# Patient Record
Sex: Male | Born: 1955 | Race: White | Hispanic: No | Marital: Married | Smoking: Never smoker
Health system: Southern US, Community
[De-identification: ages and names within clinical notes are randomized; demographics above are authoritative.]

## PROBLEM LIST (undated history)

## (undated) DIAGNOSIS — M1990 Unspecified osteoarthritis, unspecified site: Secondary | ICD-10-CM

## (undated) DIAGNOSIS — I214 Non-ST elevation (NSTEMI) myocardial infarction: Secondary | ICD-10-CM

## (undated) DIAGNOSIS — C419 Malignant neoplasm of bone and articular cartilage, unspecified: Secondary | ICD-10-CM

## (undated) DIAGNOSIS — C719 Malignant neoplasm of brain, unspecified: Secondary | ICD-10-CM

## (undated) DIAGNOSIS — I10 Essential (primary) hypertension: Secondary | ICD-10-CM

## (undated) DIAGNOSIS — I63219 Cerebral infarction due to unspecified occlusion or stenosis of unspecified vertebral arteries: Secondary | ICD-10-CM

## (undated) DIAGNOSIS — E119 Type 2 diabetes mellitus without complications: Secondary | ICD-10-CM

## (undated) DIAGNOSIS — I6322 Cerebral infarction due to unspecified occlusion or stenosis of basilar arteries: Secondary | ICD-10-CM

## (undated) HISTORY — DX: Type 2 diabetes mellitus without complications: E11.9

## (undated) HISTORY — PX: NO PAST SURGERIES: SHX2092

## (undated) HISTORY — DX: Cerebral infarction due to unspecified occlusion or stenosis of unspecified vertebral artery: I63.219

## (undated) HISTORY — DX: Cerebral infarction due to unspecified occlusion or stenosis of basilar artery: I63.22

## (undated) HISTORY — DX: Essential (primary) hypertension: I10

---
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
## 2014-12-05 DIAGNOSIS — C419 Malignant neoplasm of bone and articular cartilage, unspecified: Secondary | ICD-10-CM

## 2014-12-05 DIAGNOSIS — C719 Malignant neoplasm of brain, unspecified: Secondary | ICD-10-CM

## 2014-12-05 HISTORY — DX: Malignant neoplasm of brain, unspecified: C71.9

## 2014-12-05 HISTORY — DX: Malignant neoplasm of bone and articular cartilage, unspecified: C41.9

## 2015-07-11 ENCOUNTER — Encounter: Payer: Self-pay | Admitting: *Deleted

## 2015-07-11 ENCOUNTER — Inpatient Hospital Stay: Payer: Medicaid Other

## 2015-07-11 ENCOUNTER — Inpatient Hospital Stay
Admission: EM | Admit: 2015-07-11 | Discharge: 2015-07-12 | DRG: 065 | Disposition: A | Discharge status: Home / Self Care | Payer: Medicaid Other | Attending: Internal Medicine | Admitting: Internal Medicine

## 2015-07-11 ENCOUNTER — Emergency Department: Payer: Medicaid Other

## 2015-07-11 ENCOUNTER — Inpatient Hospital Stay
Admit: 2015-07-11 | Discharge: 2015-07-11 | Disposition: A | Discharge status: Home / Self Care | Payer: Medicaid Other | Attending: Internal Medicine | Admitting: Internal Medicine

## 2015-07-11 DIAGNOSIS — G8194 Hemiplegia, unspecified affecting left nondominant side: Secondary | ICD-10-CM | POA: Diagnosis present

## 2015-07-11 DIAGNOSIS — I639 Cerebral infarction, unspecified: Secondary | ICD-10-CM | POA: Diagnosis not present

## 2015-07-11 DIAGNOSIS — I638 Other cerebral infarction: Secondary | ICD-10-CM | POA: Diagnosis present

## 2015-07-11 DIAGNOSIS — Z809 Family history of malignant neoplasm, unspecified: Secondary | ICD-10-CM

## 2015-07-11 DIAGNOSIS — E785 Hyperlipidemia, unspecified: Secondary | ICD-10-CM | POA: Diagnosis present

## 2015-07-11 DIAGNOSIS — Z833 Family history of diabetes mellitus: Secondary | ICD-10-CM | POA: Diagnosis not present

## 2015-07-11 DIAGNOSIS — E669 Obesity, unspecified: Secondary | ICD-10-CM | POA: Diagnosis present

## 2015-07-11 DIAGNOSIS — I119 Hypertensive heart disease without heart failure: Secondary | ICD-10-CM | POA: Diagnosis present

## 2015-07-11 DIAGNOSIS — Z6835 Body mass index (BMI) 35.0-35.9, adult: Secondary | ICD-10-CM

## 2015-07-11 DIAGNOSIS — Z8249 Family history of ischemic heart disease and other diseases of the circulatory system: Secondary | ICD-10-CM | POA: Diagnosis not present

## 2015-07-11 DIAGNOSIS — M1990 Unspecified osteoarthritis, unspecified site: Secondary | ICD-10-CM | POA: Diagnosis present

## 2015-07-11 HISTORY — DX: Unspecified osteoarthritis, unspecified site: M19.90

## 2015-07-11 LAB — DIFFERENTIAL
BASOS ABS: 0.1 10*3/uL (ref 0–0.1)
Basophils Relative: 1 %
EOS PCT: 3 %
Eosinophils Absolute: 0.3 10*3/uL (ref 0–0.7)
LYMPHS ABS: 3.3 10*3/uL (ref 1.0–3.6)
Lymphocytes Relative: 35 %
Monocytes Absolute: 1.2 10*3/uL — ABNORMAL HIGH (ref 0.2–1.0)
Monocytes Relative: 13 %
NEUTROS PCT: 48 %
Neutro Abs: 4.6 10*3/uL (ref 1.4–6.5)

## 2015-07-11 LAB — COMPREHENSIVE METABOLIC PANEL
ALBUMIN: 3.7 g/dL (ref 3.5–5.0)
ALK PHOS: 72 U/L (ref 38–126)
ALT: 18 U/L (ref 17–63)
ANION GAP: 5 (ref 5–15)
AST: 19 U/L (ref 15–41)
BUN: 21 mg/dL — AB (ref 6–20)
CO2: 25 mmol/L (ref 22–32)
CREATININE: 0.75 mg/dL (ref 0.61–1.24)
Calcium: 8.6 mg/dL — ABNORMAL LOW (ref 8.9–10.3)
Chloride: 109 mmol/L (ref 101–111)
GFR calc Af Amer: >60 mL/min (ref >60)
GFR calc non Af Amer: >60 mL/min (ref >60)
Glucose, Bld: 204 mg/dL — ABNORMAL HIGH (ref 65–99)
Potassium: 4.1 mmol/L (ref 3.5–5.1)
Sodium: 139 mmol/L (ref 135–145)
Total Bilirubin: 0.4 mg/dL (ref 0.3–1.2)
Total Protein: 7.2 g/dL (ref 6.5–8.1)

## 2015-07-11 LAB — PROTIME-INR
INR: 0.93
PROTHROMBIN TIME: 12.7 s (ref 11.4–15.0)

## 2015-07-11 LAB — APTT: aPTT: 32 seconds (ref 24–36)

## 2015-07-11 LAB — CBC
HEMATOCRIT: 40.2 % (ref 40.0–52.0)
Hemoglobin: 14.2 g/dL (ref 13.0–18.0)
MCH: 31.1 pg (ref 26.0–34.0)
MCHC: 35.2 g/dL (ref 32.0–36.0)
MCV: 88.3 fL (ref 80.0–100.0)
Platelets: 217 10*3/uL (ref 150–440)
RBC: 4.55 MIL/uL (ref 4.40–5.90)
RDW: 13.5 % (ref 11.5–14.5)
WBC: 9.5 10*3/uL (ref 3.8–10.6)

## 2015-07-11 LAB — LIPID PANEL
CHOL/HDL RATIO: 6.4 ratio
Cholesterol: 218 mg/dL — ABNORMAL HIGH (ref 0–200)
HDL: 34 mg/dL — ABNORMAL LOW (ref >40)
LDL Cholesterol: 158 mg/dL — ABNORMAL HIGH (ref 0–99)
Triglycerides: 128 mg/dL (ref <150)
VLDL: 26 mg/dL (ref 0–40)

## 2015-07-11 LAB — TROPONIN I

## 2015-07-11 LAB — HEMOGLOBIN A1C: HEMOGLOBIN A1C: 7.5 % — AB (ref 4.0–6.0)

## 2015-07-11 MED ORDER — ATORVASTATIN CALCIUM 20 MG PO TABS
40.0000 mg | ORAL_TABLET | Freq: Every day | ORAL | Status: DC
  Administered 2015-07-11: 19:00:00 40 mg via ORAL

## 2015-07-11 MED ORDER — ASPIRIN 81 MG PO CHEW
324.0000 mg | CHEWABLE_TABLET | Freq: Once | ORAL | Status: AC
  Administered 2015-07-11: 324 mg via ORAL
  Filled 2015-07-11: qty 4

## 2015-07-11 MED ORDER — ASPIRIN 81 MG PO CHEW
325.0000 mg | CHEWABLE_TABLET | Freq: Every day | ORAL | Status: DC
  Filled 2015-07-11: qty 4

## 2015-07-11 MED ORDER — STROKE: EARLY STAGES OF RECOVERY BOOK
Freq: Once | Status: AC
  Administered 2015-07-11: 18:00:00

## 2015-07-11 NOTE — Plan of Care (Signed)
Problem: Discharge/Transitional Outcomes Goal: Other Discharge Outcomes/Goals Outcome: Progressing Plan of care progress to goal for:  1. Discharge Plan in Place and Appropriate:         Patient has Plans to return Home with Wife. 2. Barriers:         No Learning Barriers.         Supportive Family. 3. Educational Plan:         Stroke Teaching laboratory technician. 4. Hemodynamically Stable:         VSS.         Labs WDL.         Tolerating Fluids. 5. Independent Mobility/functioning independent or with min:         Ambulating Independently. 6. Diet:         Healthy Heart Diet. Tolerating Well.  7. INR Monitor Plan Established:         PT INR WDL.

## 2015-07-11 NOTE — ED Notes (Signed)
Pt back from MRI 

## 2015-07-11 NOTE — ED Notes (Signed)
Patient transported to MRI 

## 2015-07-11 NOTE — Progress Notes (Signed)
PT Cancellation Note  Patient Details Name: Jacob Gonzales MRN: [id number] DOB: Aug 05, 1956   Cancelled Treatment:    Reason Eval/Treat Not Completed: Patient declined, no reason specified.  Has just admitted and asked for PT to see in the AM.   Ramond Dial 07/11/2015, 2:41 PM   Mee Hives, PT MS Acute Rehab Dept. Number: [facility] [id number] and [place] [phone]

## 2015-07-11 NOTE — H&P (Addendum)
[facility] NAME: Jacob Gonzales    MR#:  [id number]  DATE OF BIRTH:  1956/02/20  DATE OF ADMISSION:  07/11/2015  PRIMARY CARE PHYSICIAN: No primary care provider on file.   REQUESTING/REFERRING PHYSICIAN: Schaevitz  CHIEF COMPLAINT:   Chief Complaint  Patient presents with  . Headache  . Numbness    HISTORY OF PRESENT ILLNESS: Jacob Gonzales  is a 59 y.o. male with a known history of No medical issues-  Came with left side numbness and weakness, since he woke up in morning, last night 11 he was completely at his baseline. He had similar episode 1  Month ago- wjich lasted for 20 min, so he thought this time it will go away, but it did n;t. In ER he is noted to have some atenuation  Given as admission for more work upw/  PAST MEDICAL HISTORY:   Past Medical History  Diagnosis Date  . Arthritis     Bilateral shoulders, Hands, Knees    PAST SURGICAL HISTORY: History reviewed. No pertinent past surgical history.  SOCIAL HISTORY:  History  Substance Use Topics  . Smoking status: Never Smoker   . Smokeless tobacco: Not on file  . Alcohol Use: No    FAMILY HISTORY:  Family History  Problem Relation Age of Onset  . Heart attack Mother   . Heart attack Father   . Diabetes Mellitus II Father   . Kidney failure Father   . Uterine cancer Sister   . Heart attack Brother   . Heart attack Maternal Aunt   . Heart attack Maternal Uncle   . Heart attack Paternal Aunt   . Heart attack Paternal Uncle     DRUG ALLERGIES: No Known Allergies  REVIEW OF SYSTEMS:   CONSTITUTIONAL: No fever, fatigue or weakness.  EYES: No blurred or double vision.  EARS, NOSE, AND THROAT: No tinnitus or ear pain.  RESPIRATORY: No cough, shortness of breath, wheezing or hemoptysis.  CARDIOVASCULAR: No chest pain, orthopnea, edema.  GASTROINTESTINAL: No nausea, vomiting, diarrhea or abdominal pain.  GENITOURINARY: No dysuria, hematuria.   ENDOCRINE: No polyuria, nocturia,  HEMATOLOGY: No anemia, easy bruising or bleeding SKIN: No rash or lesion. MUSCULOSKELETAL: No joint pain or arthritis.   NEUROLOGIC: No tingling, left sided UL and LL numbness, weakness.  PSYCHIATRY: No anxiety or depression.   MEDICATIONS AT HOME:  Prior to Admission medications   Medication Sig Start Date End Date Taking? Authorizing Provider  ibuprofen (ADVIL,MOTRIN) 200 MG tablet Take 1,000 mg by mouth daily.   Yes Historical Provider, MD      PHYSICAL EXAMINATION:   VITAL SIGNS: Blood pressure 169/88, pulse 64, temperature 97.6 F (36.4 C), temperature source Oral, resp. rate 18, height '5\' 10"'$  (1.778 m), weight 113.399 kg (250 lb), SpO2 100 %.  GENERAL:  59 y.o.-year-old patient lying in the bed with no acute distress.  EYES: Pupils equal, round, reactive to light and accommodation. No scleral icterus. Extraocular muscles intact.  HEENT: Head atraumatic, normocephalic. Oropharynx and nasopharynx clear.  NECK:  Supple, no jugular venous distention. No thyroid enlargement, no tenderness.  LUNGS: Normal breath sounds bilaterally, no wheezing, rales,rhonchi or crepitation. No use of accessory muscles of respiration.  CARDIOVASCULAR: S1, S2 normal. No murmurs, rubs, or gallops.  ABDOMEN: Soft, nontender, nondistended. Bowel sounds present. No organomegaly or mass.  EXTREMITIES: No pedal edema, cyanosis, or clubbing.  NEUROLOGIC: Cranial nerves II through XII are intact. Muscle strength 5/5 in  right side, and 4/5 on left side extremities. Sensation intact. Gait not checked.  PSYCHIATRIC: The patient is alert and oriented x 3.  SKIN: No obvious rash, lesion, or ulcer.   LABORATORY PANEL:   CBC  Recent Labs Lab 07/11/15 0816  WBC 9.5  HGB 14.2  HCT 40.2  PLT 217  MCV 88.3  MCH 31.1  MCHC 35.2  RDW 13.5  LYMPHSABS 3.3  MONOABS 1.2*  EOSABS 0.3  BASOSABS 0.1    ------------------------------------------------------------------------------------------------------------------  Chemistries   Recent Labs Lab 07/11/15 0816  NA 139  K 4.1  CL 109  CO2 25  GLUCOSE 204*  BUN 21*  CREATININE 0.75  CALCIUM 8.6*  AST 19  ALT 18  ALKPHOS 72  BILITOT 0.4   ------------------------------------------------------------------------------------------------------------------ estimated creatinine clearance is 130.1 mL/min (by C-G formula based on Cr of 0.75). ------------------------------------------------------------------------------------------------------------------ No results for input(s): TSH, T4TOTAL, T3FREE, THYROIDAB in the last 72 hours.  Invalid input(s): FREET3   Coagulation profile  Recent Labs Lab 07/11/15 0816  INR 0.93   ------------------------------------------------------------------------------------------------------------------- No results for input(s): DDIMER in the last 72 hours. -------------------------------------------------------------------------------------------------------------------  Cardiac Enzymes  Recent Labs Lab 07/11/15 0816  TROPONINI <0.03   ------------------------------------------------------------------------------------------------------------------ Invalid input(s): POCBNP  ---------------------------------------------------------------------------------------------------------------  Urinalysis No results found for: COLORURINE, APPEARANCEUR, LABSPEC, PHURINE, GLUCOSEU, HGBUR, BILIRUBINUR, KETONESUR, PROTEINUR, UROBILINOGEN, NITRITE, LEUKOCYTESUR   RADIOLOGY: Ct Head Wo Contrast  07/11/2015   CLINICAL DATA:  Pt had a knot on posterior rt side of head (pt denies fall or trauma says knot is just his bone), took ibuprofen and went to bed. Pt woke this am with left sided facial numbness and left side weakness. Pt did walk into department. Pt brought to the ed by his son. Pt is speaking in  full sentences. Pt denies hx of stroke or seizure.  EXAM: CT HEAD WITHOUT CONTRAST  TECHNIQUE: Contiguous axial images were obtained from the base of the skull through the vertex without intravenous contrast.  COMPARISON:  None.  FINDINGS: No evidence of transcortical infarct. No hemorrhage or extra-axial fluid. No hydrocephalus. Calvarium intact. 5 mm focus of low attenuation in the region of the external capsule on the right. Minimal low attenuation in the deep white matter otherwise. Mild atrophy.  IMPRESSION: 5 mm low-attenuation focus deep white matter on the right. Cannot exclude lacunar infarct of uncertain age. Otherwise no acute findings. These results were called by telephone at the time of interpretation on 07/11/2015 at 8:44 am to Dr. Lenise Arena , who verbally acknowledged these results.   Electronically Signed   By: Skipper Cliche M.D.   On: 07/11/2015 08:45   Mr Brain Wo Contrast  07/11/2015   CLINICAL DATA:  LEFT-sided numbness in the arm and leg. Stumbling. Headache. Patient awoke today with the symptoms.  EXAM: MRI HEAD WITHOUT CONTRAST  TECHNIQUE: Multiplanar, multiecho pulse sequences of the brain and surrounding structures were obtained without intravenous contrast.  COMPARISON:  CT head earlier today.  FINDINGS: The area of concern on CT represents a chronic RIGHT lentiform nucleus lacunar infarct. There is also a tiny chronic RIGHT thalamic infarct.  Superimposed on these changes is a 6 x 12 mm area of restricted diffusion in the RIGHT thalamus consistent with acute infarction. There is no associated hemorrhage, mass lesion, hydrocephalus, or extra-axial fluid.  Slight premature for age cerebral and cerebellar atrophy. Minor white matter disease, nonspecific. Normal midline structures. Flow voids are maintained.  Extracranial soft tissues unremarkable. Trace LEFT mastoid effusion without nasopharyngeal mass. No sinus air-fluid  level. Negative orbits.  IMPRESSION: Acute RIGHT thalamic  infarction accounting for the LEFT-sided sensory symptoms.  Chronic lacunar infarcts are superimposed.   Electronically Signed   By: Staci Righter M.D.   On: 07/11/2015 10:57   US Carotid Bilateral  07/11/2015   CLINICAL DATA:  Stroke, syncope  EXAM: BILATERAL CAROTID DUPLEX ULTRASOUND  TECHNIQUE: Pearline Cables scale imaging, color Doppler and duplex ultrasound were performed of bilateral carotid and vertebral arteries in the neck.  COMPARISON:  None  FINDINGS: Criteria: Quantification of carotid stenosis is based on velocity parameters that correlate the residual internal carotid diameter with NASCET-based stenosis levels, using the diameter of the distal internal carotid lumen as the denominator for stenosis measurement.  The following velocity measurements were obtained:  RIGHT  ICA:  92/37 cm/sec  CCA:  41/28 cm/sec  SYSTOLIC ICA/CCA RATIO:  1.2  DIASTOLIC ICA/CCA RATIO:  2.1  ECA:  91 cm/sec  LEFT  ICA:  122/53 cm/sec  CCA:  78/67 cm/sec  SYSTOLIC ICA/CCA RATIO:  1.4  DIASTOLIC ICA/CCA RATIO:  2.3  ECA:  129 cm/sec  RIGHT CAROTID ARTERY: Mild tortuosity within the RIGHT carotid system. Intimal thickening with minimal amount of hypoechoic plaque at RIGHT carotid bulb. Laminar flow on color Doppler imaging. Minimal spectral broadening proximal RIGHT ICA. No high velocity jets.  RIGHT VERTEBRAL ARTERY:  Patent, antegrade  LEFT CAROTID ARTERY: Mildly tortuous LEFT carotid system. Minimal intimal thickening and non shadowing plaque at LEFT carotid bulb. Laminar flow by color upper imaging with minimal spectral broadening at proximal LEFT ICA on waveform analysis. No high velocity jets.  LEFT VERTEBRAL ARTERY:  Patent, antegrade  IMPRESSION: Minimal intimal thickening and plaque formation at the carotid bulbs bilaterally.  No evidence of hemodynamically significant stenosis.   Electronically Signed   By: Lavonia Dana M.D.   On: 07/11/2015 19:14    EKG: Orders placed or performed during the hospital encounter of 07/11/15   . ED EKG  . ED EKG    IMPRESSION AND PLAN:  * CVA   Cardiac monitoring   Check Lipids, HBA1c   Check MRI,Echo and carotid doppler.   Give asa and statin.   PT and neurology eval.  * Hypertension   permisive due to new stroke,monitor.  * Obasity   councelled for weight loss.  All the records are reviewed and case discussed with ED provider. Management plans discussed with the patient, family and they are in agreement.  CODE STATUS:    Code Status Orders        Start     Ordered   07/11/15 1056  Full code   Continuous     07/11/15 1055       TOTAL TIME TAKING CARE OF THIS PATIENT: 50 minutes.    Vaughan Basta M.D on 07/11/2015   Between 7am to 6pm - Pager - [phone]  After 6pm go to [email or web address] - password [email or web address] [facility]  [facility]  Office  [phone]  CC: Primary care physician; No primary care provider on file.

## 2015-07-11 NOTE — ED Provider Notes (Addendum)
[facility] Emergency Department Provider Note  ____________________________________________  Time seen: Approximately 8:40 AM  I have reviewed the triage vital signs and the nursing notes.   HISTORY  Chief Complaint Headache and Numbness    HPI Jacob Gonzales is a 59 y.o. male without any medical problems who presents today with left-sided numbness and a stumbling gait. He said that he had a mild headache to the right occiput last night. He said he took some ibuprofen beforegoing to bed last night at 11 PM. He says that he woke this morning with left-sided numbness and a stumbling gait. No pain at this time. Says that he does not have any medical problems but is not into a doctor in many years. Does not take any medications at home.   No past medical history on file.  There are no active problems to display for this patient.   No past surgical history on file.  No current outpatient prescriptions on file.  Allergies Review of patient's allergies indicates no known allergies.  No family history on file.  Social History History  Substance Use Topics  . Smoking status: Never Smoker   . Smokeless tobacco: Not on file  . Alcohol Use: Yes    Review of Systems Constitutional: No fever/chills Eyes: No visual changes. ENT: No sore throat. Cardiovascular: Denies chest pain. Respiratory: Denies shortness of breath. Gastrointestinal: No abdominal pain.  No nausea, no vomiting.  No diarrhea.  No constipation. Genitourinary: Negative for dysuria. Musculoskeletal: Negative for back pain. Skin: Negative for rash. Neurological: As above  10-point ROS otherwise negative.  ____________________________________________   PHYSICAL EXAM:  VITAL SIGNS: ED Triage Vitals  Enc Vitals Group     BP 07/11/15 0808 183/97 mmHg     Pulse Rate 07/11/15 0808 71     Resp 07/11/15 0808 17     Temp 07/11/15 0808 98 F (36.7 C)     Temp Source 07/11/15 0808 Oral    SpO2 07/11/15 0808 98 %     Weight 07/11/15 0808 250 lb (113.399 kg)     Height 07/11/15 0808 '5\' 10"'$  (1.778 m)     Head Cir --      Peak Flow --      Pain Score --      Pain Loc --      Pain Edu? --      Excl. in [place]? --     Constitutional: Alert and oriented. Well appearing and in no acute distress. Eyes: Conjunctivae are normal. PERRL. EOMI. Head: Atraumatic. Nose: No congestion/rhinnorhea. Mouth/Throat: Mucous membranes are moist.  Oropharynx non-erythematous. Neck: No stridor.   Cardiovascular: Normal rate, regular rhythm. Grossly normal heart sounds.  Good peripheral circulation. Respiratory: Normal respiratory effort.  No retractions. Lungs CTAB. Gastrointestinal: Soft and nontender. No distention. No abdominal bruits. No CVA tenderness. Musculoskeletal: No lower extremity tenderness nor edema.  No joint effusions. Neurologic:  Normal speech and language. Decreased sensation to light touch to the left side of the face as well as the left upper extremity left lower extremity. No ataxia on finger to nose testing however does have slowed movement of the left upper extremity on finger to nose testing. Also with a stumbling gait. Needs assistance to walk. Skin:  Skin is warm, dry and intact. No rash noted. Psychiatric: Mood and affect are normal. Speech and behavior are normal.  ____________________________________________   LABS (all labs ordered are listed, but only abnormal results are displayed)  Labs Reviewed  DIFFERENTIAL -  Abnormal; Notable for the following:    Monocytes Absolute 1.2 (*)    All other components within normal limits  PROTIME-INR  APTT  CBC  COMPREHENSIVE METABOLIC PANEL  TROPONIN I   ____________________________________________  EKG  ED ECG REPORT I, [place] Every,  Youlanda Roys, the attending physician, personally viewed and interpreted this ECG.   Date: 07/11/2015  EKG Time: 808  Rate: 68  Rhythm: normal sinus rhythm  Axis: Normal axis   Intervals:Incomplete right bundle branch block  ST&T Change: No ST elevations or depressions. No abnormal T-wave inversions.  ____________________________________________  RADIOLOGY  CAT scan of the brain with 5 mm low attenuation focus deep white matter on the right. Cannot exclude lacunar infarct of uncertain age. ____________________________________________   PROCEDURES  Patient is out of window for TPA. His last time that he was without his neurologic findings was 11 PM last night.  ____________________________________________   INITIAL IMPRESSION / ASSESSMENT AND PLAN / ED COURSE  Pertinent labs & imaging results that were available during my care of the patient were reviewed by me and considered in my medical decision making (see chart for details).  ----------------------------------------- 9:06 AM on 07/11/2015 -----------------------------------------  Due to exam findings as well as CAT scan findings will admit the patient for CVA workup. Signed out to Dr. Anselm Jungling. Patient and son aware of results and agree with plan. Signed out to Dr. Anselm Jungling. ____________________________________________   FINAL CLINICAL IMPRESSION(S) / ED DIAGNOSES  Acute CVA. Initial visit.    Orbie Pyo, MD 07/11/15 [phone]  NIH Stroke Scale   Time: 840AM Person Administering Scale: Doran Stabler  Administer stroke scale items in the order listed. Record performance in each category after each subscale exam. Do not go back and change scores. Follow directions provided for each exam technique. Scores should reflect what the patient does, not what the clinician thinks the patient can do. The clinician should record answers while administering the exam and work quickly. Except where indicated, the patient should not be coached (i.e., repeated requests to patient to make a special effort).   1a  Level of consciousness: 0=alert; keenly responsive  1b. LOC questions:   0=Performs both tasks correctly  1c. LOC commands: 0=Performs both tasks correctly  2.  Best Gaze: 0=normal  3.  Visual: 0=No visual loss  4. Facial Palsy: 0=Normal symmetric movement  5a.  Motor left arm: 0=No drift, limb holds 90 (or 45) degrees for full 10 seconds  5b.  Motor right arm: 0=No drift, limb holds 90 (or 45) degrees for full 10 seconds  6a. motor left leg: 0=No drift, limb holds 90 (or 45) degrees for full 10 seconds  6b  Motor right leg:  0=No drift, limb holds 90 (or 45) degrees for full 10 seconds  7. Limb Ataxia: 0=Absent  8.  Sensory: 1=Mild to moderate sensory loss; patient feels pinprick is less sharp or is dull on the affected side; there is a loss of superficial pain with pinprick but patient is aware He is being touched  9. Best Language:  0=No aphasia, normal  10. Dysarthria: 0=Normal  11. Extinction and Inattention: 0=No abnormality  12. Distal motor function: 0=Normal   Total:   1    Orbie Pyo, MD 07/11/15 1005

## 2015-07-11 NOTE — ED Notes (Signed)
Ed md and charge made aware

## 2015-07-11 NOTE — ED Notes (Addendum)
Pt resting in wheel chair in nad. Son present pt conversant with his son.

## 2015-07-11 NOTE — ED Notes (Signed)
Pt had a knot on posterior rt side of head, took ibuprofen and went to bed. Pt woke this am with left sided facial numbness and left side weakness. Pt did walk into department. Pt brought to the ed by his son. Pt is speaking in full sentences.

## 2015-07-12 DIAGNOSIS — I63312 Cerebral infarction due to thrombosis of left middle cerebral artery: Secondary | ICD-10-CM

## 2015-07-12 LAB — BASIC METABOLIC PANEL
ANION GAP: 7 (ref 5–15)
BUN: 15 mg/dL (ref 6–20)
CO2: 26 mmol/L (ref 22–32)
Calcium: 8.6 mg/dL — ABNORMAL LOW (ref 8.9–10.3)
Chloride: 104 mmol/L (ref 101–111)
Creatinine, Ser: 0.66 mg/dL (ref 0.61–1.24)
GFR calc Af Amer: >60 mL/min (ref >60)
Glucose, Bld: 127 mg/dL — ABNORMAL HIGH (ref 65–99)
Potassium: 3.7 mmol/L (ref 3.5–5.1)
Sodium: 137 mmol/L (ref 135–145)

## 2015-07-12 LAB — CBC
HCT: 39.7 % — ABNORMAL LOW (ref 40.0–52.0)
Hemoglobin: 14 g/dL (ref 13.0–18.0)
MCH: 31.2 pg (ref 26.0–34.0)
MCHC: 35.3 g/dL (ref 32.0–36.0)
MCV: 88.3 fL (ref 80.0–100.0)
Platelets: 201 10*3/uL (ref 150–440)
RBC: 4.5 MIL/uL (ref 4.40–5.90)
RDW: 13.3 % (ref 11.5–14.5)
WBC: 10.2 10*3/uL (ref 3.8–10.6)

## 2015-07-12 MED ORDER — ATORVASTATIN CALCIUM 40 MG PO TABS: 40.0000 mg | ORAL_TABLET | Freq: Every day | ORAL | Status: DC

## 2015-07-12 MED ORDER — LISINOPRIL 10 MG PO TABS
10.0000 mg | ORAL_TABLET | Freq: Every day | ORAL | Status: DC
  Administered 2015-07-12: 11:00:00 10 mg via ORAL
  Filled 2015-07-12: qty 1

## 2015-07-12 MED ORDER — ASPIRIN 81 MG PO CHEW
81.0000 mg | CHEWABLE_TABLET | Freq: Every day | ORAL | Status: DC
  Administered 2015-07-12: 11:00:00 81 mg via ORAL
  Filled 2015-07-12: qty 1

## 2015-07-12 MED ORDER — LISINOPRIL 10 MG PO TABS: 10.0000 mg | ORAL_TABLET | Freq: Every day | ORAL | Status: DC

## 2015-07-12 MED ORDER — ASPIRIN 81 MG PO CHEW: 81.0000 mg | CHEWABLE_TABLET | Freq: Every day | ORAL | Status: DC

## 2015-07-12 NOTE — Consult Note (Signed)
Chief Complaint: L sided nubmness  HPI: Jacob Gonzales is an 59 y.o. male  known history of HTN no medications caame with left side numbness and weakness, since he woke up in morning. Pt found ischemic stroke in R thalamus. No prior hx of similar symptoms. Strength improved but still complaining of numbness on LUE and LLE. Was not on any antiplatelet therapy.     Past Medical History  Diagnosis Date  . Arthritis     Bilateral shoulders, Hands, Knees    History reviewed. No pertinent past surgical history.  Family History  Problem Relation Age of Onset  . Heart attack Mother   . Heart attack Father   . Diabetes Mellitus II Father   . Kidney failure Father   . Uterine cancer Sister   . Heart attack Brother   . Heart attack Maternal Aunt   . Heart attack Maternal Uncle   . Heart attack Paternal Aunt   . Heart attack Paternal Uncle    Social History:  reports that he has never smoked. He does not have any smokeless tobacco history on file. He reports that he does not drink alcohol or use illicit drugs.  Allergies: No Known Allergies  Medications: I have reviewed the patient's current medications.  ROS: History obtained from the patient  General ROS: negative for - chills, fatigue, fever, night sweats, weight gain or weight loss Psychological ROS: negative for - behavioral disorder, hallucinations, memory difficulties, mood swings or suicidal ideation Ophthalmic ROS: negative for - blurry vision, double vision, eye pain or loss of vision ENT ROS: negative for - epistaxis, nasal discharge, oral lesions, sore throat, tinnitus or vertigo Allergy and Immunology ROS: negative for - hives or itchy/watery eyes Hematological and Lymphatic ROS: negative for - bleeding problems, bruising or swollen lymph nodes Endocrine ROS: negative for - galactorrhea, hair pattern changes, polydipsia/polyuria or temperature intolerance Respiratory ROS: negative for - cough, hemoptysis, shortness of  breath or wheezing Cardiovascular ROS: negative for - chest pain, dyspnea on exertion, edema or irregular heartbeat Gastrointestinal ROS: negative for - abdominal pain, diarrhea, hematemesis, nausea/vomiting or stool incontinence Genito-Urinary ROS: negative for - dysuria, hematuria, incontinence or urinary frequency/urgency Musculoskeletal ROS: negative for - joint swelling or muscular weakness Neurological ROS: as noted in HPI Dermatological ROS: negative for rash and skin lesion changes  Physical Examination: Blood pressure 160/76, pulse 77, temperature 98.6 F (37 C), temperature source Oral, resp. rate 18, height '5\' 10"'$  (1.778 m), weight 113.399 kg (250 lb), SpO2 98 %.    Neurological Examination Mental Status: Alert, oriented, thought content appropriate.  Speech fluent without evidence of aphasia.  Able to follow 3 step commands without difficulty. Cranial Nerves: II: Discs flat bilaterally; Visual fields grossly normal, pupils equal, round, reactive to light and accommodation III,IV, VI: ptosis not present, extra-ocular motions intact bilaterally V,VII: smile symmetric, facial light touch sensation normal bilaterally VIII: hearing normal bilaterally IX,X: gag reflex present XI: bilateral shoulder shrug XII: midline tongue extension Motor: Right : Upper extremity   5/5    Left:     Upper extremity   4+/5  Lower extremity   5/5     Lower extremity   4+/5 Tone and bulk:normal tone throughout; no atrophy noted Sensory: decreased sensation to light touch and temperature on L side.  Deep Tendon Reflexes: 1+ and symmetric throughout Plantars: Right: downgoing   Left: downgoing Cerebellar: normal finger-to-nose, normal rapid alternating movements and normal heel-to-shin test Gait: not tested  Laboratory Studies:  Basic Metabolic Panel:  Recent Labs Lab 07/11/15 0816 07/12/15 0421  NA 139 137  K 4.1 3.7  CL 109 104  CO2 25 26  GLUCOSE 204* 127*  BUN 21* 15   CREATININE 0.75 0.66  CALCIUM 8.6* 8.6*    Liver Function Tests:  Recent Labs Lab 07/11/15 0816  AST 19  ALT 18  ALKPHOS 72  BILITOT 0.4  PROT 7.2  ALBUMIN 3.7   No results for input(s): LIPASE, AMYLASE in the last 168 hours. No results for input(s): AMMONIA in the last 168 hours.  CBC:  Recent Labs Lab 07/11/15 0816 07/12/15 0421  WBC 9.5 10.2  NEUTROABS 4.6  --   HGB 14.2 14.0  HCT 40.2 39.7*  MCV 88.3 88.3  PLT 217 201    Cardiac Enzymes:  Recent Labs Lab 07/11/15 0816  TROPONINI <0.03    BNP: Invalid input(s): POCBNP  CBG: No results for input(s): GLUCAP in the last 168 hours.  Microbiology: No results found for this or any previous visit.  Coagulation Studies:  Recent Labs  07/11/15 0816  LABPROT 12.7  INR 0.93    Urinalysis: No results for input(s): COLORURINE, LABSPEC, PHURINE, GLUCOSEU, HGBUR, BILIRUBINUR, KETONESUR, PROTEINUR, UROBILINOGEN, NITRITE, LEUKOCYTESUR in the last 168 hours.  Invalid input(s): APPERANCEUR  Lipid Panel:    Component Value Date/Time   CHOL 218* 07/11/2015 0816   TRIG 128 07/11/2015 0816   HDL 34* 07/11/2015 0816   CHOLHDL 6.4 07/11/2015 0816   VLDL 26 07/11/2015 0816   LDLCALC 158* 07/11/2015 0816    HgbA1C:  Lab Results  Component Value Date   HGBA1C 7.5* 07/11/2015    Urine Drug Screen:  No results found for: LABOPIA, COCAINSCRNUR, LABBENZ, AMPHETMU, THCU, LABBARB  Alcohol Level: No results for input(s): ETH in the last 168 hours.  Other results: EKG: normal EKG, normal sinus rhythm, unchanged from previous tracings.  Imaging: Ct Head Wo Contrast  07/11/2015   CLINICAL DATA:  Pt had a knot on posterior rt side of head (pt denies fall or trauma says knot is just his bone), took ibuprofen and went to bed. Pt woke this am with left sided facial numbness and left side weakness. Pt did walk into department. Pt brought to the ed by his son. Pt is speaking in full sentences. Pt denies hx of stroke or  seizure.  EXAM: CT HEAD WITHOUT CONTRAST  TECHNIQUE: Contiguous axial images were obtained from the base of the skull through the vertex without intravenous contrast.  COMPARISON:  None.  FINDINGS: No evidence of transcortical infarct. No hemorrhage or extra-axial fluid. No hydrocephalus. Calvarium intact. 5 mm focus of low attenuation in the region of the external capsule on the right. Minimal low attenuation in the deep white matter otherwise. Mild atrophy.  IMPRESSION: 5 mm low-attenuation focus deep white matter on the right. Cannot exclude lacunar infarct of uncertain age. Otherwise no acute findings. These results were called by telephone at the time of interpretation on 07/11/2015 at 8:44 am to Dr. Lenise Arena , who verbally acknowledged these results.   Electronically Signed   By: Skipper Cliche M.D.   On: 07/11/2015 08:45   Mr Brain Wo Contrast  07/11/2015   CLINICAL DATA:  LEFT-sided numbness in the arm and leg. Stumbling. Headache. Patient awoke today with the symptoms.  EXAM: MRI HEAD WITHOUT CONTRAST  TECHNIQUE: Multiplanar, multiecho pulse sequences of the brain and surrounding structures were obtained without intravenous contrast.  COMPARISON:  CT head  earlier today.  FINDINGS: The area of concern on CT represents a chronic RIGHT lentiform nucleus lacunar infarct. There is also a tiny chronic RIGHT thalamic infarct.  Superimposed on these changes is a 6 x 12 mm area of restricted diffusion in the RIGHT thalamus consistent with acute infarction. There is no associated hemorrhage, mass lesion, hydrocephalus, or extra-axial fluid.  Slight premature for age cerebral and cerebellar atrophy. Minor white matter disease, nonspecific. Normal midline structures. Flow voids are maintained.  Extracranial soft tissues unremarkable. Trace LEFT mastoid effusion without nasopharyngeal mass. No sinus air-fluid level. Negative orbits.  IMPRESSION: Acute RIGHT thalamic infarction accounting for the LEFT-sided  sensory symptoms.  Chronic lacunar infarcts are superimposed.   Electronically Signed   By: Staci Righter M.D.   On: 07/11/2015 10:57   US Carotid Bilateral  07/11/2015   CLINICAL DATA:  Stroke, syncope  EXAM: BILATERAL CAROTID DUPLEX ULTRASOUND  TECHNIQUE: Pearline Cables scale imaging, color Doppler and duplex ultrasound were performed of bilateral carotid and vertebral arteries in the neck.  COMPARISON:  None  FINDINGS: Criteria: Quantification of carotid stenosis is based on velocity parameters that correlate the residual internal carotid diameter with NASCET-based stenosis levels, using the diameter of the distal internal carotid lumen as the denominator for stenosis measurement.  The following velocity measurements were obtained:  RIGHT  ICA:  92/37 cm/sec  CCA:  63/87 cm/sec  SYSTOLIC ICA/CCA RATIO:  1.2  DIASTOLIC ICA/CCA RATIO:  2.1  ECA:  91 cm/sec  LEFT  ICA:  122/53 cm/sec  CCA:  56/43 cm/sec  SYSTOLIC ICA/CCA RATIO:  1.4  DIASTOLIC ICA/CCA RATIO:  2.3  ECA:  129 cm/sec  RIGHT CAROTID ARTERY: Mild tortuosity within the RIGHT carotid system. Intimal thickening with minimal amount of hypoechoic plaque at RIGHT carotid bulb. Laminar flow on color Doppler imaging. Minimal spectral broadening proximal RIGHT ICA. No high velocity jets.  RIGHT VERTEBRAL ARTERY:  Patent, antegrade  LEFT CAROTID ARTERY: Mildly tortuous LEFT carotid system. Minimal intimal thickening and non shadowing plaque at LEFT carotid bulb. Laminar flow by color upper imaging with minimal spectral broadening at proximal LEFT ICA on waveform analysis. No high velocity jets.  LEFT VERTEBRAL ARTERY:  Patent, antegrade  IMPRESSION: Minimal intimal thickening and plaque formation at the carotid bulbs bilaterally.  No evidence of hemodynamically significant stenosis.   Electronically Signed   By: Lavonia Dana M.D.   On: 07/11/2015 19:14     Assessment/Plan:  59 y.o. male  known history of HTN no medications caame with left side numbness and weakness,  since he woke up in morning. Pt found ischemic stroke in R thalamus. No prior hx of similar symptoms. Strength improved but still complaining of numbness on LUE and LLE. Was not on any antiplatelet therapy.    - ASA and statin daily. Likely small vessel dz in setting of HTN - 2d echo, Carotid - lipid panel - after above complete d/c planning and f/up neurology 4-6 weeks.  Leotis Pain  07/12/2015, 11:49 AM

## 2015-07-12 NOTE — Discharge Instructions (Signed)
Ischemic Stroke Blood carries oxygen to all areas of your body. A stroke happens when your blood does not flow to your brain like normal. If this happens, your brain will not get the oxygen it needs and brain tissue will die. This is an emergency. Problems (symptoms) of a stroke usually happen suddenly. You may notice them when you wake up. They can include:  Loss of feeling or weakness on one side of the body (face, arm, leg).  Feeling confused.  Trouble talking or understanding.  Trouble seeing.  Trouble walking.  Feeling dizzy.  Loss of balance or coordination.  Severe headache without a cause.  Trouble reading or writing. Get help as soon as any of these problems first start. This is important.  RISK FACTORS  Risk factors are things that make it more likely for you to have a stroke. These things include:  High blood pressure (hypertension).  High cholesterol.  Diabetes.  Heart disease.  Having a buildup of fatty deposits in the blood vessels.  Having an abnormal heart rhythm (atrial fibrillation).  Being very overweight (obese).  Smoking.  Taking birth control pills, especially if you smoke.  **Not being active.  **Having a diet high in fats, salt, and calories.  Drinking too much alcohol.  Using illegal drugs.  Being African American.  Being over the age of 55.  Having a family history of stroke.  Having a history of blood clots, stroke, warning stroke (transient ischemic attack, TIA), or heart attack.  Sickle cell disease. HOME CARE  Take all medicines exactly as told by your doctor. Understand all your medicine instructions.  You may need to take a medicine to thin your blood, like aspirin or warfarin. Take warfarin exactly as told.  Taking too much or too little warfarin is dangerous. Get regular blood tests as told, including the PT and INR tests. The test results help your doctor adjust your dose of warfarin. Your PT and INR levels must be  done as often as told by your doctor.  Food can cause problems with warfarin and affect the results of your blood tests. This is true for foods high in vitamin K, such as spinach, kale, broccoli, cabbage, collard and turnip greens, Brussels sprouts, peas, cauliflower, seaweed, and parsley, as well as beef and pork liver, green tea, and soybean oil. Eat the same amount of food high in vitamin K. Avoid major changes in your diet. Tell your doctor before changing your diet. Talk to a food specialist (dietitian) if you have questions.  Many medicines can cause problems with warfarin and affect your PT and INR test results. Tell your doctor about all medicines you take. This includes vitamins and dietary pills (supplements). Be careful with aspirin and medicines that relieve redness, soreness, and puffiness (inflammation). Do not take or stop medicines unless your doctor tells you to.  Warfarin can cause a lot of bruising or bleeding. Hold pressure over cuts for longer than normal. Talk to your doctor about other side effects of warfarin.  Avoid sports or activities that may cause injury or bleeding.  Be careful when you shave, floss your teeth, or use sharp objects.  Avoid alcoholic drinks or drink very little alcohol while taking warfarin. Tell your doctor if you change how much alcohol you drink.  Tell your dentist and other doctors that you take warfarin before procedures.  If you are able to swallow, eat healthy foods. Eat 5 or more servings of fruits and vegetables a day. Eat soft   foods, pureed foods, or eat small bites of food so you do not choke.  Follow your diet program as told, if you are given one.  Keep a healthy weight.  Stay active. Try to get at least 30 minutes of activity on most or all days.  Do not smoke.  Limit how much alcohol you drink even if you are not taking warfarin. Moderate alcohol use is:  No more than 2 drinks each day for men.  No more than 1 drink each day  for women who are not pregnant.  Keep your home safe so you do not fall. Try:  Putting grab bars in the bedroom and bathroom.  Raising toilet seats.  Putting a seat in the shower.  Go to therapy sessions (physical, occupational, and speech) as told by your doctor.  Use a walker or cane at all times if told to do so.  Keep all doctor visits as told. GET HELP IF:  Your personality changes.  You have trouble swallowing.  You are seeing two of everything.  You are dizzy.  You have a fever.  Your skin starts to break down. GET HELP RIGHT AWAY IF:  The symptoms below may be a sign of an emergency. Do not wait to see if the symptoms go away. Call for help (911 in [place] Do not drive yourself to the hospital.  You have sudden weakness or numbness on the face, arm, or leg (especially on one side of the body).  You have sudden trouble walking or moving your arms or legs.  You have sudden confusion.  You have trouble talking or understanding.  You have sudden trouble seeing in one or both eyes.  You lose your balance or your movements are not smooth.  You have a sudden, severe headache with no known cause.  You have new chest pain or you feel your heart beating in an unsteady way.  You are partly or totally unaware of what is going on around you. Document Released: 11/10/2011 Document Revised: 04/07/2014 Document Reviewed: 07/01/2012 [employer] Patient Information 2015 [employer]. This information is not intended to replace advice given to you by your health care provider. Make sure you discuss any questions you have with your health care provider.  

## 2015-07-12 NOTE — Care Management Note (Addendum)
Case Management Note  Patient Details  Name: Jacob Gonzales MRN: [id number] Date of Birth: Jan 30, 1956  Subjective/Objective:            Discussed discharge planning with uninsured Jacob Gonzales who denies having seen any physician within the past 4 years. He reports that he has a rolling walker at home. This Probation officer explained to Jacob Gonzales that no home health agency will accept a patient without a physician to be on-call to write orders and because he has no surgeon or PCP that he will not be eligible for home health services. Strongly encouraged Jacob Gonzales to establish himself with a local doctor or clinic. Jacob Gonzales states that his wife gets disability and is trying to get him on her insurance. Provided Jacob Gonzales with a list of local medical clinics, a medication discount coupon, and an application to the [facility]. Have paged Dr Anselm Jungling twice in an attempt to update him.       Action/Plan:   Expected Discharge Date:                  Expected Discharge Plan:     In-House Referral:     Discharge planning Services     Post Acute Care Choice:    Choice offered to:     DME Arranged:    DME Agency:     HH Arranged:    [place] Agency:     Status of Service:     Medicare Important Message Given:    Date Medicare IM Given:    Medicare IM give by:    Date Additional Medicare IM Given:    Additional Medicare Important Message give by:     If discussed at [place] of Stay Meetings, dates discussed:    Additional Comments:  Arik Husmann A, RN 07/12/2015, 11:33 AM

## 2015-07-12 NOTE — Progress Notes (Signed)
Pt is alert and oriented x 4, denies pain, sleeping throughout shift, up to bathroom independently, vss, pt is d/c to home, patient anxious to leave and has little interest in nursing care and stroke education, pt d/c with Rx for aspirin, lisinopril and atorvastatin. Reviewed with patient when and why to take medications. Pt d/c to home via friend, pushed to visitor entrance via nursing staff. Pt reports understanding of d/c instructions and has no further questions at this time. Uneventful shift.

## 2015-07-12 NOTE — Discharge Summary (Signed)
[facility] NAME: Jacob Gonzales    MR#:  [id number]  DATE OF BIRTH:  [birth year]  DATE OF ADMISSION:  07/11/2015 ADMITTING PHYSICIAN: Vaughan Basta, MD  DATE OF DISCHARGE:07/12/2015  PRIMARY CARE PHYSICIAN: No primary care provider on file.    ADMISSION DIAGNOSIS:  CVA (cerebral infarction) [I63.9] Cerebral infarction due to unspecified mechanism [I63.9]  DISCHARGE DIAGNOSIS:  Principal Problem:   CVA (cerebral infarction)   Hypertensive cardiomyopathy  SECONDARY DIAGNOSIS:   Past Medical History  Diagnosis Date  . Arthritis     Bilateral shoulders, Hands, Knees    HOSPITAL COURSE:   * CVA-   Councelled for medical compliance- a he did not had PMD.   ASA, Statin started, started BP meds.  Need HOme health and rolling walker per PT>  * Hypertensive cardiomyopathy   Have EF 40-45%   Started on lisinopril- follow with PMD>  * Hyperlipidmeia   Started atorvastatin  * high HBA1c   Councelled about diet control and follow with PMD>  DISCHARGE CONDITIONS:   Stable.  CONSULTS OBTAINED:  Treatment Team:  Leotis Pain, MD  DRUG ALLERGIES:  No Known Allergies  DISCHARGE MEDICATIONS:   Current Discharge Medication List    START taking these medications   Details  aspirin 81 MG chewable tablet Chew 1 tablet (81 mg total) by mouth daily. Qty: 30 tablet, Refills: 3    atorvastatin (LIPITOR) 40 MG tablet Take 1 tablet (40 mg total) by mouth daily at 6 PM. Qty: 30 tablet, Refills: 3    lisinopril (PRINIVIL,ZESTRIL) 10 MG tablet Take 1 tablet (10 mg total) by mouth daily. Qty: 30 tablet, Refills: 3      CONTINUE these medications which have NOT CHANGED   Details  ibuprofen (ADVIL,MOTRIN) 200 MG tablet Take 1,000 mg by mouth daily.         DISCHARGE INSTRUCTIONS:    Follow with PMD in 1-2 weeks./  If you experience worsening of your admission symptoms, develop shortness of breath, life  threatening emergency, suicidal or homicidal thoughts you must seek medical attention immediately by calling 911 or calling your MD immediately  if symptoms less severe.  You Must read complete instructions/literature along with all the possible adverse reactions/side effects for all the Medicines you take and that have been prescribed to you. Take any new Medicines after you have completely understood and accept all the possible adverse reactions/side effects.   Please note  You were cared for by a hospitalist during your hospital stay. If you have any questions about your discharge medications or the care you received while you were in the hospital after you are discharged, you can call the unit and asked to speak with the hospitalist on call if the hospitalist that took care of you is not available. Once you are discharged, your primary care physician will handle any further medical issues. Please note that NO REFILLS for any discharge medications will be authorized once you are discharged, as it is imperative that you return to your primary care physician (or establish a relationship with a primary care physician if you do not have one) for your aftercare needs so that they can reassess your need for medications and monitor your lab values.    Today   CHIEF COMPLAINT:   Chief Complaint  Patient presents with  . Headache  . Numbness    HISTORY OF PRESENT ILLNESS:  Jacob Gonzales  is a 59 y.o. male with  a known history of No medical issues- Came with left side numbness and weakness, since he woke up in morning, last night 11 he was completely at his baseline. He had similar episode 1 Month ago- wjich lasted for 20 min, so he thought this time it will go away, but it did n;t. In ER he is noted to have some atenuation Given as admission for more work upw/  VITAL SIGNS:  Blood pressure 160/76, pulse 77, temperature 98.6 F (37 C), temperature source Oral, resp. rate 18, height '5\' 10"'$  (1.778  m), weight 113.399 kg (250 lb), SpO2 98 %.  I/O:   Intake/Output Summary (Last 24 hours) at 07/12/15 1139 Last data filed at 07/12/15 0800  Gross per 24 hour  Intake    480 ml  Output      0 ml  Net    480 ml    PHYSICAL EXAMINATION:  GENERAL: 59 y.o.-year-old patient lying in the bed with no acute distress.  EYES: Pupils equal, round, reactive to light and accommodation. No scleral icterus. Extraocular muscles intact.  HEENT: Head atraumatic, normocephalic. Oropharynx and nasopharynx clear.  NECK: Supple, no jugular venous distention. No thyroid enlargement, no tenderness.  LUNGS: Normal breath sounds bilaterally, no wheezing, rales,rhonchi or crepitation. No use of accessory muscles of respiration.  CARDIOVASCULAR: S1, S2 normal. No murmurs, rubs, or gallops.  ABDOMEN: Soft, nontender, nondistended. Bowel sounds present. No organomegaly or mass.  EXTREMITIES: No pedal edema, cyanosis, or clubbing.  NEUROLOGIC: Cranial nerves II through XII are intact. Muscle strength 5/5 in right side, and 4/5 on left side extremities. Sensation intact. Gait not checked.  PSYCHIATRIC: The patient is alert and oriented x 3.  SKIN: No obvious rash, lesion, or ulcer.   DATA REVIEW:   CBC  Recent Labs Lab 07/12/15 0421  WBC 10.2  HGB 14.0  HCT 39.7*  PLT 201    Chemistries   Recent Labs Lab 07/11/15 0816 07/12/15 0421  NA 139 137  K 4.1 3.7  CL 109 104  CO2 25 26  GLUCOSE 204* 127*  BUN 21* 15  CREATININE 0.75 0.66  CALCIUM 8.6* 8.6*  AST 19  --   ALT 18  --   ALKPHOS 72  --   BILITOT 0.4  --     Cardiac Enzymes  Recent Labs Lab 07/11/15 0816  TROPONINI <0.03    Microbiology Results  No results found for this or any previous visit.  RADIOLOGY:  Ct Head Wo Contrast  07/11/2015   CLINICAL DATA:  Pt had a knot on posterior rt side of head (pt denies fall or trauma says knot is just his bone), took ibuprofen and went to bed. Pt woke this am with left sided  facial numbness and left side weakness. Pt did walk into department. Pt brought to the ed by his son. Pt is speaking in full sentences. Pt denies hx of stroke or seizure.  EXAM: CT HEAD WITHOUT CONTRAST  TECHNIQUE: Contiguous axial images were obtained from the base of the skull through the vertex without intravenous contrast.  COMPARISON:  None.  FINDINGS: No evidence of transcortical infarct. No hemorrhage or extra-axial fluid. No hydrocephalus. Calvarium intact. 5 mm focus of low attenuation in the region of the external capsule on the right. Minimal low attenuation in the deep white matter otherwise. Mild atrophy.  IMPRESSION: 5 mm low-attenuation focus deep white matter on the right. Cannot exclude lacunar infarct of uncertain age. Otherwise no acute findings. These results were called  by telephone at the time of interpretation on 07/11/2015 at 8:44 am to Dr. Lenise Arena , who verbally acknowledged these results.   Electronically Signed   By: Skipper Cliche M.D.   On: 07/11/2015 08:45   Mr Brain Wo Contrast  07/11/2015   CLINICAL DATA:  LEFT-sided numbness in the arm and leg. Stumbling. Headache. Patient awoke today with the symptoms.  EXAM: MRI HEAD WITHOUT CONTRAST  TECHNIQUE: Multiplanar, multiecho pulse sequences of the brain and surrounding structures were obtained without intravenous contrast.  COMPARISON:  CT head earlier today.  FINDINGS: The area of concern on CT represents a chronic RIGHT lentiform nucleus lacunar infarct. There is also a tiny chronic RIGHT thalamic infarct.  Superimposed on these changes is a 6 x 12 mm area of restricted diffusion in the RIGHT thalamus consistent with acute infarction. There is no associated hemorrhage, mass lesion, hydrocephalus, or extra-axial fluid.  Slight premature for age cerebral and cerebellar atrophy. Minor white matter disease, nonspecific. Normal midline structures. Flow voids are maintained.  Extracranial soft tissues unremarkable. Trace LEFT  mastoid effusion without nasopharyngeal mass. No sinus air-fluid level. Negative orbits.  IMPRESSION: Acute RIGHT thalamic infarction accounting for the LEFT-sided sensory symptoms.  Chronic lacunar infarcts are superimposed.   Electronically Signed   By: Staci Righter M.D.   On: 07/11/2015 10:57   US Carotid Bilateral  07/11/2015   CLINICAL DATA:  Stroke, syncope  EXAM: BILATERAL CAROTID DUPLEX ULTRASOUND  TECHNIQUE: Pearline Cables scale imaging, color Doppler and duplex ultrasound were performed of bilateral carotid and vertebral arteries in the neck.  COMPARISON:  None  FINDINGS: Criteria: Quantification of carotid stenosis is based on velocity parameters that correlate the residual internal carotid diameter with NASCET-based stenosis levels, using the diameter of the distal internal carotid lumen as the denominator for stenosis measurement.  The following velocity measurements were obtained:  RIGHT  ICA:  92/37 cm/sec  CCA:  40/97 cm/sec  SYSTOLIC ICA/CCA RATIO:  1.2  DIASTOLIC ICA/CCA RATIO:  2.1  ECA:  91 cm/sec  LEFT  ICA:  122/53 cm/sec  CCA:  35/32 cm/sec  SYSTOLIC ICA/CCA RATIO:  1.4  DIASTOLIC ICA/CCA RATIO:  2.3  ECA:  129 cm/sec  RIGHT CAROTID ARTERY: Mild tortuosity within the RIGHT carotid system. Intimal thickening with minimal amount of hypoechoic plaque at RIGHT carotid bulb. Laminar flow on color Doppler imaging. Minimal spectral broadening proximal RIGHT ICA. No high velocity jets.  RIGHT VERTEBRAL ARTERY:  Patent, antegrade  LEFT CAROTID ARTERY: Mildly tortuous LEFT carotid system. Minimal intimal thickening and non shadowing plaque at LEFT carotid bulb. Laminar flow by color upper imaging with minimal spectral broadening at proximal LEFT ICA on waveform analysis. No high velocity jets.  LEFT VERTEBRAL ARTERY:  Patent, antegrade  IMPRESSION: Minimal intimal thickening and plaque formation at the carotid bulbs bilaterally.  No evidence of hemodynamically significant stenosis.   Electronically Signed    By: Lavonia Dana M.D.   On: 07/11/2015 19:14       Management plans discussed with the patient, family and they are in agreement.  CODE STATUS:     Code Status Orders        Start     Ordered   07/11/15 1056  Full code   Continuous     07/11/15 1055      TOTAL TIME TAKING CARE OF THIS PATIENT: 35 minutes.    Vaughan Basta M.D on 07/12/2015 at 11:39 AM  Between 7am to 6pm - Pager - [phone]  After 6pm go to [email or web address] - password [email or web address]  [facility]  Office  [phone]  CC: Primary care physician; No primary care provider on file.

## 2015-07-12 NOTE — Evaluation (Signed)
Physical Therapy Evaluation Patient Details Name: Jacob Gonzales MRN: [id number] DOB: September 25, 1956 Today's Date: 07/12/2015   History of Present Illness  59 yo male with onset of gait instability with diagnosed chronic R thalamic and R lentiform infarcts, after losing balance and sustaining a bump on R side of head.  No acute changes.  Clinical Impression  Pt is moving fairly well but does need the additional assistance of RW and HHPT to follow him.  Will want to transition to outpatient therapy given especially his younger age and PLOF.  PT for home is agreed upon by PT and pt.    Follow Up Recommendations Home health PT;Supervision/Assistance - 24 hour    Equipment Recommendations  Rolling walker with 5" wheels;Other (comment) (Has one at home)    Recommendations for Other Services       Precautions / Restrictions Precautions Precautions: Fall;Other (comment) (telemetry with O2 sats) Restrictions Weight Bearing Restrictions: No      Mobility  Bed Mobility Overal bed mobility: Modified Independent                Transfers Overall transfer level: Modified independent               General transfer comment: uses safe technique  Ambulation/Gait Ambulation/Gait assistance: Supervision;Min guard (for safety) Ambulation Distance (Feet): 400 Feet Assistive device: Rolling walker (2 wheeled);1 person hand held assist Gait Pattern/deviations: Step-through pattern;Drifts right/left;Wide base of support Gait velocity: normal Gait velocity interpretation: at or above normal speed for age/gender General Gait Details: lists to R with LOB to L without AD but with RW can control these shifts  Stairs            Wheelchair Mobility    Modified Rankin (Stroke Patients Only)       Balance Overall balance assessment: Needs assistance Sitting-balance support: Feet supported Sitting balance-Leahy Scale: Good     Standing balance support: Bilateral upper extremity  supported Standing balance-Leahy Scale: Fair                               Pertinent Vitals/Pain Pain Assessment: No/denies pain    Home Living Family/patient expects to be discharged to:: Private residence Living Arrangements: Spouse/significant other Available Help at Discharge: Family;Available 24 hours/day Type of Home: House Home Access: Stairs to enter Entrance Stairs-Rails: Left Entrance Stairs-Number of Steps: 1 Home Layout: One level Home Equipment: Walker - 2 wheels;Cane - single point;Shower seat Additional Comments: Wife had equipment and pt feels he can use it    Prior Function Level of Independence: Independent               Hand Dominance   Dominant Hand: Right    Extremity/Trunk Assessment   Upper Extremity Assessment: Overall WFL for tasks assessed           Lower Extremity Assessment: LLE deficits/detail   LLE Deficits / Details: minor losses of balance with LLE unless using an AD  Cervical / Trunk Assessment: Normal  Communication   Communication: No difficulties  Cognition Arousal/Alertness: Awake/alert Behavior During Therapy: WFL for tasks assessed/performed Overall Cognitive Status: Within Functional Limits for tasks assessed                      General Comments General comments (skin integrity, edema, etc.): Pt has minor balance changes from his L side sensory losses and coordination changes.  He is appropriate for HHPT and use  of RW already at his home.  Pulses were 72 at rest to 92 with gait, sats initially unchanged from 97% to 98% after gait but after second walk was 91%.  Rapid recovery of both signals.    Exercises        Assessment/Plan    PT Assessment Patient needs continued PT services  PT Diagnosis Difficulty walking;Abnormality of gait   PT Problem List Decreased strength;Decreased range of motion;Decreased activity tolerance;Decreased balance;Decreased mobility;Decreased coordination;Decreased  safety awareness;Cardiopulmonary status limiting activity  PT Treatment Interventions DME instruction;Gait training;Stair training;Functional mobility training;Therapeutic activities;Therapeutic exercise;Balance training;Neuromuscular re-education;Patient/family education   PT Goals (Current goals can be found in the Care Plan section) Acute Rehab PT Goals Patient Stated Goal: to go home today PT Goal Formulation: With patient Time For Goal Achievement: 07/26/15 Potential to Achieve Goals: Good    Frequency Min 2X/week   Barriers to discharge  (none)      Co-evaluation               End of Session Equipment Utilized During Treatment: Gait belt Activity Tolerance: Patient tolerated treatment well Patient left: in bed;with call bell/phone within reach (sitting bedside) Nurse Communication: Mobility status         Time: 5583-1674 PT Time Calculation (min) (ACUTE ONLY): 20 min   Charges:   PT Evaluation $Initial PT Evaluation Tier I: 1 Procedure     PT G CodesRamond Dial Jul 13, 2015, 9:55 AM   Mee Hives, PT MS Acute Rehab Dept. Number: [facility] [phone] and [facility] [phone]

## 2015-07-12 NOTE — Plan of Care (Signed)
Problem: Discharge/Transitional Outcomes Goal: Other Discharge Outcomes/Goals Outcome: Progressing          Patient has Plans to return Home with Wife.  VSS.  Labs WDL. Tolerating Fluids Ambulating Independently Continues to have left side deficit with grip and sight.

## 2015-11-10 ENCOUNTER — Ambulatory Visit: Payer: Medicaid Other

## 2015-11-10 ENCOUNTER — Ambulatory Visit: Payer: Medicaid Other | Admitting: Occupational Therapy

## 2015-11-16 ENCOUNTER — Ambulatory Visit: Payer: Medicaid Other | Admitting: Occupational Therapy

## 2015-11-16 ENCOUNTER — Encounter: Payer: Self-pay | Admitting: Occupational Therapy

## 2015-11-16 ENCOUNTER — Ambulatory Visit: Payer: Medicaid Other | Attending: Nurse Practitioner | Admitting: Rehabilitative and Restorative Service Providers"

## 2015-11-16 DIAGNOSIS — I69398 Other sequelae of cerebral infarction: Secondary | ICD-10-CM | POA: Insufficient documentation

## 2015-11-16 DIAGNOSIS — M79642 Pain in left hand: Secondary | ICD-10-CM | POA: Diagnosis present

## 2015-11-16 DIAGNOSIS — R4189 Other symptoms and signs involving cognitive functions and awareness: Secondary | ICD-10-CM | POA: Insufficient documentation

## 2015-11-16 DIAGNOSIS — I6980 Unspecified sequelae of other cerebrovascular disease: Secondary | ICD-10-CM | POA: Diagnosis present

## 2015-11-16 DIAGNOSIS — M6289 Other specified disorders of muscle: Secondary | ICD-10-CM | POA: Insufficient documentation

## 2015-11-16 DIAGNOSIS — I69359 Hemiplegia and hemiparesis following cerebral infarction affecting unspecified side: Secondary | ICD-10-CM | POA: Diagnosis present

## 2015-11-16 DIAGNOSIS — R279 Unspecified lack of coordination: Secondary | ICD-10-CM | POA: Insufficient documentation

## 2015-11-16 DIAGNOSIS — M6281 Muscle weakness (generalized): Secondary | ICD-10-CM | POA: Insufficient documentation

## 2015-11-16 DIAGNOSIS — IMO0002 Reserved for concepts with insufficient information to code with codable children: Secondary | ICD-10-CM

## 2015-11-16 DIAGNOSIS — R269 Unspecified abnormalities of gait and mobility: Secondary | ICD-10-CM | POA: Diagnosis not present

## 2015-11-16 DIAGNOSIS — I69354 Hemiplegia and hemiparesis following cerebral infarction affecting left non-dominant side: Secondary | ICD-10-CM

## 2015-11-16 DIAGNOSIS — R29898 Other symptoms and signs involving the musculoskeletal system: Secondary | ICD-10-CM

## 2015-11-16 NOTE — Therapy (Signed)
[facility] [address] [place], [place], [address] Phone: [phone]   Fax:  [phone]  Physical Therapy Evaluation  Patient Details  Name: Jacob Gonzales MRN: [id number] Date of Birth: Sep 02, 1956 Referring Provider: Charlott Holler, FNP  Encounter Date: 11/16/2015      PT End of Session - 11/16/15 1439    Visit Number 1   Number of Visits 12   Date for PT Re-Evaluation 01/28/15  anticipate 1-2 weeks for m-caid auth to return   Authorization Type m-caid requesting 12 visits used over 8 weeks until 01/28/2015   PT Start Time 1022   PT Stop Time 1105   PT Time Calculation (min) 43 min   Activity Tolerance Patient tolerated treatment well   Behavior During Therapy [facility] for tasks assessed/performed      Past Medical History  Diagnosis Date  . Arthritis     Bilateral shoulders, Hands, Knees    No past surgical history on file.  There were no vitals filed for this visit.  Visit Diagnosis:  Abnormality of gait  Muscle weakness      Subjective Assessment - 11/16/15 1028    Subjective Patient is s/p CVA on 07/11/2015 reporting "sometimes I stumble a little".  He waited to get PT due to no insurance coverage (medicaid card now).     Pertinent History CVA, arthritis.  Patient farms.   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   Currently in Pain? Yes  see OT note for hand pain            [facility] PT Assessment - 11/16/15 1030    Assessment   Medical Diagnosis CVA   Referring Provider Charlott Holler, FNP   Onset Date/Surgical Date 07/11/15   Hand Dominance Right   Next MD Visit 11/17/2015   Prior Therapy none due to no insurance coverage per patient   Precautions   Precautions Fall   Precaution Comments patient reports imbalance   Restrictions   Weight Bearing Restrictions No   Balance Screen   Has the patient fallen in the past 6 months Yes   How many times? 6   catches himself on outstretched hands   Has the patient had a decrease in activity level because of a fear of falling?  Yes   Is the patient reluctant to leave their home because of a fear of falling?  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  tingling sensation described as painful with diminished LT   Light Touch Impaired Details Impaired LLE;Impaired LUE   ROM / Strength   AROM / PROM / Strength AROM;Strength   AROM   Overall AROM Comments grossly WFLs UEs/LEs   Strength   Overall Strength Comments R hip flexion 4+/5, L hip flexion 3+/5, R knee flexion/extension 5/5, L knee flexion/extension 4/5, R ankle dorsiflexion 5/5, L ankle dorsiflexion 3/5   Ambulation/Gait   Ambulation/Gait Yes   Ambulation/Gait Assistance 7: Independent  Ambulation Distance (Feet) 400 Feet   Assistive device None   Gait Pattern Antalgic  dec'd L weight shift with limp, reports abnormal L LE sens   Ambulation Surface Level   Gait velocity 2.38 ft/sec   Stairs Yes   Stairs Assistance 6: Modified independent (Device/Increase time)   Stair Management Technique One rail Right;Step to pattern   Number of Stairs --  4   Standardized Balance Assessment   Standardized Balance Assessment Berg Balance Test   Berg Balance Test   Sit to Stand Able to stand without using hands and stabilize independently   Standing Unsupported Able to stand safely 2 minutes   Sitting with Back Unsupported but Feet Supported on Floor or Stool Able to sit safely and securely 2 minutes   Stand to  Sit Sits safely with minimal use of hands   Transfers Able to transfer safely, minor use of hands   Standing Unsupported with Eyes Closed Able to stand 10 seconds safely   Standing Ubsupported with Feet Together Able to place feet together independently and stand 1 minute safely   From Standing, Reach Forward with Outstretched Arm Can reach confidently >25 cm (10")   From Standing Position, Pick up Object from Floor Able to pick up shoe safely and easily   From Standing Position, Turn to Look Behind Over each Shoulder Looks behind from both sides and weight shifts well   Turn 360 Degrees Able to turn 360 degrees safely but slowly   Standing Unsupported, Alternately Place Feet on Step/Stool Able to stand independently and safely and complete 8 steps in 20 seconds   Standing Unsupported, One Foot in Front Able to plae foot ahead of the other independently and hold 30 seconds   Standing on One Leg Able to lift leg independently and hold equal to or more than 3 seconds   Total Score 51   Berg comment: 51/56 with difficulty with single leg and tandem stance + slowed pace for turns.      HEP instructed-see patient education.      PT Education - 11/16/15 1438    Education provided Yes   Education Details HEP: sit<>stand, rolling a tennis ball under foot, ankle dorsiflexion   Person(s) Educated Patient   Methods Explanation;Demonstration;Handout   Comprehension Verbalized understanding;Returned demonstration          PT Short Term Goals - 11/16/15 1440    PT SHORT TERM GOAL #1   Title The patient will be independent with HEP for LE strength, high level balance.  Target date 12/24/2014   Baseline no HEP   Time 4   Period Weeks   PT SHORT TERM GOAL #2   Title The patient will improve gait speed from 2.38 ft/sec to > or equal to 2.8 ft/sec to demo transition to "full community ambulator".  Target date 12/24/2014   Baseline Gait speed=2.38 ft/sec   Time 4   Period Weeks   PT SHORT TERM  GOAL #3   Title The patient will move floor<>stand without support surface due to falls while outdoors on unlevel surfaces.  Target date 12/24/2014   Baseline Patient uses fence and other supports to pull up on-- falls are occurring outdoors.   Time 4   Period Weeks   PT SHORT TERM GOAL #4   Title The patient will improve L hip flexor strength to 4/5.  Target date 12/24/2014   Baseline 3+/5 hip flexion L side   Time 4   Period Weeks  PT Long Term Goals - 11/16/15 1445    PT LONG TERM GOAL #1   Title The patient will improve Berg balance score from 51/56 to > or equal to 53/56.  Target date 01/28/2015   Baseline 51/56    Time 8   Period Weeks   PT LONG TERM GOAL #2   Title The patient will increase L ankle strength to 4/5.  Target date 01/28/2015   Baseline 3/5 L ankle with shoe torn due to toe catching.   Time 8   Period Weeks   PT LONG TERM GOAL #3   Title The patient will negotiate 4 steps without handrails with reciprocal pattern.  Target date 01/28/2015   Baseline Stairs with handrails modified indep with step to pattern.   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  Target date 01/28/2015   Baseline Patient rests after 3-4 minutes.   Time 8   Period Weeks               Plan - 11/16/15 1447    Clinical Impression Statement The patient is a 59 year old male s/p CVA on 07/11/2015 with hospitalization.  He has not received OP PT services reporting that financial / insurance limited him prior to this time.  He presents with main impairments of sensory changes L UE/LE leading to antalgic L gait pattern, L LE weakness, decreased high level balance and decreased endurance.    Pt will benefit from skilled therapeutic intervention in order to improve on the following deficits Abnormal gait;Decreased activity tolerance;Decreased balance;Decreased  mobility;Decreased strength;Impaired sensation;Difficulty walking;Decreased endurance   Rehab Potential Good   Clinical Impairments Affecting Rehab Potential impaired sensation   PT Frequency --  12 visits used over 8 weeks (anticipate 2x/week x 4 weeks, then 1x/week x 4 weeks)   PT Treatment/Interventions Therapeutic exercise;Therapeutic activities;Functional mobility training;Stair training;Gait training;Patient/family education;Neuromuscular re-education;Balance training;ADLs/Self Care Home Management   PT Next Visit Plan Establish further HEP, L LE strengthening, work on compliant surfaces for 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   Consulted and Agree with Plan of Care Patient         Problem List Patient Active Problem List   Diagnosis Date Noted  . CVA (cerebral infarction) 07/11/2015   Thank you for the referral of this patient. Rudell Cobb, MPT   Matthews, PT 11/16/2015, 2:50 PM  [facility] [address] [place], [place], [address] Phone: [phone]   Fax:  [phone]  Name: Jacob Gonzales MRN: [id number] Date of Birth: 10/09/1956

## 2015-11-16 NOTE — Therapy (Signed)
[facility] [address] [place], [place], [address] Phone: [phone]   Fax:  [phone]  Occupational Therapy Evaluation  Patient Details  Name: Jacob Gonzales MRN: [id number] Date of Birth: Dec 29, 1955 Referring Provider: Charlott Holler  Encounter Date: 11/16/2015      OT End of Session - 11/16/15 1129    Visit Number 1   Number of Visits 13   Date for OT Re-Evaluation 01/15/16   Authorization Type MCD   Authorization - Visit Number 1   Authorization - Number of Visits 13   OT Start Time 0940   OT Stop Time 1015   OT Time Calculation (min) 35 min   Activity Tolerance Patient tolerated treatment well      Past Medical History  Diagnosis Date  . Arthritis     Bilateral shoulders, Hands, Knees    History reviewed. No pertinent past surgical history.  There were no vitals filed for this visit.  Visit Diagnosis:  Lack of coordination due to stroke - Plan: Ot plan of care cert/re-cert  Weakness of left hand - Plan: Ot plan of care cert/re-cert  Hemiparesis affecting left side as late effect of cerebrovascular accident (CVA) ([place]) - Plan: Ot plan of care cert/re-cert  Hemiparesis and alteration of sensations as late effects of stroke ([place]) - Plan: Ot plan of care cert/re-cert  Pain of left hand - Plan: Ot plan of care cert/re-cert  Impaired cognition - Plan: Ot plan of care cert/re-cert      Subjective Assessment - 11/16/15 0944    Subjective  I took myself off the meds, but still taking the iubrofen and aspirin   Patient Stated Goals to grip better with Lt hand   Currently in Pain? Yes   Pain Score 5   up to 9/10 at times   Pain Location Hand   Pain Orientation Left   Pain Descriptors / Indicators Burning   Pain Type Acute pain   Pain Onset More than a month ago   Pain Frequency Constant   Aggravating Factors  cold weather   Pain Relieving Factors positioning           [facility] OT Assessment -  11/16/15 0001    Assessment   Diagnosis Rt CVA  Lt hemiparesis   Referring Provider Charlott Holler   Onset Date 07/11/15   Assessment Lt hemiparesis   Prior Therapy none   Precautions   Precautions Fall   Balance Screen   Has the patient fallen in the past 6 months Yes   How many times? 5   Has the patient had a decrease in activity level because of a fear of falling?  No   Is the patient reluctant to leave their home because of a fear of falling?  No   Home  Environment   Bathroom Shower/Tub Tub/Shower [employer] - single point;Walker - 2 wheels;Bedside commode   Additional Comments lives in 1 story home with 3-4 steps to enter   Lives With Spouse   Prior Function   Level of [place] Full time employment   Vocation Requirements [name] - has [place] acres   ADL   ADL comments independent with eating and grooming. Min assist for buttons on shirt and pants and assist tying Lt shoe. Mod I for bathing and toileting (except button on pants). Pt has returned to some cleaning (wife always did cooking). 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 Pt could detect at dorsal wrist and more proximal LUE   Coordination   9 Hole Peg Test Right;Left   Right 9 Hole Peg Test 33.97 sec   Left 9 Hole Peg Test 44.69 sec   Box and Blocks Rt = 59, Lt = 38    Edema   Edema None at eval, but does report occasionally in Lt  hand   ROM / Strength   AROM / PROM / Strength AROM;Strength   AROM   Overall AROM Comments BUE AROM WNL's except 90% full composite flexion Lt hand   Strength   Overall Strength Comments RUE MMT grossly 5/5, LUE MMT grossly 4/5   Hand Function   Right Hand Grip (lbs) 78 lbs   Left Hand Grip (lbs) 40 lbs                           OT Short Term Goals - 11/16/15 1133    OT SHORT TERM GOAL #1   Title Independent with HEP for LUE (all STG's due by 12/16/15)   Baseline not issued yet   Time 4   Period Weeks   Status New   OT SHORT TERM GOAL #2   Title Pt to verbalize understanding with safety considerations/techniques due to lack of sensation and pain management strategies LUE   Baseline Dependent   Time 4   Period Weeks   Status New   OT SHORT TERM GOAL #3   Title Pt to hook buttons and tie shoes consistently at independent level with A/E prn   Baseline requires assist    Time 4   Period Weeks   Status New           OT Long Term Goals - 11/16/15 1136    OT LONG TERM GOAL #1   Title Independent with updated HEP (all LTG's due by 01/15/16)   Baseline Dependent    Time 8   Period Weeks   Status New   OT LONG TERM GOAL #2   Title Improve coordination Lt hand as evidenced by performing 9 hole peg test to 38 sec. 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 male who presents to outpatient rehab s/p CVA 07/11/15 with Lt hemiparesis. Pt has had no therapy since d/c from hospital on 07/12/15 due to awaiting insurance coverage. Pt also reports he has stopped taking his BP and cholesterol medicine which therapist explained risks and encouraged to discuss with Dr. Jaynee Eagles tomorrow   Pt will benefit from skilled therapeutic intervention in order to improve on the following deficits (Retired) Decreased coordination;Decreased endurance;Decreased safety awareness;Impaired sensation;Increased edema;Decreased knowledge of precautions;Decreased activity tolerance;Decreased balance;Decreased knowledge of use of DME;Impaired UE functional use;Pain;Decreased mobility;Decreased strength;Decreased cognition;Impaired perceived functional ability   OT Frequency --  Requesting 12 visits over 8 week duration from MCD   OT Treatment/Interventions Self-care/ADL training;Therapeutic exercise;Patient/family education;Functional Mobility Training;Neuromuscular education;Manual Therapy;Splinting;Energy conservation;Parrafin;DME and/or AE instruction;Therapeutic activities;Electrical Stimulation;Fluidtherapy;Cognitive remediation/compensation;Moist Heat;Passive range of motion   Plan Coordination and putty HEP, Check for MCD approval   Consulted and Agree with Plan of Care Patient;Family member/caregiver   Family Member Consulted WIFE        Problem List Patient Active Problem List   Diagnosis Date Noted  . CVA (cerebral infarction) 07/11/2015    Carey Bullocks, OTR/L 11/16/2015, 11:46 AM  [facility] [address] [place], [place], [address] Phone: [phone]   Fax:  [phone]  Name: Jacob Gonzales MRN: [id number] Date of Birth: [birth year]

## 2015-11-16 NOTE — Patient Instructions (Signed)
Functional Quadriceps: Sit to Stand    Sit on edge of chair, feet flat on floor. Stand upright, extending knees fully.  NO HANDS WITH LEFT FOOT POSITIONED BEHIND THE RIGHT. Repeat _10___ times per set. Do __2__ sets per session. Do __1__ sessions per day.  http://orth.exer.us/734   Copyright  VHI. All rights reserved.  Foot Arch Stretch (Plantar Fascia)    Sitting on edge of chair, place foot on top of rolling pin OR A TENNIS BALL  and gently roll foot forward and backward over rolling pin. Feel stretch in arch of foot. Repeat  For 2 minutes. Do _1___ sessions per day.  http://gt2.exer.us/415   Copyright  VHI. All rights reserved.  Ankle Bend    Sitting straight, move heels back as far as possible. Attempt to bend ankles by lifting toes off floor.  (can begin with left foot positioned slightly forward).  Feel stretch on back of calf. Repeat __10__ times. Do __1__ sessions per day.  http://gt2.exer.us/469   Copyright  VHI. All rights reserved.

## 2015-11-17 ENCOUNTER — Ambulatory Visit (INDEPENDENT_AMBULATORY_CARE_PROVIDER_SITE_OTHER): Payer: Medicaid Other | Admitting: Neurology

## 2015-11-17 ENCOUNTER — Encounter: Payer: Self-pay | Admitting: Neurology

## 2015-11-17 VITALS — BP 142/76 | HR 82 | Temp 98.6°F | Ht 71.0 in | Wt 284.4 lb

## 2015-11-17 DIAGNOSIS — I63211 Cerebral infarction due to unspecified occlusion or stenosis of right vertebral arteries: Secondary | ICD-10-CM

## 2015-11-17 DIAGNOSIS — I63219 Cerebral infarction due to unspecified occlusion or stenosis of unspecified vertebral arteries: Secondary | ICD-10-CM

## 2015-11-17 DIAGNOSIS — I6322 Cerebral infarction due to unspecified occlusion or stenosis of basilar arteries: Secondary | ICD-10-CM | POA: Diagnosis not present

## 2015-11-17 DIAGNOSIS — I10 Essential (primary) hypertension: Secondary | ICD-10-CM

## 2015-11-17 DIAGNOSIS — E785 Hyperlipidemia, unspecified: Secondary | ICD-10-CM

## 2015-11-17 DIAGNOSIS — Z9114 Patient's other noncompliance with medication regimen: Secondary | ICD-10-CM | POA: Insufficient documentation

## 2015-11-17 DIAGNOSIS — E119 Type 2 diabetes mellitus without complications: Secondary | ICD-10-CM

## 2015-11-17 DIAGNOSIS — I6381 Other cerebral infarction due to occlusion or stenosis of small artery: Secondary | ICD-10-CM

## 2015-11-17 HISTORY — DX: Cerebral infarction due to unspecified occlusion or stenosis of basilar artery: I63.219

## 2015-11-17 HISTORY — DX: Essential (primary) hypertension: I10

## 2015-11-17 HISTORY — DX: Other cerebral infarction due to occlusion or stenosis of small artery: I63.81

## 2015-11-17 HISTORY — DX: Type 2 diabetes mellitus without complications: E11.9

## 2015-11-17 NOTE — Patient Instructions (Signed)
Overall you are doing fairly well but I do want to suggest a few things today:   Remember to drink plenty of fluid, eat healthy meals and do not skip any meals. Try to eat protein with a every meal and eat a healthy snack such as fruit or nuts in between meals. Try to keep a regular sleep-wake schedule and try to exercise daily, particularly in the form of walking, 20-30 minutes a day, if you can.   As far as your medications are concerned, I would like to suggest:  Take Atorvastatin and see if this is the medication causing you symptoms. Let Charlott Holler know. You need to be on cholesterol medication, blood pressure medication and medication for your diabetes. Weight loss as well. Daily baby aspirin.   As far as diagnostic testing: none  I would like to see you back as needed, sooner if we need to. Please call us with any interim questions, concerns, problems, updates or refill requests.    Our phone number is [phone]. We also have an after hours call service for urgent matters and there is a physician on-call for urgent questions. For any emergencies you know to call 911 or go to the nearest emergency room

## 2015-11-17 NOTE — Progress Notes (Signed)
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 male here as a referral from Dr. Chauncy Passy for thalamic stroke. A month before the strokehe had numbness for 4 hours on the left side, thought it was just the way he was sleeping and didn't do anything about it. A month later it happened again and went to the emergency room and was admitted. The symptoms have not gotten better. No pain. He describes left-sided weakness in the grip and in his leg and hemisensory loss. He started physical therapy. He was not on aspirin at the time of the stroke. He has multiple risk factors otherwise no inciting events to the stroke. 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 Since discharge has been on a baby aspirin. Never smoked. He was started on lipitor and lisinopril. He stopped the lisinopril and the lipitor. He said one of them made him tired. No sore muscles. He stopped both and he felt better. His HgbA1c 7.5, he was not aware that he was diabetec and he is not being treated. No other side effects.  Reviewed notes, labs and imaging from outside physicians, which showed:  HgbA1c 7.5 LDL 158  MRI of the brain:  Personally reviewed images with patient: Acute RIGHT thalamic infarction accounting for the LEFT-sided sensory symptoms.  Review of Systems: Patient complains of symptoms per HPI as well as the following symptoms: feeling cold, joint pain, headache, numbness, weakness, dizziness. Pertinent negatives per HPI. All others negative.   Social History   Social History  . Marital Status: Married    Spouse Name: N/A  . Number of Children: 3  . Years of Education: N/A   Occupational History  . Not on file.   Social History Main Topics  . Smoking status: Never Smoker   . Smokeless tobacco: Not on  file  . Alcohol Use: No  . Drug Use: No  . Sexual Activity: Not on file   Other Topics Concern  . Not on file   Social History Narrative   Lives at home with wife   Caffeine use: Drinks soda (2-12packs per week)    Family History  Problem Relation Age of Onset  . Heart attack Mother   . Heart attack Father   . Diabetes Mellitus II Father   . Kidney failure Father   . Uterine cancer Sister   . Heart attack Brother   . Heart attack Maternal Aunt   . Heart attack Maternal Uncle   . Heart attack Paternal Aunt   . Heart attack Paternal Uncle   . Stroke Neg Hx     Past Medical History  Diagnosis Date  . Arthritis     Bilateral shoulders, Hands, Knees  . Acute arterial ischemic stroke, vertebrobasilar, thalamic ([place]) 11/17/2015  . Diabetes ([place]) 11/17/2015  . HTN (hypertension) 11/17/2015    Past Surgical History  Procedure Laterality Date  . No past surgeries      Current Outpatient Prescriptions  Medication Sig Dispense Refill  . aspirin 81 MG chewable tablet Chew 1 tablet (81 mg total) by mouth daily. 30 tablet 3  . atorvastatin (LIPITOR) 40 MG tablet Take 1 tablet (40 mg total) by mouth daily at 6 PM. 30 tablet 3  . ibuprofen (ADVIL,MOTRIN) 200 MG  tablet Take 1,000 mg by mouth daily.    Marland Kitchen lisinopril (PRINIVIL,ZESTRIL) 10 MG tablet Take 1 tablet (10 mg total) by mouth daily. 30 tablet 3   No current facility-administered medications for this visit.    Allergies as of 11/17/2015  . (No Known Allergies)    Vitals: BP 142/76 mmHg  Pulse 82  Temp(Src) 98.6 F (37 C) (Oral)  Ht '5\' 11"'$  (1.803 m)  Wt 284 lb 6.4 oz (129.003 kg)  BMI 39.68 kg/m2 Last Weight:  Wt Readings from Last 1 Encounters:  11/17/15 284 lb 6.4 oz (129.003 kg)   Last Height:   Ht Readings from Last 1 Encounters:  11/17/15 '5\' 11"'$  (1.803 m)     Physical exam: Exam: Gen: NAD, conversant, well nourised, obese, well groomed                     CV: RRR, no MRG. No Carotid Bruits. No  peripheral edema, warm, nontender Eyes: Conjunctivae clear without exudates or hemorrhage  Neuro: Detailed Neurologic Exam  Speech:    Speech is normal; fluent and spontaneous with normal comprehension.  Cognition:    The patient is oriented to person, place, and time;     recent and remote memory intact;     language fluent;     normal attention, concentration,     fund of knowledge Cranial Nerves:    The pupils are equal, round, and reactive to light. The fundi are flat Visual fields are full to finger confrontation. Extraocular movements are intact. Trigeminal sensation is intact and the muscles of mastication are normal. The face is symmetric. The palate elevates in the midline. Hearing intact. Voice is normal. Shoulder shrug is normal. The tongue has normal motion without fasciculations.   Coordination: fine motor difficulty on the left hand.     Gait:    Heel-toe  are normal.   Motor Observation:     No asymmetry, no atrophy, and no involuntary movements noted. Tone:    Normal muscle tone.    Posture:    Posture is normal. normal erect    Strength: mild weakness in the deltoid and triceps on the left. Otherwise strength is V/V in the upper and lower limbs.      Sensation: left hemisensory loss     Reflex Exam:  DTR's:    Deep tendon reflexes in the upper and lower extremities are normal bilaterally.   Toes:    The toes are downgoing bilaterally.   Clonus:    Clonus is absent.     Assessment/Plan:  59 year old patient with multiple untreated risk factors (obesity, HTN, HLD, Diabetes) and  left thalamic stroke due to small-vessel disease  Needs to follow up with Charlott Holler for close management of vascular risk factors. Highly encouraged, discussed that aggressive treatment is needed to prevent another stroke. Encouraged him to restart the atorvastatin and lisinopril separately  to see which one caused side effects. He needs to be on a statin and blood pressure  medication. Needs to be treated for HgbA1c 7.5, in the diabetic range Diet and exercise for obesity C/w ASA '81mg'$  for stroke prevention  I had a long discussion with patient about his recent stroke, risk for recurrent stroke/TIAs, personally independently reviewed imaging studies and stroke evaluation results and answered questions.Continue ASA for secondary stroke prevention and maintain strict control of hypertension with blood pressure goal below 130/90, diabetes with hemoglobin A1c goal below 6.5% and lipids with LDL cholesterol goal  below 70 mg/dL. I also advised the patient to eat a healthy diet with plenty of whole grains, cereals, fruits and vegetables, exercise regularly and maintain ideal body weight .Followup with Charlott Holler  Cc: lynn lam  Sarina Ill, MD  [facility] [address] [place], [place] [address]  Phone [phone] Fax [phone]

## 2015-11-18 ENCOUNTER — Encounter: Payer: Self-pay | Admitting: *Deleted

## 2015-11-18 NOTE — Progress Notes (Signed)
Faxed Dr Jaynee Eagles recent [place] note to Charlott Holler, NP per Dr Jaynee Eagles request. Received confirmation. Fax: [phone]

## 2015-11-24 ENCOUNTER — Ambulatory Visit: Payer: Medicaid Other | Admitting: Rehabilitative and Restorative Service Providers"

## 2015-11-24 ENCOUNTER — Encounter: Payer: Medicaid Other | Admitting: Occupational Therapy

## 2015-11-26 ENCOUNTER — Ambulatory Visit: Payer: Medicaid Other | Admitting: Physical Therapy

## 2015-11-26 ENCOUNTER — Ambulatory Visit: Payer: Medicaid Other | Admitting: *Deleted

## 2015-12-01 ENCOUNTER — Ambulatory Visit: Payer: Medicaid Other | Admitting: *Deleted

## 2015-12-01 ENCOUNTER — Ambulatory Visit: Payer: Medicaid Other | Admitting: Physical Therapy

## 2015-12-03 ENCOUNTER — Ambulatory Visit: Payer: Medicaid Other | Admitting: Rehabilitative and Restorative Service Providers"

## 2015-12-08 ENCOUNTER — Ambulatory Visit: Payer: Medicaid Other | Admitting: Physical Therapy

## 2015-12-08 ENCOUNTER — Ambulatory Visit: Payer: Medicaid Other | Attending: Nurse Practitioner | Admitting: Occupational Therapy

## 2015-12-10 ENCOUNTER — Ambulatory Visit: Payer: Medicaid Other | Admitting: Occupational Therapy

## 2015-12-10 ENCOUNTER — Telehealth: Payer: Self-pay | Admitting: Occupational Therapy

## 2015-12-10 ENCOUNTER — Ambulatory Visit: Payer: Medicaid Other | Admitting: Rehabilitative and Restorative Service Providers"

## 2015-12-10 NOTE — Telephone Encounter (Signed)
Called patient regarding missed therapy appointments and pt was getting confused between therapy and MD visits. Reminded patient of next scheduled appointment with outpatient therapy on 12/14/15. Pt agreed to come. Pt was also informed that insurance visits were approved and covered.

## 2015-12-14 ENCOUNTER — Ambulatory Visit: Payer: Medicaid Other | Admitting: Physical Therapy

## 2015-12-14 ENCOUNTER — Ambulatory Visit: Payer: Medicaid Other | Admitting: Occupational Therapy

## 2015-12-17 ENCOUNTER — Ambulatory Visit: Payer: Medicaid Other | Admitting: Rehabilitative and Restorative Service Providers"

## 2015-12-17 ENCOUNTER — Ambulatory Visit: Payer: Medicaid Other | Admitting: Occupational Therapy

## 2015-12-22 ENCOUNTER — Ambulatory Visit: Payer: Medicaid Other | Admitting: Rehabilitative and Restorative Service Providers"

## 2015-12-28 ENCOUNTER — Encounter: Payer: Self-pay | Admitting: Occupational Therapy

## 2015-12-28 NOTE — Therapy (Signed)
[facility] [address] [place], [place], [address] Phone: [phone]   Fax:  [phone]  Patient Details  Name: Jacob Gonzales MRN: [id number] Date of Birth: [birth year] Referring Provider:  No ref. provider found  Encounter Date: 12/28/2015  Pt did not return after initial O.T. Evaluation on 11/15/16 and therefore will d/c episode of care at this time. (Pt was called x 2 re: missed appointments and informed of next scheduled appointment and insurance coverage)  Carey Bullocks, OTR/L 12/28/2015, 12:54 PM  [facility] [address] [place], [place], [address] Phone: [phone]   Fax:  [phone]

## 2015-12-29 ENCOUNTER — Ambulatory Visit: Payer: Medicaid Other | Admitting: Physical Therapy

## 2016-01-05 ENCOUNTER — Ambulatory Visit: Payer: Medicaid Other | Admitting: Rehabilitative and Restorative Service Providers"

## 2016-01-12 ENCOUNTER — Ambulatory Visit: Payer: Medicaid Other | Admitting: Physical Therapy

## 2016-01-21 ENCOUNTER — Encounter: Payer: Self-pay | Admitting: Rehabilitative and Restorative Service Providers"

## 2016-01-21 NOTE — Therapy (Signed)
[facility] [address] [place], [place], [address] Phone: [phone]   Fax:  [phone]  Patient Details  Name: Jacob Gonzales MRN: [id number] Date of Birth: February 19, 1956 Referring Provider:  No ref. provider found  Encounter Date: last encounter 11/16/2015  Patient did not return for PT after initial evaluation.  See eval note from 11/16/2015 for patient status. Thank you for the referral of this patient. Rudell Cobb, MPT    Romone Shaff 01/21/2016, 8:33 AM  [facility] [address] [place], [place], [address] Phone: [phone]   Fax:  [phone]

## 2016-02-01 DIAGNOSIS — Z0279 Encounter for issue of other medical certificate: Secondary | ICD-10-CM

## 2019-03-03 ENCOUNTER — Emergency Department (HOSPITAL_COMMUNITY): Payer: Medicaid Other

## 2019-03-03 ENCOUNTER — Observation Stay (HOSPITAL_COMMUNITY)
Admission: EM | Admit: 2019-03-03 | Discharge: 2019-03-04 | Disposition: A | Discharge status: Home / Self Care | Payer: Medicaid Other | Attending: Family Medicine | Admitting: Family Medicine

## 2019-03-03 ENCOUNTER — Other Ambulatory Visit: Payer: Self-pay

## 2019-03-03 ENCOUNTER — Encounter (HOSPITAL_COMMUNITY): Payer: Self-pay

## 2019-03-03 DIAGNOSIS — R0789 Other chest pain: Secondary | ICD-10-CM | POA: Insufficient documentation

## 2019-03-03 DIAGNOSIS — I110 Hypertensive heart disease with heart failure: Secondary | ICD-10-CM | POA: Diagnosis not present

## 2019-03-03 DIAGNOSIS — R55 Syncope and collapse: Principal | ICD-10-CM | POA: Diagnosis present

## 2019-03-03 DIAGNOSIS — I5021 Acute systolic (congestive) heart failure: Secondary | ICD-10-CM | POA: Diagnosis not present

## 2019-03-03 DIAGNOSIS — E785 Hyperlipidemia, unspecified: Secondary | ICD-10-CM | POA: Insufficient documentation

## 2019-03-03 DIAGNOSIS — Z8673 Personal history of transient ischemic attack (TIA), and cerebral infarction without residual deficits: Secondary | ICD-10-CM | POA: Insufficient documentation

## 2019-03-03 DIAGNOSIS — Z8249 Family history of ischemic heart disease and other diseases of the circulatory system: Secondary | ICD-10-CM | POA: Insufficient documentation

## 2019-03-03 DIAGNOSIS — Z7982 Long term (current) use of aspirin: Secondary | ICD-10-CM | POA: Diagnosis not present

## 2019-03-03 DIAGNOSIS — I313 Pericardial effusion (noninflammatory): Secondary | ICD-10-CM | POA: Diagnosis not present

## 2019-03-03 DIAGNOSIS — I700 Atherosclerosis of aorta: Secondary | ICD-10-CM | POA: Insufficient documentation

## 2019-03-03 DIAGNOSIS — I452 Bifascicular block: Secondary | ICD-10-CM | POA: Insufficient documentation

## 2019-03-03 DIAGNOSIS — Z9119 Patient's noncompliance with other medical treatment and regimen: Secondary | ICD-10-CM | POA: Diagnosis not present

## 2019-03-03 DIAGNOSIS — M1990 Unspecified osteoarthritis, unspecified site: Secondary | ICD-10-CM | POA: Diagnosis not present

## 2019-03-03 DIAGNOSIS — I10 Essential (primary) hypertension: Secondary | ICD-10-CM | POA: Diagnosis not present

## 2019-03-03 DIAGNOSIS — E119 Type 2 diabetes mellitus without complications: Secondary | ICD-10-CM | POA: Diagnosis not present

## 2019-03-03 DIAGNOSIS — Z79899 Other long term (current) drug therapy: Secondary | ICD-10-CM | POA: Insufficient documentation

## 2019-03-03 LAB — CBC WITH DIFFERENTIAL/PLATELET
Abs Immature Granulocytes: 0.08 10*3/uL — ABNORMAL HIGH (ref 0.00–0.07)
BASOS PCT: 1 %
Basophils Absolute: 0.1 10*3/uL (ref 0.0–0.1)
EOS ABS: 0.3 10*3/uL (ref 0.0–0.5)
EOS PCT: 3 %
HCT: 44 % (ref 39.0–52.0)
Hemoglobin: 14.7 g/dL (ref 13.0–17.0)
Immature Granulocytes: 1 %
LYMPHS ABS: 4 10*3/uL (ref 0.7–4.0)
Lymphocytes Relative: 33 %
MCH: 29.4 pg (ref 26.0–34.0)
MCHC: 33.4 g/dL (ref 30.0–36.0)
MCV: 88 fL (ref 80.0–100.0)
MONO ABS: 1.1 10*3/uL — AB (ref 0.1–1.0)
Monocytes Relative: 9 %
Neutro Abs: 6.3 10*3/uL (ref 1.7–7.7)
Neutrophils Relative %: 53 %
PLATELETS: 293 10*3/uL (ref 150–400)
RBC: 5 MIL/uL (ref 4.22–5.81)
RDW: 13.4 % (ref 11.5–15.5)
WBC: 11.8 10*3/uL — ABNORMAL HIGH (ref 4.0–10.5)
nRBC: 0 % (ref 0.0–0.2)

## 2019-03-03 LAB — COMPREHENSIVE METABOLIC PANEL
ALT: 17 U/L (ref 0–44)
AST: 19 U/L (ref 15–41)
Albumin: 3.9 g/dL (ref 3.5–5.0)
Alkaline Phosphatase: 72 U/L (ref 38–126)
Anion gap: 13 (ref 5–15)
BILIRUBIN TOTAL: 0.8 mg/dL (ref 0.3–1.2)
BUN: 12 mg/dL (ref 6–20)
CO2: 20 mmol/L — ABNORMAL LOW (ref 22–32)
Calcium: 8.7 mg/dL — ABNORMAL LOW (ref 8.9–10.3)
Chloride: 102 mmol/L (ref 98–111)
Creatinine, Ser: 0.85 mg/dL (ref 0.61–1.24)
Glucose, Bld: 95 mg/dL (ref 70–99)
POTASSIUM: 3.7 mmol/L (ref 3.5–5.1)
Sodium: 135 mmol/L (ref 135–145)
TOTAL PROTEIN: 7.8 g/dL (ref 6.5–8.1)

## 2019-03-03 LAB — CBG MONITORING, ED: Glucose-Capillary: 97 mg/dL (ref 70–99)

## 2019-03-03 LAB — HEMOGLOBIN A1C
Hgb A1c MFr Bld: 6.8 % — ABNORMAL HIGH (ref 4.8–5.6)
Mean Plasma Glucose: 148.46 mg/dL

## 2019-03-03 LAB — TROPONIN I
Troponin I: <0.03 ng/mL (ref <0.03)
Troponin I: <0.03 ng/mL (ref <0.03)

## 2019-03-03 LAB — BRAIN NATRIURETIC PEPTIDE: B Natriuretic Peptide: 20.5 pg/mL (ref 0.0–100.0)

## 2019-03-03 MED ORDER — ASPIRIN 81 MG PO CHEW
81.0000 mg | CHEWABLE_TABLET | Freq: Every day | ORAL | Status: DC
  Administered 2019-03-04: 81 mg via ORAL
  Filled 2019-03-03: qty 1

## 2019-03-03 MED ORDER — ACETAMINOPHEN 650 MG RE SUPP: 650.0000 mg | Freq: Four times a day (QID) | RECTAL | Status: DC | PRN

## 2019-03-03 MED ORDER — AMLODIPINE BESYLATE 10 MG PO TABS
10.0000 mg | ORAL_TABLET | Freq: Every day | ORAL | Status: DC
  Administered 2019-03-04: 10 mg via ORAL
  Filled 2019-03-03: qty 1

## 2019-03-03 MED ORDER — ALUM & MAG HYDROXIDE-SIMETH 200-200-20 MG/5ML PO SUSP: 30.0000 mL | Freq: Four times a day (QID) | ORAL | Status: DC | PRN

## 2019-03-03 MED ORDER — LISINOPRIL 10 MG PO TABS
10.0000 mg | ORAL_TABLET | Freq: Every day | ORAL | Status: DC
  Administered 2019-03-04: 10 mg via ORAL
  Filled 2019-03-03: qty 1

## 2019-03-03 MED ORDER — ACETAMINOPHEN 325 MG PO TABS: 650.0000 mg | ORAL_TABLET | Freq: Four times a day (QID) | ORAL | Status: DC | PRN

## 2019-03-03 MED ORDER — POLYETHYLENE GLYCOL 3350 17 G PO PACK: 17.0000 g | PACK | Freq: Every day | ORAL | Status: DC | PRN

## 2019-03-03 MED ORDER — ONDANSETRON HCL 4 MG/2ML IJ SOLN: 4.0000 mg | Freq: Four times a day (QID) | INTRAMUSCULAR | Status: DC | PRN

## 2019-03-03 MED ORDER — ONDANSETRON HCL 4 MG PO TABS: 4.0000 mg | ORAL_TABLET | Freq: Four times a day (QID) | ORAL | Status: DC | PRN

## 2019-03-03 MED ORDER — ENOXAPARIN SODIUM 40 MG/0.4ML ~~LOC~~ SOLN
40.0000 mg | Freq: Every day | SUBCUTANEOUS | Status: DC
  Administered 2019-03-04: 40 mg via SUBCUTANEOUS
  Filled 2019-03-03: qty 0.4

## 2019-03-03 NOTE — ED Notes (Signed)
ED TO INPATIENT HANDOFF REPORT  ED Nurse Name and Phone #: Ginny Forth [phone]  S Name/Age/Gender Jacob Gonzales 63 y.o. male Room/Bed: 045C/045C  Code Status   Code Status: Prior  Home/SNF/Other Home Patient oriented to: self, place, time and situation Is this baseline? Yes   Triage Complete: Triage complete  Chief Complaint SYNCOPE  Triage Note Pt was in care today, stood up and had syncopal episode. Denies hitting head. During episode pt had chest pain. Chest pain has resolved.   Allergies No Known Allergies  Level of Care/Admitting Diagnosis ED Disposition    ED Disposition Condition [facility] Area: [place] [[id number]]  Level of Care: Telemetry Medical [104]  Diagnosis: Syncope [510258]  Admitting Physician: YOSKAR, MURRILLO [[id number]]  Attending Physician: Owens Shark, CARINA Jerilynn Mages [[id number]]  PT Class (Do Not Modify): Observation [104]  PT Acc Code (Do Not Modify): Observation [10022]       B Medical/Surgery History Past Medical History:  Diagnosis Date  . Acute arterial ischemic stroke, vertebrobasilar, thalamic ([place]) 11/17/2015  . Arthritis    Bilateral shoulders, Hands, Knees  . Diabetes ([place]) 11/17/2015  . HTN (hypertension) 11/17/2015   Past Surgical History:  Procedure Laterality Date  . NO PAST SURGERIES       A IV Location/Drains/Wounds Patient Lines/Drains/Airways Status   Active Line/Drains/Airways    None          Intake/Output Last 24 hours No intake or output data in the 24 hours ending 03/03/19 2208  Labs/Imaging Results for orders placed or performed during the hospital encounter of 03/03/19 (from the past 48 hour(s))  CBG monitoring, ED     Status: None   Collection Time: 03/03/19  7:06 PM  Result Value Ref Range   Glucose-Capillary 97 70 - 99 mg/dL  CBC with Differential/Platelet     Status: Abnormal   Collection Time: 03/03/19  7:29 PM  Result Value Ref Range   WBC 11.8 (H) 4.0 - 10.5 K/uL   RBC 5.00  4.22 - 5.81 MIL/uL   Hemoglobin 14.7 13.0 - 17.0 g/dL   HCT 44.0 39.0 - 52.0 %   MCV 88.0 80.0 - 100.0 fL   MCH 29.4 26.0 - 34.0 pg   MCHC 33.4 30.0 - 36.0 g/dL   RDW 13.4 11.5 - 15.5 %   Platelets 293 150 - 400 K/uL   nRBC 0.0 0.0 - 0.2 %   Neutrophils Relative % 53 %   Neutro Abs 6.3 1.7 - 7.7 K/uL   Lymphocytes Relative 33 %   Lymphs Abs 4.0 0.7 - 4.0 K/uL   Monocytes Relative 9 %   Monocytes Absolute 1.1 (H) 0.1 - 1.0 K/uL   Eosinophils Relative 3 %   Eosinophils Absolute 0.3 0.0 - 0.5 K/uL   Basophils Relative 1 %   Basophils Absolute 0.1 0.0 - 0.1 K/uL   Immature Granulocytes 1 %   Abs Immature Granulocytes 0.08 (H) 0.00 - 0.07 K/uL    Comment: Performed at [facility], [address], [place], [place] [address]  Comprehensive metabolic panel     Status: Abnormal   Collection Time: 03/03/19  7:29 PM  Result Value Ref Range   Sodium 135 135 - 145 mmol/L   Potassium 3.7 3.5 - 5.1 mmol/L   Chloride 102 98 - 111 mmol/L   CO2 20 (L) 22 - 32 mmol/L   Glucose, Bld 95 70 - 99 mg/dL   BUN 12 6 - 20 mg/dL  Creatinine, Ser 0.85 0.61 - 1.24 mg/dL   Calcium 8.7 (L) 8.9 - 10.3 mg/dL   Total Protein 7.8 6.5 - 8.1 g/dL   Albumin 3.9 3.5 - 5.0 g/dL   AST 19 15 - 41 U/L   ALT 17 0 - 44 U/L   Alkaline Phosphatase 72 38 - 126 U/L   Total Bilirubin 0.8 0.3 - 1.2 mg/dL   GFR calc non Af Amer >60 >60 mL/min   GFR calc Af Amer >60 >60 mL/min   Anion gap 13 5 - 15    Comment: Performed at [facility] [address] [place], [place] [address]  Troponin I - ONCE - STAT     Status: None   Collection Time: 03/03/19  7:29 PM  Result Value Ref Range   Troponin I <0.03 <0.03 ng/mL    Comment: Performed at [facility], [place] [address], [place], [place] [address]   Dg Chest 2 View  Result Date: 03/03/2019 CLINICAL DATA:  Syncopal episode with standing. EXAM: CHEST - 2 VIEW COMPARISON:  None. FINDINGS: Minimal atelectasis in the left base. The heart, hila, mediastinum,  lungs, and pleura are otherwise unremarkable. IMPRESSION: No active cardiopulmonary disease. Electronically Signed   By: Dorise Bullion III M.D   On: 03/03/2019 19:50   Ct Head Wo Contrast  Result Date: 03/03/2019 CLINICAL DATA:  Altered level of consciousness. EXAM: CT HEAD WITHOUT CONTRAST TECHNIQUE: Contiguous axial images were obtained from the base of the skull through the vertex without intravenous contrast. COMPARISON:  07/11/2015 FINDINGS: Brain: No evidence of acute infarction, hemorrhage, hydrocephalus, extra-axial collection or mass lesion/mass effect. Chronic right basal ganglia lacunar infarcts are identified with ex vacuo dilatation of the anterior horn of the right lateral ventricle. Old left cerebellar infarct also noted. Vascular: No hyperdense vessel or unexpected calcification. Skull: Normal. Negative for fracture or focal lesion. Sinuses/Orbits: No acute finding. Other: None. IMPRESSION: 1. No acute intracranial abnormality. 2. Chronic right basal ganglia lacunar infarcts and old left cerebellar hemisphere infarct. Electronically Signed   By: Kerby Moors M.D.   On: 03/03/2019 19:42    Pending Labs Unresulted Labs (From admission, onward)   None      Vitals/Pain Today's Vitals   03/03/19 2034 03/03/19 2035 03/03/19 2036 03/03/19 2037  BP:      Pulse: 70 70 72 71  Resp:      Temp:      TempSrc:      SpO2: 92% 92% 93% 92%  Weight:      Height:      PainSc:        Isolation Precautions No active isolations  Medications Medications - No data to display  Mobility walks Low fall risk   Focused Assessments Neuro Assessment Handoff:  Swallow screen pass? Yes    NIH Stroke Scale ( + Modified Stroke Scale Criteria)  Interval: Initial Level of Consciousness (1a.)   : Alert, keenly responsive LOC Questions (1b. )   +: Answers both questions correctly LOC Commands (1c. )   + : Performs both tasks correctly Best Gaze (2. )  +: Normal Visual (3. )  +: No visual  loss Facial Palsy (4. )    : Normal symmetrical movements Motor Arm, Left (5a. )   +: No drift Motor Arm, Right (5b. )   +: No drift Motor Leg, Left (6a. )   +: No drift Motor Leg, Right (6b. )   +: No drift Limb Ataxia (7. ):  Absent Sensory (8. )   +: Normal, no sensory loss Best Language (9. )   +: No aphasia Dysarthria (10. ): Normal Extinction/Inattention (11.)   +: No Abnormality Modified SS Total  +: 0 Complete NIHSS TOTAL: 0     Neuro Assessment:   Neuro Checks:   Initial (03/03/19 2000)  Last Documented NIHSS Modified Score: 0 (03/03/19 2000) Has TPA been given? No If patient is a Neuro Trauma and patient is going to OR before floor call report to [place] nurse: [phone] or [phone]     R Recommendations: See Admitting Provider Note  Report given to:   Additional Notes:

## 2019-03-03 NOTE — ED Provider Notes (Signed)
[facility] EMERGENCY DEPARTMENT Provider Note   CSN: [id number] Arrival date & time: 03/03/19  1901    History   Chief Complaint Chief Complaint  Patient presents with  . Loss of Consciousness    HPI Jacob Gonzales is a 62 y.o. male.     Patient states he supposedly walked around to the other side with car and passed out.  Patient did not have any symptoms prior to passing out.  He does not know how long he was out.  His son arrived him.  Patient was not confused for any length of time afterwards.  Patient has a history of stroke  The history is provided by the patient. No language interpreter was used.  Loss of Consciousness  Episode history:  Single Most recent episode:  Today Duration: Unknown. Timing:  Constant Progression:  Resolved Chronicity:  New Context: not blood draw   Witnessed: no   Relieved by:  Nothing Worsened by:  Nothing Ineffective treatments:  None tried Associated symptoms: no anxiety, no chest pain, no headaches and no seizures     Past Medical History:  Diagnosis Date  . Acute arterial ischemic stroke, vertebrobasilar, thalamic ([place]) 11/17/2015  . Arthritis    Bilateral shoulders, Hands, Knees  . Diabetes ([place]) 11/17/2015  . HTN (hypertension) 11/17/2015    Patient Active Problem List   Diagnosis Date Noted  . Acute arterial ischemic stroke, vertebrobasilar, thalamic ([place]) 11/17/2015  . Diabetes ([place]) 11/17/2015  . HLD (hyperlipidemia) 11/17/2015  . HTN (hypertension) 11/17/2015  . Non compliance w medication regimen 11/17/2015  . CVA (cerebral infarction) 07/11/2015    Past Surgical History:  Procedure Laterality Date  . NO PAST SURGERIES          Home Medications    Prior to Admission medications   Medication Sig Start Date End Date Taking? Authorizing Provider  amLODipine (NORVASC) 10 MG tablet Take 10 mg by mouth daily. for high blood pressure 02/16/19  Yes [provider]  lisinopril  (PRINIVIL,ZESTRIL) 40 MG tablet Take 40 mg by mouth daily. for blood pressure 02/16/19  Yes [provider]  aspirin 81 MG chewable tablet Chew 1 tablet (81 mg total) by mouth daily. Patient not taking: Reported on 03/03/2019 07/12/15   Vaughan Basta, MD  atorvastatin (LIPITOR) 40 MG tablet Take 1 tablet (40 mg total) by mouth daily at 6 PM. Patient not taking: Reported on 03/03/2019 07/12/15   Vaughan Basta, MD  lisinopril (PRINIVIL,ZESTRIL) 10 MG tablet Take 1 tablet (10 mg total) by mouth daily. Patient not taking: Reported on 03/03/2019 07/12/15   Vaughan Basta, MD    Family History Family History  Problem Relation Age of Onset  . Heart attack Mother   . Heart attack Father   . Diabetes Mellitus II Father   . Kidney failure Father   . Uterine cancer Sister   . Heart attack Brother   . Heart attack Maternal Aunt   . Heart attack Maternal Uncle   . Heart attack Paternal Aunt   . Heart attack Paternal Uncle   . Stroke Neg Hx     Social History Social History   Tobacco Use  . Smoking status: Never Smoker  Substance Use Topics  . Alcohol use: No    Alcohol/week: 0.0 standard drinks  . Drug use: No     Allergies   Patient has no known allergies.   Review of Systems Review of Systems  Constitutional: Negative for appetite change and fatigue.  HENT:  Negative for congestion, ear discharge and sinus pressure.   Eyes: Negative for discharge.  Respiratory: Negative for cough.   Cardiovascular: Positive for syncope. Negative for chest pain.  Gastrointestinal: Negative for abdominal pain and diarrhea.  Genitourinary: Negative for frequency and hematuria.  Musculoskeletal: Negative for back pain.  Skin: Negative for rash.  Neurological: Positive for syncope. Negative for seizures and headaches.  Psychiatric/Behavioral: Negative for hallucinations.     Physical Exam Updated Vital Signs BP (!) 131/58   Pulse 71   Temp 97.9 F (36.6 C) (Oral)    Resp 19   Ht 5\' 10"  (1.778 m)   Wt 127 kg   SpO2 92%   BMI 40.18 kg/m   Physical Exam Vitals signs and nursing note reviewed.  Constitutional:      Appearance: He is well-developed.  HENT:     Head: Normocephalic.     Nose: Nose normal.  Eyes:     General: No scleral icterus.    Conjunctiva/sclera: Conjunctivae normal.  Neck:     Musculoskeletal: Neck supple.     Thyroid: No thyromegaly.  Cardiovascular:     Rate and Rhythm: Normal rate and regular rhythm.     Heart sounds: No murmur. No friction rub. No gallop.   Pulmonary:     Breath sounds: No stridor. No wheezing or rales.  Chest:     Chest wall: No tenderness.  Abdominal:     General: There is no distension.     Tenderness: There is no abdominal tenderness. There is no rebound.  Musculoskeletal: Normal range of motion.  Lymphadenopathy:     Cervical: No cervical adenopathy.  Skin:    Findings: No erythema or rash.  Neurological:     Mental Status: He is oriented to person, place, and time.     Motor: No abnormal muscle tone.     Coordination: Coordination normal.  Psychiatric:        Behavior: Behavior normal.      ED Treatments / Results  Labs (all labs ordered are listed, but only abnormal results are displayed) Labs Reviewed  CBC WITH DIFFERENTIAL/PLATELET - Abnormal; Notable for the following components:      Result Value   WBC 11.8 (*)    Monocytes Absolute 1.1 (*)    Abs Immature Granulocytes 0.08 (*)    All other components within normal limits  COMPREHENSIVE METABOLIC PANEL - Abnormal; Notable for the following components:   CO2 20 (*)    Calcium 8.7 (*)    All other components within normal limits  TROPONIN I  CBG MONITORING, ED    EKG EKG Interpretation  Date/Time:  Sunday March 03 2019 19:03:46 EDT Ventricular Rate:  86 PR Interval:    QRS Duration: 107 QT Interval:  382 QTC Calculation: 457 R Axis:   -30 Text Interpretation:  Sinus rhythm Incomplete RBBB and LAFB RSR' in V1  or V2, right VCD or RVH Confirmed by Milton Ferguson [id number]) on 03/03/2019 9:08:53 PM   Radiology Dg Chest 2 View  Result Date: 03/03/2019 CLINICAL DATA:  Syncopal episode with standing. EXAM: CHEST - 2 VIEW COMPARISON:  None. FINDINGS: Minimal atelectasis in the left base. The heart, hila, mediastinum, lungs, and pleura are otherwise unremarkable. IMPRESSION: No active cardiopulmonary disease. Electronically Signed   By: Dorise Bullion III M.D   On: 03/03/2019 19:50   Ct Head Wo Contrast  Result Date: 03/03/2019 CLINICAL DATA:  Altered level of consciousness. EXAM: CT HEAD WITHOUT CONTRAST TECHNIQUE: Contiguous axial images  were obtained from the base of the skull through the vertex without intravenous contrast. COMPARISON:  07/11/2015 FINDINGS: Brain: No evidence of acute infarction, hemorrhage, hydrocephalus, extra-axial collection or mass lesion/mass effect. Chronic right basal ganglia lacunar infarcts are identified with ex vacuo dilatation of the anterior horn of the right lateral ventricle. Old left cerebellar infarct also noted. Vascular: No hyperdense vessel or unexpected calcification. Skull: Normal. Negative for fracture or focal lesion. Sinuses/Orbits: No acute finding. Other: None. IMPRESSION: 1. No acute intracranial abnormality. 2. Chronic right basal ganglia lacunar infarcts and old left cerebellar hemisphere infarct. Electronically Signed   By: Kerby Moors M.D.   On: 03/03/2019 19:42    Procedures Procedures (including critical care time)  Medications Ordered in ED Medications - No data to display   Initial Impression / Assessment and Plan / ED Course  I have reviewed the triage vital signs and the nursing notes.  Pertinent labs & imaging results that were available during my care of the patient were reviewed by me and considered in my medical decision making (see chart for details).        EKG labs unremarkable.  Patient will be admitted for syncope  Final Clinical  Impressions(s) / ED Diagnoses   Final diagnoses:  Syncope and collapse    ED Discharge Orders    None       Milton Ferguson, MD 03/03/19 2124

## 2019-03-03 NOTE — H&P (Addendum)
[facility] Admission History and Physical Service Pager: [phone]  Patient name: Jacob Gonzales Medical record number: [id number] Date of birth: 09-03-1956 Age: 63 y.o. Gender: male  Primary Care Provider: Philmore Pali, NP Consultants: none Code Status: Full  Chief Complaint: Losing consciousness and falling down  Assessment and Plan: Jacob Gonzales is a 63 year old man who presents to the ED following a syncopal episode.  His past medical history is significant for hypertension, diabetes, CVA (2017), HFrEF (45%).  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  The syncopal episode took place after moving from a seated to standing position.  Unclear if there was a prodrome or not, he reported some dizziness to one provider and no prodrome to another. No SOB, palpitation, or chest pain was noted around the time of the episode.  He did note atypical right sided chest pain the day prior to the syncopal episode.  Vitals on admission were totally unremarkable.  Physical exam was notable only for his baseline neurological deficits.  Admission labs were notable for troponin under 0.03, BNP 20.5, normal glucose.  EKG showed no arrhythmias, ST elevations, T wave inversions, Q waves.  Chest x-ray shows no acute cardiopulmonary disease.  Head CT shows no acute hemorrhagic or ischemic infarct.  Differential for syncope at this time includes: Orthostatic hypotension secondary to dehydration, cardiogenic etiology including acute MI, arrhythmia, CHF exacerbation, and dysautonomia remains a possibility though his history appears more consistent with the dehydration or cardiac etiologies.  Will admit for syncope work-up. -Admit to telemetry, attending Dr. Owens Shark -Cardiac monitoring -A.m. EKG -Trend troponins -am BMP, CMP -check TSH -Echocardiogram -Orthostatic vitals -Monitor vitals -up with  assistance -consider holter monitor prior to d/c  Atypical chest pain No history of MI.  He reports right sided dull chest pain radiating to his right arm on 3/28.  The pain appeared to resolve with rest.  No pain noted on 3/29.  This did not happen at the time of his syncopal episode.  EKG on admission showed no T wave inversions, ST changes, Q waves.  Initial troponin was under 0.03.  He has a strong family history of MI in addition to risk factors that include hypertension, diabetes, heart failure.  Will monitor telemetry, troponins and EKG. -Cardiac monitoring -Trend troponins -A.m. EKG -Echocardiogram  Heart failure with reduced ejection fraction, chronic He is not aware of any history of heart failure the chart review shows last echo with an ejection fraction of 45%.  No evidence of fluid overload on exam today.  Home medication includes lisinopril 40 mg.  No kidney injury on admission. BNP not elevated at 20. -Continue lisinopril 40 mg -echo  History of CVA Has history of thalamic stroke in 2016.  His residual deficits include left-sided weakness in upper and lower extremities.  Neurological exam is at baseline today.  Medication includes baby aspirin daily.  Based on discussion with radiologist, there is no evidence of acute stroke on CT head today though it does show progression from his previous head imaging in 2016.  Last note from neurology (2016) notes that he should also be on atorvastatin.  Atorvastatin remains on his medication list though he is not taking it.  It would also be helpful for him to resume use of a statin. -ASA 81 -We will discuss with patient progression of his vascular disease and encourage good hypertension, HLD and diabetes  control to reduce the possibility of stroke in the future.  Hypertension Blood pressure up to 160/81 on admission.  Home medication includes amlodipine 10 mg daily and lisinopril 40 mg daily. -Continue amlodipine 10 mg daily -Lisinopril as  above  Diabetes, type II He reports a history of diabetes for which he has taken one medication previously.  This medication made him feel very weak and forced him to stay in bed for several days.  He decided to stop this medication on his own because of this significant adverse effect.  His PCP is not aware that he stopped his medication.  A1c 6.8 on admission.  Blood glucose 148. -Monitor with daily BMP -Follow-up with PCP for appropriate diabetes management  FEN/GI: Healthy/carb modified Prophylaxis: Lovenox  Disposition: Possible discharge home 3/30 pending normal work up  History of Present Illness:   Jacob Gonzales is a 63 year old man who presents to the ED following a syncopal episode.  His past medical history is significant for hypertension, diabetes, CVA (2017), HFrEF (45%).  He reports that he was going about his usual activity today.  He was sitting in his truck late in the afternoon when he got out of his truck to walk around to the tailgate and felt woozy.  The next thing he remembers is being shaken awake by his son.  He reported no shortness of breath or palpitations prior to passing out.  He denied fecal or urinary incontinence.  He is not sure if he hit his head while falling down.  He denies any new focal deficits or weaknesses.  He reports that he skipped breakfast and lunch today and has had very little to drink except beer.  He was brought to the ED by his family.  He reports 1 previous episode of syncope several years ago shortly following his stroke.  At that time, he decided to get back to work that day and was not evaluated.  He reports right sided chest pain on 3/28.  At that time, he reports a dull chest pain in the right side of his chest with radiation to his right arm.  He spoke with his wife about it when it was bothering him and decided to lay down for the evening.  He denies any ongoing chest pain today (3/29).   On review of systems, he reports upper and lower  extremity weakness on his left side as a result of his stroke in 2017.   Review Of Systems: Per HPI with the following additions:   Review of Systems  Constitutional: Negative for diaphoresis, fever and malaise/fatigue.  HENT: Positive for congestion. Negative for hearing loss and sore throat.   Eyes: Negative for blurred vision and double vision.  Respiratory: Negative for cough and shortness of breath.   Cardiovascular: Positive for chest pain. Negative for palpitations.  Gastrointestinal: Positive for heartburn. Negative for abdominal pain, constipation, diarrhea, nausea and vomiting.  Genitourinary: Negative for dysuria.  Skin: Negative for rash.  Neurological: Negative for tremors, sensory change, focal weakness, weakness and headaches.    Patient Active Problem List   Diagnosis Date Noted  . Syncope 03/03/2019  . Acute arterial ischemic stroke, vertebrobasilar, thalamic ([place]) 11/17/2015  . Diabetes ([place]) 11/17/2015  . HLD (hyperlipidemia) 11/17/2015  . HTN (hypertension) 11/17/2015  . Non compliance w medication regimen 11/17/2015  . CVA (cerebral infarction) 07/11/2015    Past Medical History: Past Medical History:  Diagnosis Date  . Acute arterial ischemic stroke, vertebrobasilar, thalamic ([place]) 11/17/2015  . Arthritis  Bilateral shoulders, Hands, Knees  . Diabetes ([place]) 11/17/2015  . HTN (hypertension) 11/17/2015    Past Surgical History: Past Surgical History:  Procedure Laterality Date  . NO PAST SURGERIES      Social History: Social History   Tobacco Use  . Smoking status: Never Smoker  Substance Use Topics  . Alcohol use: No    Alcohol/week: 0.0 standard drinks  . Drug use: No   Additional social history: lives with wife, children and grandchildren over often.  Family History: Family History  Problem Relation Age of Onset  . Heart attack Mother   . Heart attack Father   . Diabetes Mellitus II Father   . Kidney failure Father   . Uterine  cancer Sister   . Heart attack Brother   . Heart attack Maternal Aunt   . Heart attack Maternal Uncle   . Heart attack Paternal Aunt   . Heart attack Paternal Uncle   . Stroke Neg Hx    Allergies and Medications: No Known Allergies No current facility-administered medications on file prior to encounter.    Current Outpatient Medications on File Prior to Encounter  Medication Sig Dispense Refill  . amLODipine (NORVASC) 10 MG tablet Take 10 mg by mouth daily. for high blood pressure    . lisinopril (PRINIVIL,ZESTRIL) 40 MG tablet Take 40 mg by mouth daily. for blood pressure    . aspirin 81 MG chewable tablet Chew 1 tablet (81 mg total) by mouth daily. (Patient not taking: Reported on 03/03/2019) 30 tablet 3  . atorvastatin (LIPITOR) 40 MG tablet Take 1 tablet (40 mg total) by mouth daily at 6 PM. (Patient not taking: Reported on 03/03/2019) 30 tablet 3  . lisinopril (PRINIVIL,ZESTRIL) 10 MG tablet Take 1 tablet (10 mg total) by mouth daily. (Patient not taking: Reported on 03/03/2019) 30 tablet 3    Objective: BP (!) 131/58   Pulse 71   Temp 97.9 F (36.6 C) (Oral)   Resp 19   Ht 5\' 10"  (1.778 m)   Wt 127 kg   SpO2 92%   BMI 40.18 kg/m   Physical Exam Constitutional:      General: He is not in acute distress.    Appearance: Normal appearance. He is obese. He is not ill-appearing.     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  HENT:     Nose: Nose normal. No rhinorrhea.     Mouth/Throat:     Mouth: Mucous membranes are moist.     Pharynx: No oropharyngeal exudate.  Eyes:     Extraocular Movements: Extraocular movements intact.     Conjunctiva/sclera: Conjunctivae normal.     Pupils: Pupils are equal, round, and reactive to light.  Neck:     Musculoskeletal: Normal range of motion and neck supple. No neck rigidity or muscular tenderness.  Cardiovascular:     Rate and Rhythm: Normal rate and regular rhythm.     Pulses: Normal  pulses.     Heart sounds: Normal heart sounds. No murmur.  Pulmonary:     Effort: Pulmonary effort is normal. No respiratory distress.     Breath sounds: Normal breath sounds. No wheezing or rales.  Abdominal:     General: Bowel sounds are normal.     Palpations: Abdomen is soft.  Musculoskeletal: Normal range of motion.  Skin:    General: Skin is warm and dry.  Neurological:     Mental Status: He is  alert.      Labs and Imaging: CBC BMET  Recent Labs  Lab 03/03/19 1929  WBC 11.8*  HGB 14.7  HCT 44.0  PLT 293   Recent Labs  Lab 03/03/19 1929  NA 135  K 3.7  CL 102  CO2 20*  BUN 12  CREATININE 0.85  GLUCOSE 95  CALCIUM 8.7*      BNP    Component Value Date/Time   BNP 20.5 03/03/2019 2234    ProBNP No results found for: PROBNP  Dg Chest 2 View  Result Date: 03/03/2019 CLINICAL DATA:  Syncopal episode with standing. EXAM: CHEST - 2 VIEW COMPARISON:  None. FINDINGS: Minimal atelectasis in the left base. The heart, hila, mediastinum, lungs, and pleura are otherwise unremarkable. IMPRESSION: No active cardiopulmonary disease. Electronically Signed   By: Dorise Bullion III M.D   On: 03/03/2019 19:50   Ct Head Wo Contrast  Result Date: 03/03/2019 CLINICAL DATA:  Altered level of consciousness. EXAM: CT HEAD WITHOUT CONTRAST TECHNIQUE: Contiguous axial images were obtained from the base of the skull through the vertex without intravenous contrast. COMPARISON:  07/11/2015 FINDINGS: Brain: No evidence of acute infarction, hemorrhage, hydrocephalus, extra-axial collection or mass lesion/mass effect. Chronic right basal ganglia lacunar infarcts are identified with ex vacuo dilatation of the anterior horn of the right lateral ventricle. Old left cerebellar infarct also noted. Vascular: No hyperdense vessel or unexpected calcification. Skull: Normal. Negative for fracture or focal lesion. Sinuses/Orbits: No acute finding. Other: None. IMPRESSION: 1. No acute intracranial  abnormality. 2. Chronic right basal ganglia lacunar infarcts and old left cerebellar hemisphere infarct. Electronically Signed   By: Kerby Moors M.D.   On: 03/03/2019 19:42     Matilde Haymaker, MD 03/03/2019, 9:23 PM PGY-1, [place] Intern pager: [phone], text pages welcome   FPTS Upper-Level Resident Addendum   I have independently interviewed and examined the patient. I have discussed the above with the original author and agree with their documentation. My edits for correction/addition/clarification are in pink. Please see also any attending notes.    Lucila Maine, DO PGY-3, [facility] Service pager: [phone] (text pages welcome through [place])

## 2019-03-03 NOTE — ED Notes (Signed)
Called pt PCP,per request, left message with on call. She stated she will send pcp a message

## 2019-03-03 NOTE — ED Notes (Signed)
Patient transported to CT 

## 2019-03-03 NOTE — ED Triage Notes (Signed)
Pt was in care today, stood up and had syncopal episode. Denies hitting head. During episode pt had chest pain. Chest pain has resolved.

## 2019-03-04 ENCOUNTER — Observation Stay (HOSPITAL_BASED_OUTPATIENT_CLINIC_OR_DEPARTMENT_OTHER): Payer: Medicaid Other

## 2019-03-04 DIAGNOSIS — R55 Syncope and collapse: Secondary | ICD-10-CM

## 2019-03-04 DIAGNOSIS — R0789 Other chest pain: Secondary | ICD-10-CM | POA: Diagnosis not present

## 2019-03-04 DIAGNOSIS — M1990 Unspecified osteoarthritis, unspecified site: Secondary | ICD-10-CM | POA: Diagnosis not present

## 2019-03-04 DIAGNOSIS — E119 Type 2 diabetes mellitus without complications: Secondary | ICD-10-CM | POA: Diagnosis not present

## 2019-03-04 LAB — CBC
HCT: 42.5 % (ref 39.0–52.0)
Hemoglobin: 14.1 g/dL (ref 13.0–17.0)
MCH: 29.3 pg (ref 26.0–34.0)
MCHC: 33.2 g/dL (ref 30.0–36.0)
MCV: 88.2 fL (ref 80.0–100.0)
Platelets: 259 10*3/uL (ref 150–400)
RBC: 4.82 MIL/uL (ref 4.22–5.81)
RDW: 13.5 % (ref 11.5–15.5)
WBC: 9.4 10*3/uL (ref 4.0–10.5)
nRBC: 0 % (ref 0.0–0.2)

## 2019-03-04 LAB — ECHOCARDIOGRAM LIMITED
Height: 70 in
Weight: 4163.2 oz

## 2019-03-04 LAB — BASIC METABOLIC PANEL
Anion gap: 15 (ref 5–15)
BUN: 10 mg/dL (ref 6–20)
CO2: 21 mmol/L — ABNORMAL LOW (ref 22–32)
Calcium: 8.8 mg/dL — ABNORMAL LOW (ref 8.9–10.3)
Chloride: 105 mmol/L (ref 98–111)
Creatinine, Ser: 0.71 mg/dL (ref 0.61–1.24)
GFR calc Af Amer: >60 mL/min (ref >60)
GFR calc non Af Amer: >60 mL/min (ref >60)
Glucose, Bld: 74 mg/dL (ref 70–99)
Potassium: 3.9 mmol/L (ref 3.5–5.1)
SODIUM: 141 mmol/L (ref 135–145)

## 2019-03-04 LAB — TSH: TSH: 1.376 u[IU]/mL (ref 0.350–4.500)

## 2019-03-04 LAB — TROPONIN I
Troponin I: <0.03 ng/mL (ref <0.03)
Troponin I: <0.03 ng/mL (ref <0.03)

## 2019-03-04 LAB — HIV ANTIBODY (ROUTINE TESTING W REFLEX): HIV Screen 4th Generation wRfx: NONREACTIVE

## 2019-03-04 MED ORDER — ATORVASTATIN CALCIUM 40 MG PO TABS: 40.0000 mg | ORAL_TABLET | Freq: Every day | ORAL | Status: DC

## 2019-03-04 NOTE — Progress Notes (Signed)
OT Cancellation Note  Patient Details Name: Rayan Ines MRN: [id number] DOB: 03-04-1956   Cancelled Treatment:    Reason Eval/Treat Not Completed: OT screened, no needs identified, will sign off. Spoke with PT (note to be entered soon) No OT concerns, OT will sign off.   Merri Ray Dezaree Tracey 03/04/2019, 1:36 PM  Hulda Humphrey OTR/L Acute Rehabilitation Services Pager: [phone] Office: [phone]

## 2019-03-04 NOTE — Discharge Summary (Addendum)
FMTS Attending Discharge Note: Dorris Singh, MD  Pager [phone]  Office [phone] I have seen and examined this patient, reviewed their chart. I have discussed this patient with the resident. I agree with the resident's findings, assessment and care plan.  [facility] Discharge Summary  Patient name: Jacob Gonzales Medical record number: [id number] Date of birth: [birth year] Age: 63 y.o. Gender: male Date of Admission: 03/03/2019  Date of Discharge: 03/04/2019 Admitting Physician: Martyn Malay, MD  Primary Care Provider: Philmore Pali, NP Consultants: none  Indication for Hospitalization: syncope  Discharge Diagnoses/Problem List:  Orthostatic syncope Atypical chest pain HFrEF HTN DM-II  Disposition: home  Discharge Condition: improved, stable  Discharge Exam:  BP (!) 133/57 (BP Location: Right Arm)   Pulse 68   Temp 98.5 F (36.9 C) (Oral)   Resp 20   Ht 5\' 10"  (1.778 m)   Wt 118 kg Comment: scale a  SpO2 95%   BMI 37.33 kg/m  Gen: NAD, alert, non-toxic, well-nourished, well-appearing, sitting comfortably .  CV: Regular rate and rhythm.  Normal S1-S2. Radial pulses 2+ bilaterally. No bilateral lower extremity edema. Resp: Clear to auscultation bilaterally.  No wheezing, rales, abnormal lung sounds.  No increased work of breathing appreciated. Abd: Nontender and nondistended on palpation to all 4 quadrants.  Positive bowel sounds. Psych: Cooperative with exam. Pleasant. Makes eye contact.  Brief Hospital Course:  The patient was admitted to the hospital after a syncopal event following a day of being outside with little to eat or drink.  Patient improved with fluid resuscitation.  His troponins were negative, BNP of 20,  no changes to EKG, and an echo in the AM showed normal systolic and diastolic function.  Orthostatic vitals in the AM were normal and patient was discharged after his Echo resulted.    Issues for Follow Up:  1. Syncope -  assess if patient has had recurrent syncopal episodes. This episode was thought to be due to hypovolemia from a combination of poor fluid intake/increased water loss.   2. Chest pain - two days prior to admission patient had minutes of non specific chest pain at home. Patient may benefit from a cardiac stress test.  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 Reviewed return precautions. Ambulatory referral to cardiology was placed.  3. Diabetes mellitus - A1c o 6.8.  Patient not taking his home medication.  Will need to address with patient what caused his noncompliance and if there are suitable alternative medications for him.   Significant Procedures: none  Significant Labs and Imaging:  Recent Labs  Lab 03/03/19 1929 03/04/19 0405  WBC 11.8* 9.4  HGB 14.7 14.1  HCT 44.0 42.5  PLT 293 259   Recent Labs  Lab 03/03/19 1929 03/04/19 0405  NA 135 141  K 3.7 3.9  CL 102 105  CO2 20* 21*  GLUCOSE 95 74  BUN 12 10  CREATININE 0.85 0.71  CALCIUM 8.7* 8.8*  ALKPHOS 72  --   AST 19  --   ALT 17  --   ALBUMIN 3.9  --       Results/Tests Pending at Time of Discharge: none  Discharge Medications:  Allergies as of 03/04/2019   No Known Allergies     Medication List    TAKE these medications   amLODipine 10 MG tablet Commonly known as:  NORVASC Take 10 mg by mouth daily. for high  blood pressure   aspirin 81 MG chewable tablet Chew 1 tablet (81 mg total) by mouth daily.   atorvastatin 40 MG tablet Commonly known as:  LIPITOR Take 1 tablet (40 mg total) by mouth daily at 6 PM.   lisinopril 10 MG tablet Commonly known as:  PRINIVIL,ZESTRIL Take 1 tablet (10 mg total) by mouth daily. What changed:  Another medication with the same name was removed. Continue taking this medication, and follow the directions you see here.       Discharge Instructions: Please refer to Patient Instructions section of EMR for  full details.  Patient was counseled important signs and symptoms that should prompt return to medical care, changes in medications, dietary instructions, activity restrictions, and follow up appointments.   Follow-Up Appointments: Follow-up Information    Philmore Pali, NP. Schedule an appointment as soon as possible for a visit in 1 week(s).   Specialty:  Nurse Practitioner Why:  Please contact your PCP to set up a follow up appointment after your hospitalization Contact information: [place] [address] [phone]           Benay Pike, MD 03/04/2019, 4:37 PM PGY-1, [place]

## 2019-03-04 NOTE — Progress Notes (Signed)
Family Medicine Teaching Service Daily Progress Note Intern Pager: [phone]  Patient name: Jacob Gonzales Medical record number: [id number] Date of birth: July 03, 1956 Age: 63 y.o. Gender: male  Primary Care Provider: Philmore Pali, NP Consultants: none Code Status: Full code  Pt Overview and Major Events to Date:  Hospital Day 0 Admitted: 03/03/2019   Assessment and Plan: Jacob Gonzales is a 63 y.o. male who presented with syncope. PMHx significant for hypertension, DM, CVA (2017), HFrEF (45%)  Orthostatic Syncope - Trop, BNP negative. EKG normal, CXR, CT head negative for acute processes. Episode occurred after a day of labor outside with little to drink.  Likely hypovolemia related.  - trend trops - AM BMP, CBC,  - f/u Echo - get orthostatic vitals - continuous cardiac monitoring.   Atypical Chest pain - right sided chest pain radiating to arm one day prior to syncopal episode that resolved with rest. EKG negative, trop negative. Strong family Hixt[place] of MI - cardiac monitoring  - trend trops - AM EKG - Echo - ambulatory referral to cards  HFrEF - EF 45% on last echo.  On lisinopril 40mg  at home. BNP 20.  - continue lisinopril 40mg  - echo  Hx of CVA - thalamic stroke in 2016 with residual LUE/LLE weaknesss.  Takes low dose aspirin. CT head negative for acute bleed. Not taking his atorvostatin - conitnue ASA  HTN - 160/81 on admission, 145/80 most recently.  On amloidipine 10mg  and lisinopril 40mg  at home - continue home meds  DM-II - not taking home medication.  A1c 6.8.  - daily BMP  Nutrition: heart healthy/carb modified.  GI ppx:  DVT px: lovenox  Disposition: home   Medications: Scheduled Meds: . amLODipine  10 mg Oral Daily  . aspirin  81 mg Oral Daily  . enoxaparin (LOVENOX) injection  40 mg Subcutaneous Daily  . lisinopril  10 mg Oral Daily   Continuous Infusions: PRN Meds: acetaminophen **OR** acetaminophen, alum & mag hydroxide-simeth, ondansetron **OR**  ondansetron (ZOFRAN) IV, polyethylene glycol  ================================================= ================================================= Subjective:  Patient feels better today.  No complaints. No chest pain/vision changes, shortness of breath.    Objective: Temp:  [97.7 F (36.5 C)-98.7 F (37.1 C)] 98.7 F (37.1 C) (03/30 0530) Pulse Rate:  [66-93] 73 (03/30 0530) Resp:  [18-19] 18 (03/30 0530) BP: (126-160)/(58-81) 145/80 (03/30 0530) SpO2:  [91 %-96 %] 96 % (03/30 0530) Weight:  [147 kg-127 kg] 118 kg (03/30 0026) Intake/Output 03/29 0701 - 03/30 0700 In: -  Out: 400 [Urine:400] Physical Exam:  Gen: NAD, alert, non-toxic, well-nourished, well-appearing, sitting comfortably .  CV: Regular rate and rhythm.  Normal S1-S2. Radial pulses 2+ bilaterally. No bilateral lower extremity edema. Resp: Clear to auscultation bilaterally.  No wheezing, rales, abnormal lung sounds.  No increased work of breathing appreciated. Abd: Nontender and nondistended on palpation to all 4 quadrants.  Positive bowel sounds. Psych: Cooperative with exam. Pleasant. Makes eye contact.   Laboratory: Recent Labs  Lab 03/03/19 1929 03/04/19 0405  WBC 11.8* 9.4  HGB 14.7 14.1  HCT 44.0 42.5  PLT 293 259   Recent Labs  Lab 03/03/19 1929 03/04/19 0405  NA 135 141  K 3.7 3.9  CL 102 105  CO2 20* 21*  BUN 12 10  CREATININE 0.85 0.71  CALCIUM 8.7* 8.8*  PROT 7.8  --   BILITOT 0.8  --   ALKPHOS 72  --   ALT 17  --   AST 19  --   GLUCOSE  95 74   Imaging/Diagnostic Tests: Dg Chest 2 View  Result Date: 03/03/2019 CLINICAL DATA:  Syncopal episode with standing. EXAM: CHEST - 2 VIEW COMPARISON:  None. FINDINGS: Minimal atelectasis in the left base. The heart, hila, mediastinum, lungs, and pleura are otherwise unremarkable. IMPRESSION: No active cardiopulmonary disease. Electronically Signed   By: Dorise Bullion III M.D   On: 03/03/2019 19:50   Ct Head Wo Contrast  Result Date:  03/03/2019 CLINICAL DATA:  Altered level of consciousness. EXAM: CT HEAD WITHOUT CONTRAST TECHNIQUE: Contiguous axial images were obtained from the base of the skull through the vertex without intravenous contrast. COMPARISON:  07/11/2015 FINDINGS: Brain: No evidence of acute infarction, hemorrhage, hydrocephalus, extra-axial collection or mass lesion/mass effect. Chronic right basal ganglia lacunar infarcts are identified with ex vacuo dilatation of the anterior horn of the right lateral ventricle. Old left cerebellar infarct also noted. Vascular: No hyperdense vessel or unexpected calcification. Skull: Normal. Negative for fracture or focal lesion. Sinuses/Orbits: No acute finding. Other: None. IMPRESSION: 1. No acute intracranial abnormality. 2. Chronic right basal ganglia lacunar infarcts and old left cerebellar hemisphere infarct. Electronically Signed   By: Kerby Moors M.D.   On: 03/03/2019 19:42      Benay Pike, MD 03/04/2019, 6:12 AM PGY-1, [place] Intern pager: [phone], text pages welcome

## 2019-03-04 NOTE — Progress Notes (Signed)
  Echocardiogram 2D Echocardiogram limited has been performed due to patient contact precautions.   Jacob Gonzales 03/04/2019, 9:13 AM

## 2019-03-04 NOTE — Progress Notes (Signed)
Patient is stable with no dizziness and neg orthostatics waiting ECHO results from MD

## 2019-03-04 NOTE — Discharge Instructions (Signed)
Syncope Syncope is when you pass out (faint) for a short time. It is caused by a sudden decrease in blood flow to the brain. Signs that you may be about to pass out include:  Feeling dizzy or light-headed.  Feeling sick to your stomach (nauseous).  Seeing all white or all black.  Having cold, clammy skin. If you pass out, get help right away. Call your local emergency services (911 in the [place] Do not drive yourself to the hospital. Follow these instructions at home: Watch for any changes in your symptoms. Take these actions to stay safe and help with your symptoms: Lifestyle  Do not drive, use machinery, or play sports until your doctor says it is okay.  Do not drink alcohol.  Do not use any products that contain nicotine or tobacco, such as cigarettes and e-cigarettes. If you need help quitting, ask your doctor.  Drink enough fluid to keep your pee (urine) pale yellow. General instructions  Take over-the-counter and prescription medicines only as told by your doctor.  If you are taking blood pressure or heart medicine, sit up and stand up slowly. Spend a few minutes getting ready to sit and then stand. This can help you feel less dizzy.  Have someone stay with you until you feel stable.  If you start to feel like you might pass out, lie down right away and raise (elevate) your feet above the level of your heart. Breathe deeply and steadily. Wait until all of the symptoms are gone.  Keep all follow-up visits as told by your doctor. This is important. Get help right away if:  You have a very bad headache.  You pass out once or more than once.  You have pain in your chest, belly, or back.  You have a very fast or uneven heartbeat (palpitations).  It hurts to breathe.  You are bleeding from your mouth or your bottom (rectum).  You have black or tarry poop (stool).  You have jerky movements that you cannot control (seizure).  You are confused.  You have trouble  walking.  You are very weak.  You have vision problems. These symptoms may be an emergency. Do not wait to see if the symptoms will go away. Get medical help right away. Call your local emergency services (911 in the [place] Do not drive yourself to the hospital. Summary  Syncope is when you pass out (faint) for a short time. It is caused by a sudden decrease in blood flow to the brain.  Signs that you may be about to faint include feeling dizzy, light-headed, or sick to your stomach, seeing all white or all black, or having cold, clammy skin.  If you start to feel like you might pass out, lie down right away and raise (elevate) your feet above the level of your heart. Breathe deeply and steadily. Wait until all of the symptoms are gone. This information is not intended to replace advice given to you by your health care provider. Make sure you discuss any questions you have with your health care provider. Document Released: 05/09/2008 Document Revised: 01/03/2018 Document Reviewed: 01/03/2018 Elsevier Interactive Patient Education  2019 [employer].

## 2019-03-04 NOTE — Evaluation (Signed)
Physical Therapy Evaluation and Discharge Patient Details Name: Jacob Gonzales MRN: [id number] DOB: 08-Mar-1956 Today's Date: 03/04/2019   History of Present Illness  Pt is a 63 y/o male admitted secondary to syncopal episode. CT negative for acute abnormality. PMH includes HTN, CVA, and DM.   Clinical Impression  Patient evaluated by Physical Therapy with no further acute PT needs identified. All education has been completed and the patient has no further questions. Pt overall steady when performing DGI tasks. Scored 20 on DGI indicating low fall risk. Pt at a supervision to mod I level for mobility tasks. Reports wife is available if needed at d/c. See below for any follow-up Physical Therapy or equipment needs. PT is signing off. Thank you for this referral. If needs change, please re-consult.      Follow Up Recommendations No PT follow up    Equipment Recommendations  None recommended by PT    Recommendations for Other Services       Precautions / Restrictions Precautions Precautions: Fall Restrictions Weight Bearing Restrictions: No      Mobility  Bed Mobility Overal bed mobility: Independent                Transfers Overall transfer level: Modified independent                  Ambulation/Gait Ambulation/Gait assistance: Supervision Gait Distance (Feet): 200 Feet Assistive device: None Gait Pattern/deviations: Step-through pattern;Decreased stride length Gait velocity: Decreased   General Gait Details: Overall steady gait. Able to perform dynamic gait tasks without LOB.   Stairs Stairs: Yes Stairs assistance: Supervision Stair Management: One rail Left;Alternating pattern;Step to pattern;Forwards Number of Stairs: 4 General stair comments: Used alternating pattern for ascending steps and step to pattern for descending steps. No overt LOB noted. Supervision for safety.   Wheelchair Mobility    Modified Rankin (Stroke Patients Only)        Balance Overall balance assessment: Needs assistance Sitting-balance support: No upper extremity supported;Feet supported Sitting balance-Leahy Scale: Normal     Standing balance support: No upper extremity supported;During functional activity Standing balance-Leahy Scale: Good                   Standardized Balance Assessment Standardized Balance Assessment : Dynamic Gait Index   Dynamic Gait Index Level Surface: Normal Change in Gait Speed: Mild Impairment Gait with Horizontal Head Turns: Normal Gait with Vertical Head Turns: Normal Gait and Pivot Turn: Mild Impairment Step Over Obstacle: Normal Step Around Obstacles: Normal Steps: Moderate Impairment Total Score: 20       Pertinent Vitals/Pain Pain Assessment: No/denies pain    Home Living Family/patient expects to be discharged to:: Private residence Living Arrangements: Spouse/significant other Available Help at Discharge: Family;Available 24 hours/day Type of Home: House Home Access: Stairs to enter Entrance Stairs-Rails: Right;Left;Can reach both Entrance Stairs-Number of Steps: 4 Home Layout: One level Home Equipment: None      Prior Function Level of Independence: Independent         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, newspaper        Extremity/Trunk Assessment   Upper Extremity Assessment Upper Extremity Assessment: Overall WFL for tasks assessed    Lower Extremity Assessment Lower Extremity Assessment: Overall WFL for tasks assessed    Cervical / Trunk Assessment Cervical / Trunk Assessment: Normal  Communication   Communication: No difficulties  Cognition Arousal/Alertness: Awake/alert Behavior During Therapy: WFL for tasks assessed/performed Overall Cognitive Status: Within Functional  Limits for tasks assessed                                        General Comments      Exercises     Assessment/Plan    PT Assessment Patent does not need any  further PT services  PT Problem List         PT Treatment Interventions      PT Goals (Current goals can be found in the Care Plan section)  Acute Rehab PT Goals Patient Stated Goal: to go home  PT Goal Formulation: With patient Time For Goal Achievement: 03/04/19 Potential to Achieve Goals: Good    Frequency     Barriers to discharge        Co-evaluation               AM-PAC PT "6 Clicks" Mobility  Outcome Measure Help needed turning from your back to your side while in a flat bed without using bedrails?: None Help needed moving from lying on your back to sitting on the side of a flat bed without using bedrails?: None Help needed moving to and from a bed to a chair (including a wheelchair)?: None Help needed standing up from a chair using your arms (e.g., wheelchair or bedside chair)?: None Help needed to walk in hospital room?: None Help needed climbing 3-5 steps with a railing? : None 6 Click Score: 24    End of Session   Activity Tolerance: Patient tolerated treatment well Patient left: in bed;with call bell/phone within reach Nurse Communication: Mobility status PT Visit Diagnosis: Other abnormalities of gait and mobility (R26.89)    Time: 9532-0233 PT Time Calculation (min) (ACUTE ONLY): 11 min   Charges:   PT Evaluation $PT Eval Low Complexity: [place], PT, DPT  Acute Rehabilitation Services  Pager: [phone] Office: [phone]   Rudean Hitt 03/04/2019, 1:55 PM

## 2019-04-25 ENCOUNTER — Telehealth: Payer: Self-pay | Admitting: Nurse Practitioner

## 2019-04-25 NOTE — Telephone Encounter (Signed)
Called patient regarding his appointment. He states he does not have a Smart phone or a computer/tablet or other device that would allow for a virtual visit. Patient consents to telephone visit. He states he thinks he passed out because of his BP medication being taken together. Denies syncope or any concerns since that time. I asked him to try to get a BP cuff to keep at home.  YOUR CARDIOLOGY TEAM HAS ARRANGED FOR AN E-VISIT FOR YOUR APPOINTMENT - PLEASE REVIEW IMPORTANT INFORMATION BELOW SEVERAL DAYS PRIOR TO YOUR APPOINTMENT  Due to the recent COVID-19 pandemic, we are transitioning in-person office visits to tele-medicine visits in an effort to decrease unnecessary exposure to our patients, their families, and staff. These visits are billed to your insurance just like a normal visit is. We also encourage you to sign up for MyChart if you have not already done so. You will need a smartphone if possible. For patients that do not have this, we can still complete the visit using a regular telephone but do prefer a smartphone to enable video when possible. You may have a family member that lives with you that can help. If possible, we also ask that you have a blood pressure cuff and scale at home to measure your blood pressure, heart rate and weight prior to your scheduled appointment. Patients with clinical needs that need an in-person evaluation and testing will still be able to come to the office if absolutely necessary. If you have any questions, feel free to call our office.     YOUR PROVIDER WILL BE USING THE FOLLOWING PLATFORM TO COMPLETE YOUR VISIT: telephone   2-3 DAYS BEFORE YOUR APPOINTMENT  You will receive a telephone call from one of our [facility] team members - your caller ID may say "Unknown caller." If this is a video visit, we will walk you through how to get the video launched on your phone. We will remind you check your blood pressure, heart rate and weight prior to your scheduled  appointment. If you have an Apple Watch or Kardia, please upload any pertinent ECG strips the day before or morning of your appointment to [facility]. Our staff will also make sure you have reviewed the consent and agree to move forward with your scheduled tele-health visit.     THE DAY OF YOUR APPOINTMENT  Approximately 15 minutes prior to your scheduled appointment, you will receive a telephone call from one of [facility] team - your caller ID may say "Unknown caller."  Our staff will confirm medications, vital signs for the day and any symptoms you may be experiencing. Please have this information available prior to the time of visit start. It may also be helpful for you to have a pad of paper and pen handy for any instructions given during your visit. They will also walk you through joining the smartphone meeting if this is a video visit.    CONSENT FOR TELE-HEALTH VISIT - PLEASE REVIEW  I hereby voluntarily request, consent and authorize [facility] and its employed or contracted physicians, physician assistants, nurse practitioners or other licensed health care professionals (the Practitioner), to provide me with telemedicine health care services (the "Services") as deemed necessary by the treating Practitioner. I acknowledge and consent to receive the Services by the Practitioner via telemedicine. I understand that the telemedicine visit will involve communicating with the Practitioner through live audiovisual communication technology and the disclosure of certain medical information by electronic transmission. I acknowledge that I have been given  the opportunity to request an in-person assessment or other available alternative prior to the telemedicine visit and am voluntarily participating in the telemedicine visit.  I understand that I have the right to withhold or withdraw my consent to the use of telemedicine in the course of my care at any time, without affecting my right to future care or  treatment, and that the Practitioner or I may terminate the telemedicine visit at any time. I understand that I have the right to inspect all information obtained and/or recorded in the course of the telemedicine visit and may receive copies of available information for a reasonable fee.  I understand that some of the potential risks of receiving the Services via telemedicine include:  Marland Kitchen Delay or interruption in medical evaluation due to technological equipment failure or disruption; . Information transmitted may not be sufficient (e.g. poor resolution of images) to allow for appropriate medical decision making by the Practitioner; and/or  . In rare instances, security protocols could fail, causing a breach of personal health information.  Furthermore, I acknowledge that it is my responsibility to provide information about my medical history, conditions and care that is complete and accurate to the best of my ability. I acknowledge that Practitioner's advice, recommendations, and/or decision may be based on factors not within their control, such as incomplete or inaccurate data provided by me or distortions of diagnostic images or specimens that may result from electronic transmissions. I understand that the practice of medicine is not an exact science and that Practitioner makes no warranties or guarantees regarding treatment outcomes. I acknowledge that I will receive a copy of this consent concurrently upon execution via email to the email address I last provided but may also request a printed copy by calling the office of [facility].    I understand that my insurance will be billed for this visit.   I have read or had this consent read to me. . I understand the contents of this consent, which adequately explains the benefits and risks of the Services being provided via telemedicine.  . I have been provided ample opportunity to ask questions regarding this consent and the Services and have had my  questions answered to my satisfaction. . I give my informed consent for the services to be provided through the use of telemedicine in my medical care  By participating in this telemedicine visit I agree to the above.

## 2019-04-29 NOTE — Progress Notes (Deleted)
{Choose 1 Note Type (Telehealth Visit or Telephone Visit):[phone]}  2 Date:  04/29/2019   ID:  Jacob Gonzales, DOB Jan 20, 1956, MRN [id number]  Patient Location: Home Provider Location: Office  PCP:   Cardiologist:   New to [place]  Electrophysiologist:  None   Evaluation Performed:  Consultation - Jacob Gonzales was referred by Dr. Owens Shark  for the evaluation of  Syncope .  Chief Complaint:  Syncope   History of Present Illness:    Jacob Gonzales is a 63 y.o. male with ***  The patient {does/does not:200015} have symptoms concerning for COVID-19 infection (fever, chills, cough, or new shortness of breath).    Past Medical History:  Diagnosis Date  . Acute arterial ischemic stroke, vertebrobasilar, thalamic ([place]) 11/17/2015  . Arthritis    Bilateral shoulders, Hands, Knees  . Bone cancer ([place]) 2016  . Brain cancer ([place]) 2016  . Diabetes ([place]) 11/17/2015  . HTN (hypertension) 11/17/2015   Past Surgical History:  Procedure Laterality Date  . NO PAST SURGERIES       No outpatient medications have been marked as taking for the 04/30/19 encounter (Appointment) with Alejandrina Raimer, Wonda Cheng, MD.     Allergies:   Patient has no known allergies.   Social History   Tobacco Use  . Smoking status: Never Smoker  . Smokeless tobacco: Never Used  Substance Use Topics  . Alcohol use: No    Alcohol/week: 0.0 standard drinks  . Drug use: No     Family Hx: The patient's family history includes Diabetes Mellitus II in his father; Heart attack in his brother, father, maternal aunt, maternal uncle, mother, paternal aunt, and paternal uncle; Kidney failure in his father; Uterine cancer in his sister. There is no history of Stroke.  ROS:   Please see the history of present illness.    *** All other systems reviewed and are negative.   Prior CV studies:   The following studies were reviewed today:  ***  Labs/Other Tests and Data Reviewed:    EKG:  {EKG/Telemetry Strips  Reviewed:[phone]}  Recent Labs: 03/03/2019: ALT 17; B Natriuretic Peptide 20.5; TSH 1.376 03/04/2019: BUN 10; Creatinine, Ser 0.71; Hemoglobin 14.1; Platelets 259; Potassium 3.9; Sodium 141   Recent Lipid Panel Lab Results  Component Value Date/Time   CHOL 218 (H) 07/11/2015 08:16 AM   TRIG 128 07/11/2015 08:16 AM   HDL 34 (L) 07/11/2015 08:16 AM   CHOLHDL 6.4 07/11/2015 08:16 AM   LDLCALC 158 (H) 07/11/2015 08:16 AM    Wt Readings from Last 3 Encounters:  03/04/19 260 lb 3.2 oz (118 kg)  11/17/15 284 lb 6.4 oz (129 kg)  07/11/15 250 lb (113.4 kg)     Objective:    Vital Signs:  There were no vitals taken for this visit.   {[facility] Virtual Exam (Optional):[phone]::"VITAL SIGNS:  reviewed"}  ASSESSMENT & PLAN:    1. ***  COVID-19 Education: The signs and symptoms of COVID-19 were discussed with the patient and how to seek care for testing (follow up with PCP or arrange E-visit).  ***The importance of social distancing was discussed today.  Time:   Today, I have spent *** minutes with the patient with telehealth technology discussing the above problems.     Medication Adjustments/Labs and Tests Ordered: Current medicines are reviewed at length with the patient today.  Concerns regarding medicines are outlined above.   Tests Ordered: No orders of the defined types were placed in this encounter.   Medication Changes: No orders of  the defined types were placed in this encounter.   Disposition:  Follow up {follow up:15908}  Signed, Mertie Moores, MD  04/29/2019 8:42 PM    [facility]

## 2019-04-30 ENCOUNTER — Telehealth: Payer: Medicaid Other | Admitting: Cardiovascular Disease

## 2019-04-30 ENCOUNTER — Other Ambulatory Visit: Payer: Self-pay

## 2019-05-02 ENCOUNTER — Ambulatory Visit: Payer: Medicaid Other | Admitting: Cardiovascular Disease

## 2019-06-03 ENCOUNTER — Emergency Department (HOSPITAL_COMMUNITY): Payer: Medicaid Other

## 2019-06-03 ENCOUNTER — Emergency Department (HOSPITAL_COMMUNITY)
Admission: EM | Admit: 2019-06-03 | Discharge: 2019-06-04 | Disposition: A | Discharge status: Home / Self Care | Payer: Medicaid Other | Attending: Emergency Medicine | Admitting: Emergency Medicine

## 2019-06-03 ENCOUNTER — Other Ambulatory Visit: Payer: Self-pay

## 2019-06-03 ENCOUNTER — Encounter (HOSPITAL_COMMUNITY): Payer: Self-pay | Admitting: *Deleted

## 2019-06-03 DIAGNOSIS — W500XXA Accidental hit or strike by another person, initial encounter: Secondary | ICD-10-CM | POA: Insufficient documentation

## 2019-06-03 DIAGNOSIS — S0101XA Laceration without foreign body of scalp, initial encounter: Secondary | ICD-10-CM

## 2019-06-03 DIAGNOSIS — Z79899 Other long term (current) drug therapy: Secondary | ICD-10-CM | POA: Diagnosis not present

## 2019-06-03 DIAGNOSIS — Z7982 Long term (current) use of aspirin: Secondary | ICD-10-CM | POA: Diagnosis not present

## 2019-06-03 DIAGNOSIS — I10 Essential (primary) hypertension: Secondary | ICD-10-CM | POA: Insufficient documentation

## 2019-06-03 DIAGNOSIS — E119 Type 2 diabetes mellitus without complications: Secondary | ICD-10-CM | POA: Diagnosis not present

## 2019-06-03 DIAGNOSIS — Y939 Activity, unspecified: Secondary | ICD-10-CM | POA: Insufficient documentation

## 2019-06-03 DIAGNOSIS — Y999 Unspecified external cause status: Secondary | ICD-10-CM | POA: Insufficient documentation

## 2019-06-03 DIAGNOSIS — Z8673 Personal history of transient ischemic attack (TIA), and cerebral infarction without residual deficits: Secondary | ICD-10-CM | POA: Diagnosis not present

## 2019-06-03 DIAGNOSIS — Y929 Unspecified place or not applicable: Secondary | ICD-10-CM | POA: Diagnosis not present

## 2019-06-03 DIAGNOSIS — S0990XA Unspecified injury of head, initial encounter: Secondary | ICD-10-CM | POA: Diagnosis present
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
## 2019-06-04 ENCOUNTER — Telehealth: Payer: Self-pay | Admitting: *Deleted

## 2019-06-04 MED ORDER — LIDOCAINE-EPINEPHRINE (PF) 2 %-1:200000 IJ SOLN
10.0000 mL | Freq: Once | INTRAMUSCULAR | Status: AC
  Administered 2019-06-04: 10 mL
  Filled 2019-06-04: qty 20

## 2019-06-04 NOTE — Telephone Encounter (Signed)
According to chart, previously scheduled visit (5/26) was going to be a telephone visit.

## 2019-06-04 NOTE — Telephone Encounter (Signed)
Attempted to reach pt to discuss upcoming virtual visit. VM full,unable to leave a message.  Pt was scheduled with Dr. Acie Fredrickson and so consent has already been obtained.  However, that appointment is documented as a no show.

## 2019-06-04 NOTE — ED Notes (Signed)
Patient verbalizes understanding of discharge instructions. Opportunity for questioning and answers were provided. Armband removed by staff, pt discharged from ED.  
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
## 2019-06-05 ENCOUNTER — Telehealth: Payer: Self-pay | Admitting: Internal Medicine
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
## 2019-06-05 NOTE — Telephone Encounter (Signed)
New Message    Attempted to contact pt to confirm appt  Unable to leave message due to full voicemail

## 2019-06-06 ENCOUNTER — Other Ambulatory Visit: Payer: Self-pay

## 2019-06-06 ENCOUNTER — Encounter: Payer: Self-pay | Admitting: Internal Medicine

## 2019-06-06 ENCOUNTER — Telehealth (INDEPENDENT_AMBULATORY_CARE_PROVIDER_SITE_OTHER): Payer: Medicaid Other | Admitting: Internal Medicine

## 2019-06-06 VITALS — Ht 71.0 in | Wt 271.0 lb

## 2019-06-06 DIAGNOSIS — E782 Mixed hyperlipidemia: Secondary | ICD-10-CM | POA: Diagnosis not present

## 2019-06-06 DIAGNOSIS — I10 Essential (primary) hypertension: Secondary | ICD-10-CM | POA: Diagnosis not present

## 2019-06-06 DIAGNOSIS — R55 Syncope and collapse: Secondary | ICD-10-CM

## 2019-06-06 NOTE — Patient Instructions (Signed)
Medication Instructions:  Your physician recommends that you continue on your current medications as directed. Please refer to the Current Medication list given to you today.  If you need a refill on your cardiac medications before your next appointment, please call your pharmacy.   Lab work: None Ordered  If you have labs (blood work) drawn today and your tests are completely normal, you will receive your results only by: Marland Kitchen MyChart Message (if you have MyChart) OR . A paper copy in the mail If you have any lab test that is abnormal or we need to change your treatment, we will call you to review the results.  Testing/Procedures: None ordered  Follow-Up: . Follow up with Dr. Harrington Challenger in August via [place]. Please call the office to arrange.   Any Other Special Instructions Will Be Listed Below (If Applicable).  We will request additional records from Charlott Holler, NP

## 2019-06-07 NOTE — Progress Notes (Signed)
   Cardiology Office Note   Date:  06/07/2019   ID:  Jacob Gonzales, DOB [birth year], MRN [id number]  PCP:  Philmore Pali, NP  Cardiologist:   Dorris Carnes, MD       History of Present Illness: Jacob Gonzales is a 63 y.o. male with a history of       Current Meds  Medication Sig  . amLODipine (NORVASC) 10 MG tablet Take 10 mg by mouth daily. for high blood pressure  . aspirin 81 MG chewable tablet Chew 1 tablet (81 mg total) by mouth daily.  Marland Kitchen lisinopril (PRINIVIL,ZESTRIL) 10 MG tablet Take 1 tablet (10 mg total) by mouth daily. (Patient taking differently: Take 40 mg by mouth daily. )     Allergies:   Patient has no known allergies.   Past Medical History:  Diagnosis Date  . Acute arterial ischemic stroke, vertebrobasilar, thalamic ([place]) 11/17/2015  . Arthritis    Bilateral shoulders, Hands, Knees  . Bone cancer ([place]) 2016  . Brain cancer ([place]) 2016  . Diabetes ([place]) 11/17/2015  . HTN (hypertension) 11/17/2015    Past Surgical History:  Procedure Laterality Date  . NO PAST SURGERIES       Social History:  The patient  reports that he has never smoked. He has never used smokeless tobacco. He reports that he does not drink alcohol or use drugs.   Family History:  The patient's family history includes Diabetes Mellitus II in his father; Heart attack in his brother, father, maternal aunt, maternal uncle, mother, paternal aunt, and paternal uncle; Kidney failure in his father; Uterine cancer in his sister.    ROS:  Please see the history of present illness. All other systems are reviewed and  Negative to the above problem except as noted.    PHYSICAL EXAM: VS:  Ht 5\' 11"  (1.803 m)   Wt 271 lb (122.9 kg)   BMI 37.80 kg/m   GEN: Well nourished, well developed, in no acute distress  HEENT: normal  Neck: no JVD, carotid bruits, or masses Cardiac: RRR; no murmurs, rubs, or gallops,no edema  Respiratory:  clear to auscultation bilaterally, normal work of breathing GI:  soft, nontender, nondistended, + BS  No hepatomegaly  MS: no deformity Moving all extremities   Skin: warm and dry, no rash Neuro:  Strength and sensation are intact Psych: euthymic mood, full affect   EKG:  EKG is ordered today.   Lipid Panel    Component Value Date/Time   CHOL 218 (H) 07/11/2015 0816   TRIG 128 07/11/2015 0816   HDL 34 (L) 07/11/2015 0816   CHOLHDL 6.4 07/11/2015 0816   VLDL 26 07/11/2015 0816   LDLCALC 158 (H) 07/11/2015 0816      Wt Readings from Last 3 Encounters:  06/06/19 271 lb (122.9 kg)  06/03/19 260 lb (117.9 kg)  03/04/19 260 lb 3.2 oz (118 kg)      ASSESSMENT AND PLAN:     Current medicines are reviewed at length with the patient today.  The patient does not have concerns regarding medicines.  Signed, Dorris Carnes, MD  06/07/2019 8:22 AM    [facility] [place], [place], [place]  [address] Phone: [phone]; Fax: [phone]

## 2020-01-05 NOTE — Progress Notes (Unsigned)
Cardiology Office Note   Date:  01/05/2020   ID:  Jacob Gonzales, DOB [birth year], MRN [id number]  PCP:  Philmore Pali, NP  Cardiologist:  Dr. Harrington Challenger    No chief complaint on file.     History of Present Illness: Jacob Gonzales is a 64 y.o. male who presents for ***  history of DM, HTN who was admitted in March with syncope  The pt had little to eat/drink the day of admit   Had been outside CO[place] up [place] from cows  Got dizzy    Went to truck  Sat down   Then got up   [facility] in front of truck  Passed out in front of car   Son was there  Taken to [facility] normal    FLuids    Echo normal  He had an episode of CP 2 days prior to admit   Nonspecific  Carries 50 lb bags of feed   Pain R sided  When not having dizzy spells the patient says he is very acitve     Pt had a stroke a couple years ago   Treated at [facility]   L side of body numb since  Very weak   FOllows with L Lam     Since discharge no severe spell .   Saturday had spell   Sat down for 30 minutes  Felt OK Lst Saturday got real dizzy    COming in to house   Had been doing chores Drinks diet soda a lot during day   Gen just has dinner  (one meal per day) May get sandwich in mid afternoon.  Last saw Dr. Harrington Challenger 06/06/19 with orthostatic hypotension.  Will keep BP log   Pt with + ANA may be PMR    Past Medical History:  Diagnosis Date  . Acute arterial ischemic stroke, vertebrobasilar, thalamic ([place]) 11/17/2015  . Arthritis    Bilateral shoulders, Hands, Knees  . Bone cancer ([place]) 2016  . Brain cancer ([place]) 2016  . Diabetes ([place]) 11/17/2015  . HTN (hypertension) 11/17/2015    Past Surgical History:  Procedure Laterality Date  . NO PAST SURGERIES       Current Outpatient Medications  Medication Sig Dispense Refill  . amLODipine (NORVASC) 10 MG tablet Take 10 mg by mouth daily. for high blood pressure    . aspirin 81 MG chewable tablet Chew 1 tablet (81 mg total) by mouth daily. 30 tablet 3  . lisinopril  (PRINIVIL,ZESTRIL) 10 MG tablet Take 1 tablet (10 mg total) by mouth daily. (Patient taking differently: Take 40 mg by mouth daily. ) 30 tablet 3   No current facility-administered medications for this visit.    Allergies:   Patient has no known allergies.    Social History:  The patient  reports that he has never smoked. He has never used smokeless tobacco. He reports that he does not drink alcohol or use drugs.   Family History:  The patient's ***family history includes Diabetes Mellitus II in his father; Heart attack in his brother, father, maternal aunt, maternal uncle, mother, paternal aunt, and paternal uncle; Kidney failure in his father; Uterine cancer in his sister.    ROS:  General:no colds or fevers, no weight changes Skin:no rashes or ulcers HEENT:no blurred vision, no congestion CV:see HPI PUL:see HPI GI:no diarrhea constipation or melena, no indigestion GU:no hematuria, no dysuria MS:no joint pain, no claudication Neuro:no syncope, no lightheadedness Endo:no diabetes, no thyroid disease  Wt Readings from Last 3 Encounters:  06/06/19 271 lb (122.9 kg)  06/03/19 260 lb (117.9 kg)  03/04/19 260 lb 3.2 oz (118 kg)     PHYSICAL EXAM: VS:  There were no vitals taken for this visit. , BMI There is no height or weight on file to calculate BMI. General:Pleasant affect, NAD Skin:Warm and dry, brisk capillary refill HEENT:normocephalic, sclera clear, mucus membranes moist Neck:supple, no JVD, no bruits  Heart:S1S2 RRR without murmur, gallup, rub or click Lungs:clear without rales, rhonchi, or wheezes CXK:GYJE, non tender, + BS, do not palpate liver spleen or masses Ext:no lower ext edema, 2+ pedal pulses, 2+ radial pulses Neuro:alert and oriented, MAE, follows commands, + facial symmetry    EKG:  EKG is ordered today. The ekg ordered today demonstrates ***   Recent Labs: 03/03/2019: ALT 17; B Natriuretic Peptide 20.5; TSH 1.376 03/04/2019: BUN 10; Creatinine, Ser  0.71; Hemoglobin 14.1; Platelets 259; Potassium 3.9; Sodium 141    Lipid Panel    Component Value Date/Time   CHOL 218 (H) 07/11/2015 0816   TRIG 128 07/11/2015 0816   HDL 34 (L) 07/11/2015 0816   CHOLHDL 6.4 07/11/2015 0816   VLDL 26 07/11/2015 0816   LDLCALC 158 (H) 07/11/2015 0816       Other studies Reviewed: Additional studies/ records that were reviewed today include: ***. Echo 03/04/19 IMPRESSIONS    1. The left ventricle has normal systolic function, with an ejection  fraction of 55-60%. The cavity size was normal. There is moderately  increased left ventricular wall thickness. Left ventricular diastolic  parameters were normal.  2. The right ventricle has normal systolic function. The cavity was  normal. There is no increase in right ventricular wall thickness.  3. Left atrial size was moderately dilated.  4. Trivial pericardial effusion is present.  5. Mild thickening of the mitral valve leaflet.  6. The aortic valve is tricuspid. Mild thickening of the aortic valve.  Mild calcification of the aortic valve.   FINDINGS  Left Ventricle: The left ventricle has normal systolic function, with an  ejection fraction of 55-60%. The cavity size was normal. There is  moderately increased left ventricular wall thickness. Left ventricular  diastolic parameters were normal.  Right Ventricle: The right ventricle has normal systolic function. The  cavity was normal. There is no increase in right ventricular wall  thickness.  Left Atrium: left atrial size was moderately dilated  Left Atrial Appendage:  Right Atrium: right atrial size was normal in size.  Interatrial Septum: No atrial level shunt detected by color flow Doppler.  Pericardium: Trivial pericardial effusion is present. Trivial pericardial  effusion lateral to the RV free wall.  Mitral Valve: The mitral valve is normal in structure. Mild thickening of  the mitral valve leaflet. Mitral valve regurgitation is  trivial by color  flow Doppler.  Tricuspid Valve: The tricuspid valve is normal in structure. Tricuspid  valve regurgitation is trivial by color flow Doppler.  Aortic Valve: The aortic valve is tricuspid Mild thickening of the aortic  valve. Mild calcification of the aortic valve. Aortic valve regurgitation  was not visualized by color flow Doppler. There is no evidence of aortic  valve stenosis.  Pulmonic Valve: The pulmonic valve was grossly normal. Pulmonic valve  regurgitation is trivial by color flow Doppler.    LEFT VENTRICLE  PLAX 2D  LVIDd:     5.37 cm  LVIDs:     3.64 cm  LV PW:     1.28  cm  LV IVS:    1.41 cm  LV SV:     84 ml  LV SV Index:  33.97   LEFT ATRIUM     Index  LA diam:  4.40 cm 1.88 cm/m    AORTA  Ao Root diam: 3.30 cm   ASSESSMENT AND PLAN:  1.  ***   Current medicines are reviewed with the patient today.  The patient Has no concerns regarding medicines.  The following changes have been made:  See above Labs/ tests ordered today include:see above  Disposition:   FU:  see above  Signed, Cecilie Kicks, NP  01/05/2020 10:06 PM    [address], [place], [place], [place] Phone: [phone]; Fax: [phone]

## 2020-01-06 ENCOUNTER — Other Ambulatory Visit: Payer: Self-pay

## 2020-01-06 ENCOUNTER — Telehealth: Payer: Medicaid Other | Admitting: Cardiology

## 2021-01-15 ENCOUNTER — Emergency Department (HOSPITAL_COMMUNITY): Payer: Medicaid Other

## 2021-01-15 ENCOUNTER — Inpatient Hospital Stay (HOSPITAL_COMMUNITY)
Admission: EM | Disposition: A | Discharge status: Home / Self Care | Payer: Self-pay | Source: Home / Self Care | Attending: Internal Medicine

## 2021-01-15 ENCOUNTER — Other Ambulatory Visit: Payer: Self-pay

## 2021-01-15 ENCOUNTER — Inpatient Hospital Stay (HOSPITAL_COMMUNITY): Payer: Medicaid Other

## 2021-01-15 ENCOUNTER — Encounter (HOSPITAL_COMMUNITY): Payer: Self-pay | Admitting: Emergency Medicine

## 2021-01-15 ENCOUNTER — Inpatient Hospital Stay (HOSPITAL_COMMUNITY)
Admission: EM | Admit: 2021-01-15 | Discharge: 2021-01-16 | DRG: 246 | Disposition: A | Discharge status: Home / Self Care | Payer: Medicaid Other | Attending: Internal Medicine | Admitting: Internal Medicine

## 2021-01-15 DIAGNOSIS — I69354 Hemiplegia and hemiparesis following cerebral infarction affecting left non-dominant side: Secondary | ICD-10-CM | POA: Diagnosis not present

## 2021-01-15 DIAGNOSIS — I110 Hypertensive heart disease with heart failure: Secondary | ICD-10-CM | POA: Diagnosis present

## 2021-01-15 DIAGNOSIS — M19012 Primary osteoarthritis, left shoulder: Secondary | ICD-10-CM | POA: Diagnosis present

## 2021-01-15 DIAGNOSIS — Z79899 Other long term (current) drug therapy: Secondary | ICD-10-CM

## 2021-01-15 DIAGNOSIS — N401 Enlarged prostate with lower urinary tract symptoms: Secondary | ICD-10-CM | POA: Diagnosis present

## 2021-01-15 DIAGNOSIS — I2510 Atherosclerotic heart disease of native coronary artery without angina pectoris: Secondary | ICD-10-CM | POA: Diagnosis not present

## 2021-01-15 DIAGNOSIS — E785 Hyperlipidemia, unspecified: Secondary | ICD-10-CM | POA: Diagnosis present

## 2021-01-15 DIAGNOSIS — Z85841 Personal history of malignant neoplasm of brain: Secondary | ICD-10-CM

## 2021-01-15 DIAGNOSIS — R591 Generalized enlarged lymph nodes: Secondary | ICD-10-CM

## 2021-01-15 DIAGNOSIS — Z833 Family history of diabetes mellitus: Secondary | ICD-10-CM

## 2021-01-15 DIAGNOSIS — M19041 Primary osteoarthritis, right hand: Secondary | ICD-10-CM | POA: Diagnosis present

## 2021-01-15 DIAGNOSIS — I313 Pericardial effusion (noninflammatory): Secondary | ICD-10-CM

## 2021-01-15 DIAGNOSIS — R918 Other nonspecific abnormal finding of lung field: Secondary | ICD-10-CM | POA: Diagnosis present

## 2021-01-15 DIAGNOSIS — I472 Ventricular tachycardia: Secondary | ICD-10-CM | POA: Diagnosis present

## 2021-01-15 DIAGNOSIS — Z66 Do not resuscitate: Secondary | ICD-10-CM | POA: Diagnosis present

## 2021-01-15 DIAGNOSIS — R911 Solitary pulmonary nodule: Secondary | ICD-10-CM

## 2021-01-15 DIAGNOSIS — R778 Other specified abnormalities of plasma proteins: Secondary | ICD-10-CM

## 2021-01-15 DIAGNOSIS — E1159 Type 2 diabetes mellitus with other circulatory complications: Secondary | ICD-10-CM | POA: Diagnosis not present

## 2021-01-15 DIAGNOSIS — R079 Chest pain, unspecified: Secondary | ICD-10-CM | POA: Diagnosis not present

## 2021-01-15 DIAGNOSIS — Z20822 Contact with and (suspected) exposure to covid-19: Secondary | ICD-10-CM | POA: Diagnosis present

## 2021-01-15 DIAGNOSIS — I214 Non-ST elevation (NSTEMI) myocardial infarction: Principal | ICD-10-CM | POA: Diagnosis present

## 2021-01-15 DIAGNOSIS — Z23 Encounter for immunization: Secondary | ICD-10-CM | POA: Diagnosis not present

## 2021-01-15 DIAGNOSIS — Z955 Presence of coronary angioplasty implant and graft: Secondary | ICD-10-CM

## 2021-01-15 DIAGNOSIS — I4510 Unspecified right bundle-branch block: Secondary | ICD-10-CM | POA: Diagnosis present

## 2021-01-15 DIAGNOSIS — R350 Frequency of micturition: Secondary | ICD-10-CM | POA: Diagnosis present

## 2021-01-15 DIAGNOSIS — I10 Essential (primary) hypertension: Secondary | ICD-10-CM | POA: Diagnosis not present

## 2021-01-15 DIAGNOSIS — I5041 Acute combined systolic (congestive) and diastolic (congestive) heart failure: Secondary | ICD-10-CM | POA: Diagnosis present

## 2021-01-15 DIAGNOSIS — Z85830 Personal history of malignant neoplasm of bone: Secondary | ICD-10-CM | POA: Diagnosis not present

## 2021-01-15 DIAGNOSIS — E119 Type 2 diabetes mellitus without complications: Secondary | ICD-10-CM

## 2021-01-15 DIAGNOSIS — M19042 Primary osteoarthritis, left hand: Secondary | ICD-10-CM | POA: Diagnosis present

## 2021-01-15 DIAGNOSIS — M19011 Primary osteoarthritis, right shoulder: Secondary | ICD-10-CM | POA: Diagnosis present

## 2021-01-15 DIAGNOSIS — E1140 Type 2 diabetes mellitus with diabetic neuropathy, unspecified: Secondary | ICD-10-CM | POA: Diagnosis present

## 2021-01-15 DIAGNOSIS — M170 Bilateral primary osteoarthritis of knee: Secondary | ICD-10-CM | POA: Diagnosis present

## 2021-01-15 DIAGNOSIS — E782 Mixed hyperlipidemia: Secondary | ICD-10-CM

## 2021-01-15 DIAGNOSIS — Z7982 Long term (current) use of aspirin: Secondary | ICD-10-CM

## 2021-01-15 DIAGNOSIS — Z8249 Family history of ischemic heart disease and other diseases of the circulatory system: Secondary | ICD-10-CM

## 2021-01-15 HISTORY — DX: Non-ST elevation (NSTEMI) myocardial infarction: I21.4

## 2021-01-15 HISTORY — PX: LEFT HEART CATH AND CORONARY ANGIOGRAPHY: CATH118249

## 2021-01-15 HISTORY — PX: CORONARY STENT INTERVENTION: CATH118234

## 2021-01-15 HISTORY — PX: CORONARY ULTRASOUND/IVUS: CATH118244

## 2021-01-15 LAB — CBC
HCT: 41.3 % (ref 39.0–52.0)
Hemoglobin: 14.7 g/dL (ref 13.0–17.0)
MCH: 31.6 pg (ref 26.0–34.0)
MCHC: 35.6 g/dL (ref 30.0–36.0)
MCV: 88.8 fL (ref 80.0–100.0)
Platelets: 357 10*3/uL (ref 150–400)
RBC: 4.65 MIL/uL (ref 4.22–5.81)
RDW: 12.2 % (ref 11.5–15.5)
WBC: 13 10*3/uL — ABNORMAL HIGH (ref 4.0–10.5)
nRBC: 0 % (ref 0.0–0.2)

## 2021-01-15 LAB — PROTIME-INR
INR: 1.1 (ref 0.8–1.2)
Prothrombin Time: 13.4 seconds (ref 11.4–15.2)

## 2021-01-15 LAB — CBC WITH DIFFERENTIAL/PLATELET
Abs Immature Granulocytes: 0.13 10*3/uL — ABNORMAL HIGH (ref 0.00–0.07)
Basophils Absolute: 0.1 10*3/uL (ref 0.0–0.1)
Basophils Relative: 1 %
Eosinophils Absolute: 0.2 10*3/uL (ref 0.0–0.5)
Eosinophils Relative: 2 %
HCT: 40.6 % (ref 39.0–52.0)
Hemoglobin: 13.9 g/dL (ref 13.0–17.0)
Immature Granulocytes: 1 %
Lymphocytes Relative: 21 %
Lymphs Abs: 2.5 10*3/uL (ref 0.7–4.0)
MCH: 30.4 pg (ref 26.0–34.0)
MCHC: 34.2 g/dL (ref 30.0–36.0)
MCV: 88.8 fL (ref 80.0–100.0)
Monocytes Absolute: 1.4 10*3/uL — ABNORMAL HIGH (ref 0.1–1.0)
Monocytes Relative: 12 %
Neutro Abs: 7.5 10*3/uL (ref 1.7–7.7)
Neutrophils Relative %: 63 %
Platelets: 344 10*3/uL (ref 150–400)
RBC: 4.57 MIL/uL (ref 4.22–5.81)
RDW: 12.3 % (ref 11.5–15.5)
WBC: 11.8 10*3/uL — ABNORMAL HIGH (ref 4.0–10.5)
nRBC: 0 % (ref 0.0–0.2)

## 2021-01-15 LAB — TROPONIN I (HIGH SENSITIVITY)
Troponin I (High Sensitivity): 22 ng/L — ABNORMAL HIGH (ref <18)
Troponin I (High Sensitivity): 74 ng/L — ABNORMAL HIGH (ref <18)
Troponin I (High Sensitivity): 2824 ng/L (ref <18)
Troponin I (High Sensitivity): 11019 ng/L (ref <18)

## 2021-01-15 LAB — HEPATIC FUNCTION PANEL
ALT: 27 U/L (ref 0–44)
AST: 34 U/L (ref 15–41)
Albumin: 2.8 g/dL — ABNORMAL LOW (ref 3.5–5.0)
Alkaline Phosphatase: 74 U/L (ref 38–126)
Bilirubin, Direct: 0.1 mg/dL (ref 0.0–0.2)
Indirect Bilirubin: 0.6 mg/dL (ref 0.3–0.9)
Total Bilirubin: 0.7 mg/dL (ref 0.3–1.2)
Total Protein: 7.6 g/dL (ref 6.5–8.1)

## 2021-01-15 LAB — ECHOCARDIOGRAM COMPLETE
Area-P 1/2: 3.72 cm2
Height: 71 in
S' Lateral: 4.5 cm
Weight: 4938.3 oz

## 2021-01-15 LAB — GLUCOSE, CAPILLARY
Glucose-Capillary: 126 mg/dL — ABNORMAL HIGH (ref 70–99)
Glucose-Capillary: 264 mg/dL — ABNORMAL HIGH (ref 70–99)

## 2021-01-15 LAB — HIV ANTIBODY (ROUTINE TESTING W REFLEX): HIV Screen 4th Generation wRfx: NONREACTIVE

## 2021-01-15 LAB — URINALYSIS, ROUTINE W REFLEX MICROSCOPIC
Bilirubin Urine: NEGATIVE
Glucose, UA: NEGATIVE mg/dL
Hgb urine dipstick: NEGATIVE
Ketones, ur: NEGATIVE mg/dL
Leukocytes,Ua: NEGATIVE
Nitrite: NEGATIVE
Protein, ur: NEGATIVE mg/dL
Specific Gravity, Urine: 1.01 (ref 1.005–1.030)
pH: 6 (ref 5.0–8.0)

## 2021-01-15 LAB — BASIC METABOLIC PANEL
Anion gap: 13 (ref 5–15)
BUN: 11 mg/dL (ref 8–23)
CO2: 21 mmol/L — ABNORMAL LOW (ref 22–32)
Calcium: 8.1 mg/dL — ABNORMAL LOW (ref 8.9–10.3)
Chloride: 100 mmol/L (ref 98–111)
Creatinine, Ser: 0.89 mg/dL (ref 0.61–1.24)
GFR, Estimated: >60 mL/min (ref >60)
Glucose, Bld: 200 mg/dL — ABNORMAL HIGH (ref 70–99)
Potassium: 3.7 mmol/L (ref 3.5–5.1)
Sodium: 134 mmol/L — ABNORMAL LOW (ref 135–145)

## 2021-01-15 LAB — RESP PANEL BY RT-PCR (FLU A&B, COVID) ARPGX2
Influenza A by PCR: NEGATIVE
Influenza B by PCR: NEGATIVE
SARS Coronavirus 2 by RT PCR: NEGATIVE

## 2021-01-15 LAB — LIPID PANEL
Cholesterol: 205 mg/dL — ABNORMAL HIGH (ref 0–200)
HDL: 27 mg/dL — ABNORMAL LOW (ref >40)
LDL Cholesterol: 158 mg/dL — ABNORMAL HIGH (ref 0–99)
Total CHOL/HDL Ratio: 7.6 RATIO
Triglycerides: 101 mg/dL (ref <150)
VLDL: 20 mg/dL (ref 0–40)

## 2021-01-15 LAB — SEDIMENTATION RATE: Sed Rate: 90 mm/hr — ABNORMAL HIGH (ref 0–16)

## 2021-01-15 LAB — POCT ACTIVATED CLOTTING TIME
Activated Clotting Time: 488 seconds
Activated Clotting Time: 547 seconds

## 2021-01-15 LAB — HEPARIN LEVEL (UNFRACTIONATED): Heparin Unfractionated: 0.42 IU/mL (ref 0.30–0.70)

## 2021-01-15 LAB — D-DIMER, QUANTITATIVE: D-Dimer, Quant: 1.31 ug/mL-FEU — ABNORMAL HIGH (ref 0.00–0.50)

## 2021-01-15 LAB — HEMOGLOBIN A1C
Hgb A1c MFr Bld: 6.8 % — ABNORMAL HIGH (ref 4.8–5.6)
Mean Plasma Glucose: 148.46 mg/dL

## 2021-01-15 LAB — C-REACTIVE PROTEIN: CRP: 10.7 mg/dL — ABNORMAL HIGH (ref <1.0)

## 2021-01-15 LAB — MAGNESIUM: Magnesium: 2 mg/dL (ref 1.7–2.4)

## 2021-01-15 LAB — TSH: TSH: 0.93 u[IU]/mL (ref 0.350–4.500)

## 2021-01-15 SURGERY — LEFT HEART CATH AND CORONARY ANGIOGRAPHY
Anesthesia: LOCAL

## 2021-01-15 MED ORDER — SODIUM CHLORIDE 0.9 % WEIGHT BASED INFUSION: 3.0000 mL/kg/h | INTRAVENOUS | Status: AC

## 2021-01-15 MED ORDER — IOHEXOL 350 MG/ML SOLN
82.0000 mL | Freq: Once | INTRAVENOUS | Status: AC | PRN
  Administered 2021-01-15: 82 mL via INTRAVENOUS

## 2021-01-15 MED ORDER — ACETAMINOPHEN 650 MG RE SUPP: 650.0000 mg | Freq: Four times a day (QID) | RECTAL | Status: DC | PRN

## 2021-01-15 MED ORDER — ASPIRIN 81 MG PO CHEW
324.0000 mg | CHEWABLE_TABLET | Freq: Once | ORAL | Status: AC
  Administered 2021-01-15: 324 mg via ORAL
  Filled 2021-01-15: qty 4

## 2021-01-15 MED ORDER — HEPARIN (PORCINE) 25000 UT/250ML-% IV SOLN
1450.0000 [IU]/h | INTRAVENOUS | Status: DC
  Administered 2021-01-15: 1450 [IU]/h via INTRAVENOUS
  Filled 2021-01-15 (×2): qty 250

## 2021-01-15 MED ORDER — LIDOCAINE HCL (PF) 1 % IJ SOLN
INTRAMUSCULAR | Status: DC | PRN
  Administered 2021-01-15: 3 mL via INTRADERMAL

## 2021-01-15 MED ORDER — SODIUM CHLORIDE 0.9% FLUSH
3.0000 mL | Freq: Two times a day (BID) | INTRAVENOUS | Status: DC
  Administered 2021-01-16: 3 mL via INTRAVENOUS

## 2021-01-15 MED ORDER — ACETAMINOPHEN 325 MG PO TABS
650.0000 mg | ORAL_TABLET | Freq: Four times a day (QID) | ORAL | Status: DC | PRN
  Administered 2021-01-15: 650 mg via ORAL
  Filled 2021-01-15: qty 2

## 2021-01-15 MED ORDER — TICAGRELOR 90 MG PO TABS
ORAL_TABLET | ORAL | Status: AC
  Filled 2021-01-15: qty 1

## 2021-01-15 MED ORDER — HEPARIN BOLUS VIA INFUSION
4000.0000 [IU] | Freq: Once | INTRAVENOUS | Status: AC
  Administered 2021-01-15: 4000 [IU] via INTRAVENOUS
  Filled 2021-01-15: qty 4000

## 2021-01-15 MED ORDER — SODIUM CHLORIDE 0.9 % IV SOLN: INTRAVENOUS | Status: AC

## 2021-01-15 MED ORDER — HEPARIN (PORCINE) IN NACL 1000-0.9 UT/500ML-% IV SOLN
INTRAVENOUS | Status: DC | PRN
  Administered 2021-01-15 (×2): 500 mL

## 2021-01-15 MED ORDER — SODIUM CHLORIDE 0.9 % IV SOLN: 250.0000 mL | INTRAVENOUS | Status: DC | PRN

## 2021-01-15 MED ORDER — LISINOPRIL 40 MG PO TABS
40.0000 mg | ORAL_TABLET | Freq: Every day | ORAL | Status: DC
  Administered 2021-01-15 – 2021-01-16 (×2): 40 mg via ORAL
  Filled 2021-01-15: qty 1
  Filled 2021-01-15: qty 2

## 2021-01-15 MED ORDER — LABETALOL HCL 5 MG/ML IV SOLN: 5.0000 mg | INTRAVENOUS | Status: DC | PRN

## 2021-01-15 MED ORDER — MIDAZOLAM HCL 2 MG/2ML IJ SOLN
INTRAMUSCULAR | Status: AC
  Filled 2021-01-15: qty 2

## 2021-01-15 MED ORDER — HYDRALAZINE HCL 20 MG/ML IJ SOLN: 10.0000 mg | INTRAMUSCULAR | Status: AC | PRN

## 2021-01-15 MED ORDER — ATORVASTATIN CALCIUM 80 MG PO TABS
80.0000 mg | ORAL_TABLET | Freq: Every day | ORAL | Status: DC
  Administered 2021-01-16: 80 mg via ORAL
  Filled 2021-01-15: qty 1

## 2021-01-15 MED ORDER — METOPROLOL TARTRATE 12.5 MG HALF TABLET
12.5000 mg | ORAL_TABLET | Freq: Two times a day (BID) | ORAL | Status: DC
  Administered 2021-01-15 – 2021-01-16 (×3): 12.5 mg via ORAL
  Filled 2021-01-15 (×3): qty 1

## 2021-01-15 MED ORDER — POTASSIUM CHLORIDE CRYS ER 20 MEQ PO TBCR
40.0000 meq | EXTENDED_RELEASE_TABLET | Freq: Once | ORAL | Status: AC
  Administered 2021-01-15: 40 meq via ORAL
  Filled 2021-01-15: qty 2

## 2021-01-15 MED ORDER — SENNOSIDES-DOCUSATE SODIUM 8.6-50 MG PO TABS: 1.0000 | ORAL_TABLET | Freq: Every evening | ORAL | Status: DC | PRN

## 2021-01-15 MED ORDER — TIROFIBAN HCL IN NACL 5-0.9 MG/100ML-% IV SOLN: INTRAVENOUS | Status: DC | PRN

## 2021-01-15 MED ORDER — IOHEXOL 350 MG/ML SOLN
INTRAVENOUS | Status: AC
  Filled 2021-01-15: qty 1

## 2021-01-15 MED ORDER — ASPIRIN 81 MG PO CHEW: 81.0000 mg | CHEWABLE_TABLET | Freq: Every day | ORAL | Status: DC

## 2021-01-15 MED ORDER — ASPIRIN 81 MG PO CHEW: 81.0000 mg | CHEWABLE_TABLET | ORAL | Status: DC

## 2021-01-15 MED ORDER — MIDAZOLAM HCL 2 MG/2ML IJ SOLN
INTRAMUSCULAR | Status: DC | PRN
  Administered 2021-01-15: 2 mg via INTRAVENOUS
  Administered 2021-01-15: 1 mg via INTRAVENOUS

## 2021-01-15 MED ORDER — VERAPAMIL HCL 2.5 MG/ML IV SOLN
INTRAVENOUS | Status: DC | PRN
  Administered 2021-01-15: 10 mL via INTRA_ARTERIAL

## 2021-01-15 MED ORDER — VERAPAMIL HCL 2.5 MG/ML IV SOLN
INTRAVENOUS | Status: AC
  Filled 2021-01-15: qty 2

## 2021-01-15 MED ORDER — ASPIRIN 325 MG PO TABS: 325.0000 mg | ORAL_TABLET | Freq: Once | ORAL | Status: DC

## 2021-01-15 MED ORDER — AMLODIPINE BESYLATE 10 MG PO TABS
10.0000 mg | ORAL_TABLET | Freq: Every day | ORAL | Status: DC
Start: 2021-01-16 — End: 2021-01-16
  Administered 2021-01-16: 10 mg via ORAL
  Filled 2021-01-15: qty 1

## 2021-01-15 MED ORDER — INSULIN ASPART 100 UNIT/ML ~~LOC~~ SOLN
0.0000 [IU] | Freq: Three times a day (TID) | SUBCUTANEOUS | Status: DC
  Administered 2021-01-16 (×2): 2 [IU] via SUBCUTANEOUS

## 2021-01-15 MED ORDER — LABETALOL HCL 5 MG/ML IV SOLN: 10.0000 mg | INTRAVENOUS | Status: AC | PRN

## 2021-01-15 MED ORDER — HEPARIN SODIUM (PORCINE) 1000 UNIT/ML IJ SOLN
INTRAMUSCULAR | Status: AC
  Filled 2021-01-15: qty 1

## 2021-01-15 MED ORDER — HEPARIN (PORCINE) IN NACL 2000-0.9 UNIT/L-% IV SOLN
INTRAVENOUS | Status: AC
  Filled 2021-01-15: qty 1000

## 2021-01-15 MED ORDER — FENTANYL CITRATE (PF) 100 MCG/2ML IJ SOLN
INTRAMUSCULAR | Status: DC | PRN
  Administered 2021-01-15: 25 ug via INTRAVENOUS
  Administered 2021-01-15: 50 ug via INTRAVENOUS

## 2021-01-15 MED ORDER — TIROFIBAN (AGGRASTAT) BOLUS VIA INFUSION
INTRAVENOUS | Status: DC | PRN
  Administered 2021-01-15: 3500 ug via INTRAVENOUS

## 2021-01-15 MED ORDER — ASPIRIN 81 MG PO CHEW
81.0000 mg | CHEWABLE_TABLET | Freq: Every day | ORAL | Status: DC
  Administered 2021-01-16: 81 mg via ORAL
  Filled 2021-01-15: qty 1

## 2021-01-15 MED ORDER — IOHEXOL 350 MG/ML SOLN
INTRAVENOUS | Status: DC | PRN
  Administered 2021-01-15: 120 mL via INTRA_ARTERIAL

## 2021-01-15 MED ORDER — TIROFIBAN HCL IN NACL 5-0.9 MG/100ML-% IV SOLN
INTRAVENOUS | Status: AC
  Filled 2021-01-15: qty 100

## 2021-01-15 MED ORDER — INSULIN ASPART 100 UNIT/ML ~~LOC~~ SOLN
0.0000 [IU] | Freq: Every day | SUBCUTANEOUS | Status: DC
  Administered 2021-01-15: 3 [IU] via SUBCUTANEOUS

## 2021-01-15 MED ORDER — NITROGLYCERIN 0.4 MG SL SUBL
0.4000 mg | SUBLINGUAL_TABLET | SUBLINGUAL | Status: DC | PRN
  Administered 2021-01-15: 0.4 mg via SUBLINGUAL
  Filled 2021-01-15: qty 1

## 2021-01-15 MED ORDER — TICAGRELOR 90 MG PO TABS
90.0000 mg | ORAL_TABLET | Freq: Two times a day (BID) | ORAL | Status: DC
  Administered 2021-01-16 (×2): 90 mg via ORAL
  Filled 2021-01-15 (×2): qty 1

## 2021-01-15 MED ORDER — HEPARIN SODIUM (PORCINE) 1000 UNIT/ML IJ SOLN
INTRAMUSCULAR | Status: DC | PRN
  Administered 2021-01-15: 6000 [IU] via INTRAVENOUS
  Administered 2021-01-15: 9000 [IU] via INTRAVENOUS

## 2021-01-15 MED ORDER — ONDANSETRON HCL 4 MG/2ML IJ SOLN: 4.0000 mg | Freq: Four times a day (QID) | INTRAMUSCULAR | Status: DC | PRN

## 2021-01-15 MED ORDER — ASPIRIN EC 81 MG PO TBEC: 81.0000 mg | DELAYED_RELEASE_TABLET | Freq: Every day | ORAL | Status: DC

## 2021-01-15 MED ORDER — TICAGRELOR 90 MG PO TABS
ORAL_TABLET | ORAL | Status: DC | PRN
  Administered 2021-01-15: 180 mg via ORAL

## 2021-01-15 MED ORDER — HEPARIN SODIUM (PORCINE) 5000 UNIT/ML IJ SOLN
5000.0000 [IU] | Freq: Three times a day (TID) | INTRAMUSCULAR | Status: DC
  Administered 2021-01-16: 5000 [IU] via SUBCUTANEOUS
  Filled 2021-01-15: qty 1

## 2021-01-15 MED ORDER — HEPARIN (PORCINE) IN NACL 1000-0.9 UT/500ML-% IV SOLN
INTRAVENOUS | Status: AC
  Filled 2021-01-15: qty 1500

## 2021-01-15 MED ORDER — CLOPIDOGREL BISULFATE 300 MG PO TABS
300.0000 mg | ORAL_TABLET | Freq: Once | ORAL | Status: AC
  Administered 2021-01-15: 300 mg via ORAL
  Filled 2021-01-15: qty 1

## 2021-01-15 MED ORDER — FENTANYL CITRATE (PF) 100 MCG/2ML IJ SOLN
INTRAMUSCULAR | Status: AC
  Filled 2021-01-15: qty 2

## 2021-01-15 MED ORDER — MORPHINE SULFATE (PF) 4 MG/ML IV SOLN
4.0000 mg | Freq: Once | INTRAVENOUS | Status: AC
Start: 2021-01-15 — End: 2021-01-15
  Administered 2021-01-15: 4 mg via INTRAVENOUS
  Filled 2021-01-15: qty 1

## 2021-01-15 MED ORDER — SODIUM CHLORIDE 0.9% FLUSH: 3.0000 mL | INTRAVENOUS | Status: DC | PRN

## 2021-01-15 MED ORDER — SODIUM CHLORIDE 0.9 % WEIGHT BASED INFUSION: 1.0000 mL/kg/h | INTRAVENOUS | Status: DC

## 2021-01-15 MED ORDER — SODIUM CHLORIDE 0.9% FLUSH
3.0000 mL | Freq: Two times a day (BID) | INTRAVENOUS | Status: DC
  Administered 2021-01-15 – 2021-01-16 (×3): 3 mL via INTRAVENOUS

## 2021-01-15 MED ORDER — ACETAMINOPHEN 325 MG PO TABS: 650.0000 mg | ORAL_TABLET | ORAL | Status: DC | PRN

## 2021-01-15 MED ORDER — HYDROCODONE-ACETAMINOPHEN 5-325 MG PO TABS: 2.0000 | ORAL_TABLET | Freq: Once | ORAL | Status: DC

## 2021-01-15 MED ORDER — HYDROCODONE-ACETAMINOPHEN 5-325 MG PO TABS
2.0000 | ORAL_TABLET | ORAL | 0 refills | Status: DC | PRN
Start: 2021-01-15 — End: 2023-04-17

## 2021-01-15 MED ORDER — LIDOCAINE HCL (PF) 1 % IJ SOLN
INTRAMUSCULAR | Status: AC
  Filled 2021-01-15: qty 30

## 2021-01-15 SURGICAL SUPPLY — 23 items
BAG SNAP BAND KOVER 36X36 (MISCELLANEOUS) ×2 IMPLANT
BALLN SAPPHIRE 2.5X15 (BALLOONS) ×2
BALLN SAPPHIRE ~~LOC~~ 3.75X15 (BALLOONS) ×2 IMPLANT
BALLOON SAPPHIRE 2.5X15 (BALLOONS) ×1 IMPLANT
CATH 5FR JL3.5 JR4 ANG PIG MP (CATHETERS) ×2 IMPLANT
CATH LAUNCHER 6FR EBU 3.75 (CATHETERS) ×2 IMPLANT
CATH OPTICROSS HD (CATHETERS) ×2 IMPLANT
COVER DOME SNAP 22 D (MISCELLANEOUS) ×2 IMPLANT
DEVICE RAD COMP TR BAND LRG (VASCULAR PRODUCTS) ×2 IMPLANT
GLIDESHEATH SLEND SS 6F .021 (SHEATH) ×2 IMPLANT
GUIDEWIRE INQWIRE 1.5J.035X260 (WIRE) ×1 IMPLANT
INQWIRE 1.5J .035X260CM (WIRE) ×2
KIT ENCORE 26 ADVANTAGE (KITS) ×2 IMPLANT
KIT HEART LEFT (KITS) ×2 IMPLANT
KIT HEMO VALVE WATCHDOG (MISCELLANEOUS) ×2 IMPLANT
PACK CARDIAC CATHETERIZATION (CUSTOM PROCEDURE TRAY) ×2 IMPLANT
SHEATH PROBE COVER 6X72 (BAG) ×2 IMPLANT
SLED PULL BACK IVUS (MISCELLANEOUS) ×2 IMPLANT
STENT RESOLUTE ONYX 3.0X22 (Permanent Stent) ×2 IMPLANT
TRANSDUCER W/STOPCOCK (MISCELLANEOUS) ×2 IMPLANT
TUBING CIL FLEX 10 FLL-RA (TUBING) ×2 IMPLANT
WIRE ASAHI PROWATER 180CM (WIRE) ×2 IMPLANT
WIRE HI TORQ BMW 190CM (WIRE) ×2 IMPLANT

## 2021-01-15 NOTE — Progress Notes (Signed)
ANTICOAGULATION CONSULT NOTE  Pharmacy Consult for heparin Indication: chest pain/ACS  Heparin Dosing Weight: 107.9 kg  Labs: Recent Labs    01/15/21 0518 01/15/21 0704 01/15/21 1155 01/15/21 1548  HGB 14.7  --  13.9  --   HCT 41.3  --  40.6  --   PLT 357  --  344  --   LABPROT 13.4  --   --   --   INR 1.1  --   --   --   HEPARINUNFRC  --   --   --  0.42  CREATININE 0.89  --   --   --   TROPONINIHS 22* 74* 2,824*  --     Estimated Creatinine Clearance: 123.2 mL/min (by C-G formula based on SCr of 0.89 mg/dL).  Assessment: 25 yom presenting with CP, elevated high-sensitivity troponin. Pharmacy consulted to dose heparin.  CTA negative for PE, thoracic aortic aneurysm, or dissection.   Initial heparin level is therapeutic on heparin at 1450 units/hr; no bleeding reported.  Patient is now in the cath lab.  Goal of Therapy:  Heparin level 0.3-0.7 units/ml Monitor platelets by anticoagulation protocol: Yes   Plan:  F/u post cath  Sebastyan Snodgrass D. Mina Marble, PharmD, BCPS, [place] 01/15/2021, 4:34 PM

## 2021-01-15 NOTE — ED Notes (Signed)
meds not verified by pharmacy

## 2021-01-15 NOTE — Consult Note (Addendum)
Cardiology Consultation:   Jacob Gonzales ID: Trentin Knappenberger MRN: [id number]; DOB: [birth year]  Admit date: 01/15/2021 Date of Consult: 01/15/2021  PCP:  Philmore Pali, NP   [place]  Cardiologist:  Larae Grooms, MD  Advanced Practice Provider:  No care team member to display Electrophysiologist:  None   :[id number]}  Jacob Gonzales Profile:   Jacob Gonzales is a 65 y.o. male with a hx of DM2, HTN, CVA who is being seen today for the evaluation of chest pain/NSVT at the request of Dr. Philipp Ovens.  History of Present Illness:   Jacob Gonzales is a 65 yo male with PMH noted above. Jacob Gonzales was seen by Dr. Harrington Challenger back in 06/2019 for syncope. Work up was negative and echo showed normal EF.    Jacob Gonzales currently lives at home independently.  Has a farm and takes care of animals.  Does not have any anginal symptoms or shortness of breath with this type of activity.  Does report developing a cough several weeks ago which has been nonproductive.  Notes some lower extremity edema with trouble getting Jacob Gonzales socks and shoes on over the past couple of weeks as well.  Presented to the ER on 2/11 sudden onset of left-sided chest pressure and tightness with shortness of breath and nausea which woke him from sleep at 3 AM.  Symptoms persisted for about 30 minutes and Jacob Gonzales called Jacob Gonzales son to bring him to the ER to be evaluated.  In the ER Jacob Gonzales labs showed sodium 134, potassium 3.7, creatinine 0.89, high-sensitivity troponin 22>> 74>> 2824, WBC 13, D-dimer 1.3, hemoglobin A1c 6.8, TSH 0.93.  LDL 158, HDL 27 EKG showed sinus rhythm 97 bpm, nonspecific changes with incomplete right bundle branch block.  Underwent CT angiogram which showed scattered pulmonary nodules lesions throughout the lungs ranging in size from 2 mm to 7 mm and multiple enlarged hilar and mediastinal lymph nodes with largest measuring 2.3 x 2.1 cm.   Jacob Gonzales was started on IV heparin also given Plavix load of 300 mg.  Admitted to internal medicine for further  management.  Had notable 21 beat run of nonsustained VT on telemetry.  Echocardiogram showed EF of 40 to 45% global hypokinesis, grade 1 diastolic dysfunction, trivial pericardial effusion.  In talking with Jacob Gonzales Jacob Gonzales reports improvement in symptoms with receiving sublingual nitro while in the ER.   In talking with the Jacob Gonzales Jacob Gonzales does report strong family history of coronary disease in Jacob Gonzales mother, father and sister.  All having had MIs.  Past Medical History:  Diagnosis Date  . Acute arterial ischemic stroke, vertebrobasilar, thalamic ([place]) 11/17/2015  . Arthritis    Bilateral shoulders, Hands, Knees  . Bone cancer ([place]) 2016  . Brain cancer ([place]) 2016  . Diabetes ([place]) 11/17/2015  . HTN (hypertension) 11/17/2015  . NSTEMI (non-ST elevated myocardial infarction) [facility])     Past Surgical History:  Procedure Laterality Date  . NO PAST SURGERIES      Home Medications:  Prior to Admission medications   Medication Sig Start Date End Date Taking? Authorizing Provider  amLODipine (NORVASC) 10 MG tablet Take 10 mg by mouth daily. for high blood pressure 02/16/19   [provider]  aspirin 81 MG chewable tablet Chew 1 tablet (81 mg total) by mouth daily. 07/12/15   Vaughan Basta, MD  lisinopril (PRINIVIL,ZESTRIL) 10 MG tablet Take 1 tablet (10 mg total) by mouth daily. Jacob Gonzales taking differently: Take 40 mg by mouth daily.  07/12/15   Vaughan Basta, MD  Inpatient Medications: Scheduled Meds: . [START ON 01/16/2021] aspirin EC  81 mg Oral Daily  . insulin aspart  0-15 Units Subcutaneous TID WC  . insulin aspart  0-5 Units Subcutaneous QHS  . lisinopril  40 mg Oral Daily  . metoprolol tartrate  12.5 mg Oral BID  . sodium chloride flush  3 mL Intravenous Q12H   Continuous Infusions: . heparin 1,450 Units/hr (01/15/21 1211)   PRN Meds: acetaminophen **OR** acetaminophen, labetalol, nitroGLYCERIN, senna-docusate  Allergies:   No Known Allergies  Social History:    Social History   Socioeconomic History  . Marital status: Married    Spouse name: Not on file  . Number of children: 3  . Years of education: Not on file  . Highest education level: Not on file  Occupational History  . Not on file  Tobacco Use  . Smoking status: Never Smoker  . Smokeless tobacco: Never Used  Vaping Use  . Vaping Use: Never used  Substance and Sexual Activity  . Alcohol use: No    Alcohol/week: 0.0 standard drinks  . Drug use: No  . Sexual activity: Not on file  Other Topics Concern  . Not on file  Social History Narrative   Lives at home with wife   Caffeine use: Drinks soda (2-12packs per week)   Social Determinants of Health   Financial Resource Strain: Not on file  Food Insecurity: Not on file  Transportation Needs: Not on file  Physical Activity: Not on file  Stress: Not on file  Social Connections: Not on file  Intimate Partner Violence: Not on file    Family History:    Family History  Problem Relation Age of Onset  . Heart attack Mother   . Heart attack Father   . Diabetes Mellitus II Father   . Kidney failure Father   . Uterine cancer Sister   . Heart attack Brother   . Heart attack Maternal Aunt   . Heart attack Maternal Uncle   . Heart attack Paternal Aunt   . Heart attack Paternal Uncle   . Stroke Neg Hx      ROS:  Please see the history of present illness.   All other ROS reviewed and negative.     Physical Exam/Data:   Vitals:   01/15/21 1345 01/15/21 1400 01/15/21 1415 01/15/21 1430  BP: (!) 156/75 (!) 149/86 (!) 154/81   Pulse: 72 71 75 77  Resp: 20 18 (!) 23 20  Temp:      TempSrc:      SpO2: 94% 92% 95% 95%  Weight:      Height:       No intake or output data in the 24 hours ending 01/15/21 1513 Last 3 Weights 01/15/2021 06/06/2019 06/03/2019  Weight (lbs) 308 lb 10.3 oz 271 lb 260 lb  Weight (kg) 140 kg 122.925 kg 117.935 kg     Body mass index is 43.05 kg/m.  General:  Well nourished, well developed, in no  acute distress HEENT: normal Lymph: no adenopathy Neck: no JVD Endocrine:  No thryomegaly Vascular: No carotid bruits; FA pulses 2+ bilaterally without bruits  Cardiac:  normal S1, S2; RRR; no murmur  Lungs:  clear to auscultation bilaterally, no wheezing, rhonchi or rales  Abd: soft, nontender, no hepatomegaly  Ext: no edema Musculoskeletal:  No deformities, BUE and BLE strength normal and equal Skin: warm and dry  Neuro:  CNs 2-12 intact, no focal abnormalities noted Psych:  Normal affect  EKG:  The EKG was personally reviewed and demonstrates: Sinus rhythm, 97 bpm nonspecific changes, incomplete right bundle branch block Telemetry:  Telemetry was personally reviewed and demonstrates: Sinus rhythm, 21 beat run of nonsustained VT  Relevant CV Studies:  Echo: 01/15/21  IMPRESSIONS    1. Left ventricular ejection fraction, by estimation, is 40 to 45%. The  left ventricle has mildly decreased function. The left ventricle  demonstrates global hypokinesis. There is moderate left ventricular  hypertrophy. Left ventricular diastolic  parameters are consistent with Grade I diastolic dysfunction (impaired  relaxation). Elevated left ventricular end-diastolic pressure.  2. Right ventricular systolic function is normal. The right ventricular  size is normal.  3. Left atrial size was mildly dilated.  4. The trivial pericardial effusion is posterior to the left ventricle.  5. The mitral valve is grossly normal. Trivial mitral valve  regurgitation.  6. The aortic valve is tricuspid. Aortic valve regurgitation is not  visualized.  7. The inferior vena cava is normal in size with greater than 50%  respiratory variability, suggesting right atrial pressure of 3 mmHg.   Comparison(s): Changes from prior study are noted. 02/05/2019: LVEF 55-60%.   Laboratory Data:  High Sensitivity Troponin:   Recent Labs  Lab 01/15/21 0518 01/15/21 0704 01/15/21 1155  TROPONINIHS 22* 74* 2,824*      Chemistry Recent Labs  Lab 01/15/21 0518  NA 134*  K 3.7  CL 100  CO2 21*  GLUCOSE 200*  BUN 11  CREATININE 0.89  CALCIUM 8.1*  GFRNONAA >60  ANIONGAP 13    Recent Labs  Lab 01/15/21 1155  PROT 7.6  ALBUMIN 2.8*  AST 34  ALT 27  ALKPHOS 74  BILITOT 0.7   Hematology Recent Labs  Lab 01/15/21 0518 01/15/21 1155  WBC 13.0* 11.8*  RBC 4.65 4.57  HGB 14.7 13.9  HCT 41.3 40.6  MCV 88.8 88.8  MCH 31.6 30.4  MCHC 35.6 34.2  RDW 12.2 12.3  PLT 357 344   BNPNo results for input(s): BNP, PROBNP in the last 168 hours.  DDimer  Recent Labs  Lab 01/15/21 0704  DDIMER 1.31*     Radiology/Studies:  DG Chest 2 View  Result Date: 01/15/2021 CLINICAL DATA:  65 year old male with chest pain. EXAM: CHEST - 2 VIEW COMPARISON:  Chest radiographs 03/03/2019. FINDINGS: Lung volumes and mediastinal contours are stable and within normal limits. Mild eventration of the diaphragm (normal variant). Visualized tracheal air column is within normal limits. No pneumothorax. No pulmonary edema, pleural effusion or confluent pulmonary opacity. No acute osseous abnormality identified. Negative visible bowel gas pattern. IMPRESSION: Negative.  No acute cardiopulmonary abnormality. Electronically Signed   By: Genevie Ann M.D.   On: 01/15/2021 05:50   CT Angio Chest PE W and/or Wo Contrast  Result Date: 01/15/2021 CLINICAL DATA:  Shortness of breath with positive D-dimer study EXAM: CT ANGIOGRAPHY CHEST WITH CONTRAST TECHNIQUE: Multidetector CT imaging of the chest was performed using the standard protocol during bolus administration of intravenous contrast. Multiplanar CT image reconstructions and MIPs were obtained to evaluate the vascular anatomy. CONTRAST:  35mL OMNIPAQUE IOHEXOL 350 MG/ML SOLN COMPARISON:  January 15, 2021 FINDINGS: Cardiovascular: There is no demonstrable pulmonary embolus. There is no thoracic aortic aneurysm or dissection. Visualized great vessels appear unremarkable.  There are foci of aortic atherosclerosis as well as multiple foci of coronary artery calcification. There is a minimal pericardial effusion with pericardial fluid likely at upper physiologic range. No pericardial thickening. Mediastinum/Nodes: Visualized thyroid appears unremarkable.  There are multiple enlarged hilar and mediastinal lymph nodes. There is a lymph node immediately to the left of the upper carina measuring 1.8 x 1.6 cm. A lymph node to the right of the distal most aspect of the trachea measures 1.3 x 1.2 cm. A nearby lymph node slightly more superiorly to the right of the carina measures 1.5 x 1.1 cm. There are left hilar lymph nodes, largest measuring 1.7 x 1.7 cm. A lymph node slightly inferior to the left hilum measures 2.2 x 1.4 cm. There are several subcarinal lymph nodes with the largest subcarinal lymph node measuring 2.3 x 2.1 cm. There are multiple right hilar lymph nodes with the largest individual lymph node in the infrahilar region on the right measuring 2.0 x 2.0 cm. No esophageal lesions are evident. Lungs/Pleura: There are scattered pulmonary nodular lesions throughout the lungs bilaterally ranging in size from as small as 2 mm to as large as 7 mm. Largest nodular lesion is in the lateral segment right lower lobe seen on axial slice 52 series 6. There are areas of somewhat mosaic attenuation in the lungs bilaterally, most notably in the right upper lobe. There is no well-defined airspace consolidation. No appreciable pleural effusions. No pneumothorax. Trachea and major bronchial structures appear patent. Upper Abdomen: Visualized upper abdominal structures appear unremarkable. Musculoskeletal: There are foci of degenerative change in the thoracic spine. There are no blastic or lytic bone lesions. No chest wall lesions are evident. Review of the MIP images confirms the above findings. IMPRESSION: 1. No demonstrable pulmonary embolus. No thoracic aortic aneurysm or dissection. There are  foci of aortic atherosclerosis as well as coronary artery calcification. 2. Extensive adenopathy at multiple sites as noted. Neoplastic etiology suspected given this degree of adenopathy. 3. Scattered subcentimeter pulmonary nodular opacities which potentially could represent small metastases given the associated adenopathy. No larger pulmonary nodular lesions evident. 4. Areas of mosaic attenuation may indicate a combination of atelectasis and underlying small airways obstructive disease. No well-defined consolidation. Aortic Atherosclerosis (ICD10-I70.0). Electronically Signed   By: Lowella Grip III M.D.   On: 01/15/2021 08:47   ECHOCARDIOGRAM COMPLETE  Result Date: 01/15/2021    ECHOCARDIOGRAM REPORT   Jacob Gonzales Name:   Jacob Gonzales Date of Exam: 01/15/2021 Medical Rec #:  [id number]   Height:       71.0 in Accession #:    [id number]  Weight:       308.6 lb Date of Birth:  [birth year]  BSA:          2.536 m Jacob Gonzales Age:    71 years    BP:           185/110 mmHg Jacob Gonzales Gender: M           HR:           74 bpm. Exam Location:  Inpatient Procedure: 2D Echo, Cardiac Doppler and Color Doppler Indications:    Pericardial effusion, chest pain  History:        Jacob Gonzales has prior history of Echocardiogram examinations, most                 recent 02/05/2019. Signs/Symptoms:Shortness of Breath and Chest                 Pain. Elevated troponin.  Sonographer:    Dustin Flock Referring Phys: [place]  1. Left ventricular ejection fraction, by estimation, is 40 to 45%. The left ventricle has mildly decreased function. The left ventricle demonstrates  global hypokinesis. There is moderate left ventricular hypertrophy. Left ventricular diastolic parameters are consistent with Grade I diastolic dysfunction (impaired relaxation). Elevated left ventricular end-diastolic pressure.  2. Right ventricular systolic function is normal. The right ventricular size is normal.  3. Left atrial size was mildly  dilated.  4. The trivial pericardial effusion is posterior to the left ventricle.  5. The mitral valve is grossly normal. Trivial mitral valve regurgitation.  6. The aortic valve is tricuspid. Aortic valve regurgitation is not visualized.  7. The inferior vena cava is normal in size with greater than 50% respiratory variability, suggesting right atrial pressure of 3 mmHg. Comparison(s): Changes from prior study are noted. 02/05/2019: LVEF 55-60%. FINDINGS  Left Ventricle: Left ventricular ejection fraction, by estimation, is 40 to 45%. The left ventricle has mildly decreased function. The left ventricle demonstrates global hypokinesis. The left ventricular internal cavity size was normal in size. There is  moderate left ventricular hypertrophy. Left ventricular diastolic parameters are consistent with Grade I diastolic dysfunction (impaired relaxation). Elevated left ventricular end-diastolic pressure. Right Ventricle: The right ventricular size is normal. No increase in right ventricular wall thickness. Right ventricular systolic function is normal. Left Atrium: Left atrial size was mildly dilated. Right Atrium: Right atrial size was normal in size. Pericardium: Trivial pericardial effusion is present. The pericardial effusion is posterior to the left ventricle. Mitral Valve: The mitral valve is grossly normal. Trivial mitral valve regurgitation. Tricuspid Valve: The tricuspid valve is grossly normal. Tricuspid valve regurgitation is trivial. Aortic Valve: The aortic valve is tricuspid. Aortic valve regurgitation is not visualized. Pulmonic Valve: The pulmonic valve was not well visualized. Pulmonic valve regurgitation is not visualized. Aorta: The aortic root and ascending aorta are structurally normal, with no evidence of dilitation. Venous: The inferior vena cava is normal in size with greater than 50% respiratory variability, suggesting right atrial pressure of 3 mmHg. IAS/Shunts: No atrial level shunt detected  by color flow Doppler.  LEFT VENTRICLE PLAX 2D LVIDd:         5.70 cm  Diastology LVIDs:         4.50 cm  LV e' medial:    4.35 cm/s LV PW:         1.40 cm  LV E/e' medial:  20.7 LV IVS:        1.40 cm  LV e' lateral:   7.18 cm/s LVOT diam:     2.30 cm  LV E/e' lateral: 12.5 LV SV:         67 LV SV Index:   26 LVOT Area:     4.15 cm  RIGHT VENTRICLE RV Basal diam:  2.80 cm RV S prime:     11.60 cm/s TAPSE (M-mode): 3.2 cm LEFT ATRIUM             Index       RIGHT ATRIUM           Index LA diam:        4.50 cm 1.77 cm/m  RA Area:     14.40 cm LA Vol (A2C):   90.9 ml 35.85 ml/m RA Volume:   38.30 ml  15.11 ml/m LA Vol (A4C):   81.0 ml 31.95 ml/m LA Biplane Vol: 87.5 ml 34.51 ml/m  AORTIC VALVE LVOT Vmax:   82.90 cm/s LVOT Vmean:  55.400 cm/s LVOT VTI:    0.161 m  AORTA Ao Root diam: 3.30 cm MITRAL VALVE MV Area (PHT): 3.72 cm    SHUNTS MV  Decel Time: 204 msec    Systemic VTI:  0.16 m MV E velocity: 90.00 cm/s  Systemic Diam: 2.30 cm MV A velocity: 73.70 cm/s MV E/A ratio:  1.22 Lyman Bishop MD Electronically signed by Lyman Bishop MD Signature Date/Time: 01/15/2021/12:58:16 PM    Final      Assessment and Plan:   Jacob Gonzales is a 65 y.o. male with a hx of DM2, HTN, CVA who is being seen today for the evaluation of chest pain/NSVT at the request of Dr. Philipp Ovens.  1.  Non-STEMI: High-sensitivity troponin rose to 2824, EKG with nonspecific changes.  Does have concerning risk factors with strong family history of CAD as well as hypertension, hyperlipidemia and diabetes.  --Currently on IV heparin and has been given a loading dose of Plavix 300 mg --On aspirin 81 mg, metoprolol 12.5 mg twice daily --Discussed with Jacob Gonzales concern for ongoing ACS and need for cardiac cath.  Jacob Gonzales is agreeable to proceed. The Jacob Gonzales understands that risks included but are not limited to stroke (1 in 1000), death (1 in 83), kidney failure [usually temporary] (1 in 500), bleeding (1 in 200), allergic reaction [possibly  serious] (1 in 200).   2.  Pulmonary nodules: Positive D-dimer, CT angio showed multiple enlarged hilar and mediastinal lymph nodes with largest measuring 2.3 x 2.1 cm with multiple pulmonary nodules ranging from 2 mm to 7 mm in size. Does report nonproductive cough for the past several weeks.  Non-smoker.  Concern for possible metastatic disease.  -- Current work-up in process by primary -- does have a CT abd/pelvis ordered for today as well. I have asked that this be delayed until tomorrow given the multiple tests with contrast Jacob Gonzales will receive today  3.  Acute combined systolic and diastolic heart failure: Echo this admission shows EF of 40 to 45% with global hypokinesis and grade 1 diastolic dysfunction.  Etiology unclear. Does not have signs of acute volume overload on exam. --Has been started on metoprolol 12.5 mg twice daily --Consider resuming ACE/ARB post cath -- plan to optimize with GDMT post cath  4.  Hypertension: Blood pressures elevated in the ED --Has been started on metoprolol 12.5 mg twice daily along with home lisinopril --Plan further titration post cath  5.  Diabetes: States Jacob Gonzales is on Trulicity prior to admission --Hemoglobin A1c 6.8 --Sliding scale while inpatient  6. Hyperlipidemia: LDL 158 --Add atorvastatin 80mg  daily   Risk Assessment/Risk Scores:       :[id number]}   TIMI Risk Score for Unstable Angina or Non-ST Elevation MI:   The Jacob Gonzales's TIMI risk score is 3, which indicates a 13% risk of all cause mortality, new or recurrent myocardial infarction or need for urgent revascularization in the next 14 days.{  For questions or updates, please contact [facility] Please consult [email or web address] for contact info under    Signed, Reino Bellis, NP  01/15/2021 3:13 PM  I have examined the Jacob Gonzales and reviewed assessment and plan and discussed with Jacob Gonzales.  Agree with above as stated.  NSTEMI with sx concerning for cardiac origin.  Shortness of breath that Jacob Gonzales  had was likely related to ischemia.  I think Jacob Gonzales needs to have a cath today, given the highly typical nature of Jacob Gonzales sx and Jacob Gonzales elevated troponin.   Plan for cath.    Abnormal chest CT due to nodules- concern for malignancy, but workup will be limited by cardiac status.  Therefore, I think cath will be needed.  If there is a  severe culprit lesion, I think we will have to treat with PCI and continue with DAPT for at least 30 days, in the event biopsy was needed.   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  Sx started early this AM.  Jacob Gonzales is very functional.    Will delay other CT scans needed at least until tomorrow to prevent too much dye exposure in one day.  Unfortunately, Jacob Gonzales wife passed away recently.  Jacob Gonzales son lives in [place].    Longterm, Jacob Gonzales will need RF control including BP, lipids and DM control.   Jacob Gonzales is in agreement with cardiac w/u.    Larae Grooms

## 2021-01-15 NOTE — ED Notes (Signed)
Patient transported to CT 

## 2021-01-15 NOTE — Interval H&P Note (Signed)
Cath Lab Visit (complete for each Cath Lab visit)  Clinical Evaluation Leading to the Procedure:   ACS: Yes.    Non-ACS:    Anginal Classification: CCS IV  Anti-ischemic medical therapy: Minimal Therapy (1 class of medications)  Non-Invasive Test Results: No non-invasive testing performed  Prior CABG: No previous CABG      History and Physical Interval Note:  01/15/2021 4:26 PM  Jacob Gonzales  has presented today for surgery, with the diagnosis of NSTEMI.  The various methods of treatment have been discussed with the patient and family. After consideration of risks, benefits and other options for treatment, the patient has consented to  Procedure(s): LEFT HEART CATH AND CORONARY ANGIOGRAPHY (N/A) as a surgical intervention.  The patient's history has been reviewed, patient examined, no change in status, stable for surgery.  I have reviewed the patient's chart and labs.  Questions were answered to the patient's satisfaction.     Larae Grooms

## 2021-01-15 NOTE — ED Notes (Signed)
Pt transported to xray 

## 2021-01-15 NOTE — ED Provider Notes (Signed)
[facility] EMERGENCY DEPARTMENT Provider Note   CSN: [id number] Arrival date & time: 01/15/21  4235     History Chief Complaint  Patient presents with   Chest Pain    Jacob Gonzales is a 65 y.o. male.  Patient is a 65 year old male with past medical history of diabetes, hypertension, prior CVA.  Patient presents today for evaluation of chest discomfort.  This woke him from sleep at approximately 3 AM.  He describes a heaviness in the center of his chest.  This is associated with some shortness of breath, but denies nausea, diaphoresis, or radiation to the arm or jaw.  He denies any fevers or chills.  Patient denies any personal cardiac history.  He has been seen by Dr. Harrington Challenger in cardiology in the past.  He denies prior heart cath or stents.  The history is provided by the patient.  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   Arthritis    Bilateral shoulders, Hands, Knees   Bone cancer ([place]) 2016   Brain cancer ([place]) 2016   Diabetes ([place]) 11/17/2015   HTN (hypertension) 11/17/2015    Patient Active Problem List   Diagnosis Date Noted   Other chest pain    Syncope 03/03/2019   Acute arterial ischemic stroke, vertebrobasilar, 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   Diabetes ([place]) 11/17/2015   HLD (hyperlipidemia) 11/17/2015   HTN (hypertension) 11/17/2015   Non compliance w medication regimen 11/17/2015   CVA (cerebral infarction) 07/11/2015    Past Surgical History:  Procedure Laterality Date   NO PAST SURGERIES         Family History  Problem Relation Age of Onset    Heart attack Mother    Heart attack Father    Diabetes Mellitus II Father    Kidney failure Father    Uterine cancer Sister    Heart attack Brother    Heart attack Maternal Aunt    Heart attack Maternal Uncle    Heart attack Paternal Aunt    Heart attack Paternal Uncle    Stroke Neg Hx     Social History   Tobacco Use   Smoking status: Never Smoker   Smokeless tobacco: Never Used  Scientific laboratory technician Use: Never used  Substance Use Topics   Alcohol use: No    Alcohol/week: 0.0 standard drinks   Drug use: No    Home Medications Prior to Admission medications   Medication Sig Start Date End Date Taking? Authorizing Provider  amLODipine (NORVASC) 10 MG tablet Take 10 mg by mouth daily. for high blood pressure 02/16/19   [provider]  aspirin 81 MG chewable tablet Chew 1 tablet (81 mg total) by mouth daily. 07/12/15   Vaughan Basta, MD  lisinopril (PRINIVIL,ZESTRIL) 10 MG tablet Take 1 tablet (10 mg total) by mouth daily. Patient taking differently: Take 40 mg by mouth daily.  07/12/15   Vaughan Basta, MD    Allergies    Patient has no known allergies.  Review of Systems   Review of Systems  Cardiovascular: Positive for chest pain.  All other systems reviewed and are negative.  Physical Exam Updated Vital Signs BP (!) 164/91    Pulse 95    Temp 99 F (37.2 C) (Oral)    Resp 17    Ht 5\' 11"  (1.803 m)    Wt (!) 140 kg    SpO2 97%    BMI 43.05 kg/m   Physical Exam Vitals and nursing note reviewed.  Constitutional:      General: He is not in acute distress.    Appearance: He is well-developed and well-nourished. He is not diaphoretic.  HENT:     Head: Normocephalic and atraumatic.     Mouth/Throat:     Mouth: Oropharynx is clear and moist.  Cardiovascular:     Rate and Rhythm: Normal rate and regular rhythm.     Heart sounds: No murmur heard. No friction rub.  Pulmonary:     Effort: Pulmonary effort is normal. No  respiratory distress.     Breath sounds: Normal breath sounds. No wheezing or rales.  Abdominal:     General: Bowel sounds are normal. There is no distension.     Palpations: Abdomen is soft.     Tenderness: There is no abdominal tenderness.  Musculoskeletal:        General: No edema. Normal range of motion.     Cervical back: Normal range of motion and neck supple.  Skin:    General: Skin is warm and dry.  Neurological:     Mental Status: He is alert and oriented to person, place, and time.     Coordination: Coordination normal.     ED Results / Procedures / Treatments   Labs (all labs ordered are listed, but only abnormal results are displayed) Labs Reviewed  CBC - Abnormal; Notable for the following components:      Result Value   WBC 13.0 (*)    All other components within normal limits  BASIC METABOLIC PANEL  PROTIME-INR  TROPONIN I (HIGH SENSITIVITY)    EKG EKG Interpretation  Date/Time:  Friday January 15 2021 05:16:55 EST Ventricular Rate:  97 PR Interval:  146 QRS Duration: 94 QT Interval:  366 QTC Calculation: 464 R Axis:   -8 Text Interpretation: Normal sinus rhythm Incomplete right bundle branch block Nonspecific ST abnormality Abnormal ECG No significant change since 03/04/2019 Confirmed by Veryl Speak [phone]) on 01/15/2021 5:32:30 AM   Radiology DG Chest 2 View  Result Date: 01/15/2021 CLINICAL DATA:  65 year old male with chest pain. EXAM: CHEST - 2 VIEW COMPARISON:  Chest radiographs 03/03/2019. FINDINGS: Lung volumes and mediastinal contours are stable and within normal limits. Mild eventration of the diaphragm (normal variant). Visualized tracheal air column is within normal limits. No pneumothorax. No pulmonary edema, pleural effusion or confluent pulmonary opacity. No acute osseous abnormality identified. Negative visible bowel gas pattern. IMPRESSION: Negative.  No acute cardiopulmonary abnormality. Electronically Signed   By: Genevie Ann M.D.   On:  01/15/2021 05:50    Procedures Procedures   Medications Ordered in ED Medications - No data to display  ED Course  I have reviewed the triage vital signs and the nursing notes.  Pertinent labs & imaging results that were available during my care of the patient were reviewed by me and considered in my medical decision making (see chart for details).    MDM Rules/Calculators/A&P  Patient is a 65 year old male presenting with complaints of chest discomfort.  This apparently woke him from sleep at approximately 3 AM.  He describes a constant ache to the center of his  chest with associated shortness of breath.  Patient has multiple risk factors, but no prior cardiac history.  Patient's EKG shows no acute changes.  Troponin is mildly elevated at 22.  Patient to undergo second troponin and likely admission.  Care to be signed out to Dr. Alvino Chapel at shift change.  He will obtain the results of the troponin and determine the final disposition.  Final Clinical Impression(s) / ED Diagnoses Final diagnoses:  None    Rx / DC Orders ED Discharge Orders    None       Veryl Speak, MD 01/15/21 2259

## 2021-01-15 NOTE — H&P (View-Only) (Signed)
Cardiology Consultation:   Patient ID: Jacob Gonzales MRN: [id number]; DOB: 20-Oct-1956  Admit date: 01/15/2021 Date of Consult: 01/15/2021  PCP:  Philmore Pali, NP   [facility]  Cardiologist:  Larae Grooms, MD  Advanced Practice Provider:  No care team member to display Electrophysiologist:  None   :[id number]}  Patient Profile:   Jacob Gonzales is a 65 y.o. male with a hx of DM2, HTN, CVA who is being seen today for the evaluation of chest pain/NSVT at the request of Dr. Philipp Ovens.  History of Present Illness:   Jacob Gonzales is a 65 yo male with PMH noted above. He was seen by Dr. Harrington Challenger back in 06/2019 for syncope. Work up was negative and echo showed normal EF.    He currently lives at home independently.  Has a farm and takes care of animals.  Does not have any anginal symptoms or shortness of breath with this type of activity.  Does report developing a cough several weeks ago which has been nonproductive.  Notes some lower extremity edema with trouble getting his socks and shoes on over the past couple of weeks as well.  Presented to the ER on 2/11 sudden onset of left-sided chest pressure and tightness with shortness of breath and nausea which woke him from sleep at 3 AM.  Symptoms persisted for about 30 minutes and he called his son to bring him to the ER to be evaluated.  In the ER his labs showed sodium 134, potassium 3.7, creatinine 0.89, high-sensitivity troponin 22>> 74>> 2824, WBC 13, D-dimer 1.3, hemoglobin A1c 6.8, TSH 0.93.  LDL 158, HDL 27 EKG showed sinus rhythm 97 bpm, nonspecific changes with incomplete right bundle branch block.  Underwent CT angiogram which showed scattered pulmonary nodules lesions throughout the lungs ranging in size from 2 mm to 7 mm and multiple enlarged hilar and mediastinal lymph nodes with largest measuring 2.3 x 2.1 cm.   He was started on IV heparin also given Plavix load of 300 mg.  Admitted to internal medicine for further  management.  Had notable 21 beat run of nonsustained VT on telemetry.  Echocardiogram showed EF of 40 to 45% global hypokinesis, grade 1 diastolic dysfunction, trivial pericardial effusion.  In talking with patient he reports improvement in symptoms with receiving sublingual nitro while in the ER.   In talking with the patient he does report strong family history of coronary disease in his mother, father and sister.  All having had MIs.  Past Medical History:  Diagnosis Date  . Acute arterial ischemic stroke, vertebrobasilar, thalamic ([place]) 11/17/2015  . Arthritis    Bilateral shoulders, Hands, Knees  . Bone cancer ([place]) 2016  . Brain cancer ([place]) 2016  . Diabetes ([place]) 11/17/2015  . HTN (hypertension) 11/17/2015  . NSTEMI (non-ST elevated myocardial infarction) [facility])     Past Surgical History:  Procedure Laterality Date  . NO PAST SURGERIES      Home Medications:  Prior to Admission medications   Medication Sig Start Date End Date Taking? Authorizing Provider  amLODipine (NORVASC) 10 MG tablet Take 10 mg by mouth daily. for high blood pressure 02/16/19   [provider]  aspirin 81 MG chewable tablet Chew 1 tablet (81 mg total) by mouth daily. 07/12/15   Vaughan Basta, MD  lisinopril (PRINIVIL,ZESTRIL) 10 MG tablet Take 1 tablet (10 mg total) by mouth daily. Patient taking differently: Take 40 mg by mouth daily.  07/12/15   Vaughan Basta, MD  Inpatient Medications: Scheduled Meds: . [START ON 01/16/2021] aspirin EC  81 mg Oral Daily  . insulin aspart  0-15 Units Subcutaneous TID WC  . insulin aspart  0-5 Units Subcutaneous QHS  . lisinopril  40 mg Oral Daily  . metoprolol tartrate  12.5 mg Oral BID  . sodium chloride flush  3 mL Intravenous Q12H   Continuous Infusions: . heparin 1,450 Units/hr (01/15/21 1211)   PRN Meds: acetaminophen **OR** acetaminophen, labetalol, nitroGLYCERIN, senna-docusate  Allergies:   No Known Allergies  Social History:    Social History   Socioeconomic History  . Marital status: Married    Spouse name: Not on file  . Number of children: 3  . Years of education: Not on file  . Highest education level: Not on file  Occupational History  . Not on file  Tobacco Use  . Smoking status: Never Smoker  . Smokeless tobacco: Never Used  Vaping Use  . Vaping Use: Never used  Substance and Sexual Activity  . Alcohol use: No    Alcohol/week: 0.0 standard drinks  . Drug use: No  . Sexual activity: Not on file  Other Topics Concern  . Not on file  Social History Narrative   Lives at home with wife   Caffeine use: Drinks soda (2-12packs per week)   Social Determinants of Health   Financial Resource Strain: Not on file  Food Insecurity: Not on file  Transportation Needs: Not on file  Physical Activity: Not on file  Stress: Not on file  Social Connections: Not on file  Intimate Partner Violence: Not on file    Family History:    Family History  Problem Relation Age of Onset  . Heart attack Mother   . Heart attack Father   . Diabetes Mellitus II Father   . Kidney failure Father   . Uterine cancer Sister   . Heart attack Brother   . Heart attack Maternal Aunt   . Heart attack Maternal Uncle   . Heart attack Paternal Aunt   . Heart attack Paternal Uncle   . Stroke Neg Hx      ROS:  Please see the history of present illness.   All other ROS reviewed and negative.     Physical Exam/Data:   Vitals:   01/15/21 1345 01/15/21 1400 01/15/21 1415 01/15/21 1430  BP: (!) 156/75 (!) 149/86 (!) 154/81   Pulse: 72 71 75 77  Resp: 20 18 (!) 23 20  Temp:      TempSrc:      SpO2: 94% 92% 95% 95%  Weight:      Height:       No intake or output data in the 24 hours ending 01/15/21 1513 Last 3 Weights 01/15/2021 06/06/2019 06/03/2019  Weight (lbs) 308 lb 10.3 oz 271 lb 260 lb  Weight (kg) 140 kg 122.925 kg 117.935 kg     Body mass index is 43.05 kg/m.  General:  Well nourished, well developed, in no  acute distress HEENT: normal Lymph: no adenopathy Neck: no JVD Endocrine:  No thryomegaly Vascular: No carotid bruits; FA pulses 2+ bilaterally without bruits  Cardiac:  normal S1, S2; RRR; no murmur  Lungs:  clear to auscultation bilaterally, no wheezing, rhonchi or rales  Abd: soft, nontender, no hepatomegaly  Ext: no edema Musculoskeletal:  No deformities, BUE and BLE strength normal and equal Skin: warm and dry  Neuro:  CNs 2-12 intact, no focal abnormalities noted Psych:  Normal affect  EKG:  The EKG was personally reviewed and demonstrates: Sinus rhythm, 97 bpm nonspecific changes, incomplete right bundle branch block Telemetry:  Telemetry was personally reviewed and demonstrates: Sinus rhythm, 21 beat run of nonsustained VT  Relevant CV Studies:  Echo: 01/15/21  IMPRESSIONS    1. Left ventricular ejection fraction, by estimation, is 40 to 45%. The  left ventricle has mildly decreased function. The left ventricle  demonstrates global hypokinesis. There is moderate left ventricular  hypertrophy. Left ventricular diastolic  parameters are consistent with Grade I diastolic dysfunction (impaired  relaxation). Elevated left ventricular end-diastolic pressure.  2. Right ventricular systolic function is normal. The right ventricular  size is normal.  3. Left atrial size was mildly dilated.  4. The trivial pericardial effusion is posterior to the left ventricle.  5. The mitral valve is grossly normal. Trivial mitral valve  regurgitation.  6. The aortic valve is tricuspid. Aortic valve regurgitation is not  visualized.  7. The inferior vena cava is normal in size with greater than 50%  respiratory variability, suggesting right atrial pressure of 3 mmHg.   Comparison(s): Changes from prior study are noted. 02/05/2019: LVEF 55-60%.   Laboratory Data:  High Sensitivity Troponin:   Recent Labs  Lab 01/15/21 0518 01/15/21 0704 01/15/21 1155  TROPONINIHS 22* 74* 2,824*      Chemistry Recent Labs  Lab 01/15/21 0518  NA 134*  K 3.7  CL 100  CO2 21*  GLUCOSE 200*  BUN 11  CREATININE 0.89  CALCIUM 8.1*  GFRNONAA >60  ANIONGAP 13    Recent Labs  Lab 01/15/21 1155  PROT 7.6  ALBUMIN 2.8*  AST 34  ALT 27  ALKPHOS 74  BILITOT 0.7   Hematology Recent Labs  Lab 01/15/21 0518 01/15/21 1155  WBC 13.0* 11.8*  RBC 4.65 4.57  HGB 14.7 13.9  HCT 41.3 40.6  MCV 88.8 88.8  MCH 31.6 30.4  MCHC 35.6 34.2  RDW 12.2 12.3  PLT 357 344   BNPNo results for input(s): BNP, PROBNP in the last 168 hours.  DDimer  Recent Labs  Lab 01/15/21 0704  DDIMER 1.31*     Radiology/Studies:  DG Chest 2 View  Result Date: 01/15/2021 CLINICAL DATA:  65 year old male with chest pain. EXAM: CHEST - 2 VIEW COMPARISON:  Chest radiographs 03/03/2019. FINDINGS: Lung volumes and mediastinal contours are stable and within normal limits. Mild eventration of the diaphragm (normal variant). Visualized tracheal air column is within normal limits. No pneumothorax. No pulmonary edema, pleural effusion or confluent pulmonary opacity. No acute osseous abnormality identified. Negative visible bowel gas pattern. IMPRESSION: Negative.  No acute cardiopulmonary abnormality. Electronically Signed   By: Genevie Ann M.D.   On: 01/15/2021 05:50   CT Angio Chest PE W and/or Wo Contrast  Result Date: 01/15/2021 CLINICAL DATA:  Shortness of breath with positive D-dimer study EXAM: CT ANGIOGRAPHY CHEST WITH CONTRAST TECHNIQUE: Multidetector CT imaging of the chest was performed using the standard protocol during bolus administration of intravenous contrast. Multiplanar CT image reconstructions and MIPs were obtained to evaluate the vascular anatomy. CONTRAST:  92mL OMNIPAQUE IOHEXOL 350 MG/ML SOLN COMPARISON:  January 15, 2021 FINDINGS: Cardiovascular: There is no demonstrable pulmonary embolus. There is no thoracic aortic aneurysm or dissection. Visualized great vessels appear unremarkable.  There are foci of aortic atherosclerosis as well as multiple foci of coronary artery calcification. There is a minimal pericardial effusion with pericardial fluid likely at upper physiologic range. No pericardial thickening. Mediastinum/Nodes: Visualized thyroid appears unremarkable.  There are multiple enlarged hilar and mediastinal lymph nodes. There is a lymph node immediately to the left of the upper carina measuring 1.8 x 1.6 cm. A lymph node to the right of the distal most aspect of the trachea measures 1.3 x 1.2 cm. A nearby lymph node slightly more superiorly to the right of the carina measures 1.5 x 1.1 cm. There are left hilar lymph nodes, largest measuring 1.7 x 1.7 cm. A lymph node slightly inferior to the left hilum measures 2.2 x 1.4 cm. There are several subcarinal lymph nodes with the largest subcarinal lymph node measuring 2.3 x 2.1 cm. There are multiple right hilar lymph nodes with the largest individual lymph node in the infrahilar region on the right measuring 2.0 x 2.0 cm. No esophageal lesions are evident. Lungs/Pleura: There are scattered pulmonary nodular lesions throughout the lungs bilaterally ranging in size from as small as 2 mm to as large as 7 mm. Largest nodular lesion is in the lateral segment right lower lobe seen on axial slice 52 series 6. There are areas of somewhat mosaic attenuation in the lungs bilaterally, most notably in the right upper lobe. There is no well-defined airspace consolidation. No appreciable pleural effusions. No pneumothorax. Trachea and major bronchial structures appear patent. Upper Abdomen: Visualized upper abdominal structures appear unremarkable. Musculoskeletal: There are foci of degenerative change in the thoracic spine. There are no blastic or lytic bone lesions. No chest wall lesions are evident. Review of the MIP images confirms the above findings. IMPRESSION: 1. No demonstrable pulmonary embolus. No thoracic aortic aneurysm or dissection. There are  foci of aortic atherosclerosis as well as coronary artery calcification. 2. Extensive adenopathy at multiple sites as noted. Neoplastic etiology suspected given this degree of adenopathy. 3. Scattered subcentimeter pulmonary nodular opacities which potentially could represent small metastases given the associated adenopathy. No larger pulmonary nodular lesions evident. 4. Areas of mosaic attenuation may indicate a combination of atelectasis and underlying small airways obstructive disease. No well-defined consolidation. Aortic Atherosclerosis (ICD10-I70.0). Electronically Signed   By: Lowella Grip III M.D.   On: 01/15/2021 08:47   ECHOCARDIOGRAM COMPLETE  Result Date: 01/15/2021    ECHOCARDIOGRAM REPORT   Patient Name:   Jacob Gonzales Date of Exam: 01/15/2021 Medical Rec #:  [id number]   Height:       71.0 in Accession #:    [id number]  Weight:       308.6 lb Date of Birth:  1956-08-11  BSA:          2.536 m Patient Age:    56 years    BP:           185/110 mmHg Patient Gender: M           HR:           74 bpm. Exam Location:  Inpatient Procedure: 2D Echo, Cardiac Doppler and Color Doppler Indications:    Pericardial effusion, chest pain  History:        Patient has prior history of Echocardiogram examinations, most                 recent 02/05/2019. Signs/Symptoms:Shortness of Breath and Chest                 Pain. Elevated troponin.  Sonographer:    Dustin Flock Referring Phys: [place]  1. Left ventricular ejection fraction, by estimation, is 40 to 45%. The left ventricle has mildly decreased function. The left ventricle demonstrates  global hypokinesis. There is moderate left ventricular hypertrophy. Left ventricular diastolic parameters are consistent with Grade I diastolic dysfunction (impaired relaxation). Elevated left ventricular end-diastolic pressure.  2. Right ventricular systolic function is normal. The right ventricular size is normal.  3. Left atrial size was mildly  dilated.  4. The trivial pericardial effusion is posterior to the left ventricle.  5. The mitral valve is grossly normal. Trivial mitral valve regurgitation.  6. The aortic valve is tricuspid. Aortic valve regurgitation is not visualized.  7. The inferior vena cava is normal in size with greater than 50% respiratory variability, suggesting right atrial pressure of 3 mmHg. Comparison(s): Changes from prior study are noted. 02/05/2019: LVEF 55-60%. FINDINGS  Left Ventricle: Left ventricular ejection fraction, by estimation, is 40 to 45%. The left ventricle has mildly decreased function. The left ventricle demonstrates global hypokinesis. The left ventricular internal cavity size was normal in size. There is  moderate left ventricular hypertrophy. Left ventricular diastolic parameters are consistent with Grade I diastolic dysfunction (impaired relaxation). Elevated left ventricular end-diastolic pressure. Right Ventricle: The right ventricular size is normal. No increase in right ventricular wall thickness. Right ventricular systolic function is normal. Left Atrium: Left atrial size was mildly dilated. Right Atrium: Right atrial size was normal in size. Pericardium: Trivial pericardial effusion is present. The pericardial effusion is posterior to the left ventricle. Mitral Valve: The mitral valve is grossly normal. Trivial mitral valve regurgitation. Tricuspid Valve: The tricuspid valve is grossly normal. Tricuspid valve regurgitation is trivial. Aortic Valve: The aortic valve is tricuspid. Aortic valve regurgitation is not visualized. Pulmonic Valve: The pulmonic valve was not well visualized. Pulmonic valve regurgitation is not visualized. Aorta: The aortic root and ascending aorta are structurally normal, with no evidence of dilitation. Venous: The inferior vena cava is normal in size with greater than 50% respiratory variability, suggesting right atrial pressure of 3 mmHg. IAS/Shunts: No atrial level shunt detected  by color flow Doppler.  LEFT VENTRICLE PLAX 2D LVIDd:         5.70 cm  Diastology LVIDs:         4.50 cm  LV e' medial:    4.35 cm/s LV PW:         1.40 cm  LV E/e' medial:  20.7 LV IVS:        1.40 cm  LV e' lateral:   7.18 cm/s LVOT diam:     2.30 cm  LV E/e' lateral: 12.5 LV SV:         67 LV SV Index:   26 LVOT Area:     4.15 cm  RIGHT VENTRICLE RV Basal diam:  2.80 cm RV S prime:     11.60 cm/s TAPSE (M-mode): 3.2 cm LEFT ATRIUM             Index       RIGHT ATRIUM           Index LA diam:        4.50 cm 1.77 cm/m  RA Area:     14.40 cm LA Vol (A2C):   90.9 ml 35.85 ml/m RA Volume:   38.30 ml  15.11 ml/m LA Vol (A4C):   81.0 ml 31.95 ml/m LA Biplane Vol: 87.5 ml 34.51 ml/m  AORTIC VALVE LVOT Vmax:   82.90 cm/s LVOT Vmean:  55.400 cm/s LVOT VTI:    0.161 m  AORTA Ao Root diam: 3.30 cm MITRAL VALVE MV Area (PHT): 3.72 cm    SHUNTS MV  Decel Time: 204 msec    Systemic VTI:  0.16 m MV E velocity: 90.00 cm/s  Systemic Diam: 2.30 cm MV A velocity: 73.70 cm/s MV E/A ratio:  1.22 Lyman Bishop MD Electronically signed by Lyman Bishop MD Signature Date/Time: 01/15/2021/12:58:16 PM    Final      Assessment and Plan:   Jacob Gonzales is a 65 y.o. male with a hx of DM2, HTN, CVA who is being seen today for the evaluation of chest pain/NSVT at the request of Dr. Philipp Ovens.  1.  Non-STEMI: High-sensitivity troponin rose to 2824, EKG with nonspecific changes.  Does have concerning risk factors with strong family history of CAD as well as hypertension, hyperlipidemia and diabetes.  --Currently on IV heparin and has been given a loading dose of Plavix 300 mg --On aspirin 81 mg, metoprolol 12.5 mg twice daily --Discussed with patient concern for ongoing ACS and need for cardiac cath.  He is agreeable to proceed. The patient understands that risks included but are not limited to stroke (1 in 1000), death (1 in 76), kidney failure [usually temporary] (1 in 500), bleeding (1 in 200), allergic reaction [possibly  serious] (1 in 200).   2.  Pulmonary nodules: Positive D-dimer, CT angio showed multiple enlarged hilar and mediastinal lymph nodes with largest measuring 2.3 x 2.1 cm with multiple pulmonary nodules ranging from 2 mm to 7 mm in size. Does report nonproductive cough for the past several weeks.  Non-smoker.  Concern for possible metastatic disease.  -- Current work-up in process by primary -- does have a CT abd/pelvis ordered for today as well. I have asked that this be delayed until tomorrow given the multiple tests with contrast he will receive today  3.  Acute combined systolic and diastolic heart failure: Echo this admission shows EF of 40 to 45% with global hypokinesis and grade 1 diastolic dysfunction.  Etiology unclear. Does not have signs of acute volume overload on exam. --Has been started on metoprolol 12.5 mg twice daily --Consider resuming ACE/ARB post cath -- plan to optimize with GDMT post cath  4.  Hypertension: Blood pressures elevated in the ED --Has been started on metoprolol 12.5 mg twice daily along with home lisinopril --Plan further titration post cath  5.  Diabetes: States he is on Trulicity prior to admission --Hemoglobin A1c 6.8 --Sliding scale while inpatient  6. Hyperlipidemia: LDL 158 --Add atorvastatin 80mg  daily   Risk Assessment/Risk Scores:       :[id number]}   TIMI Risk Score for Unstable Angina or Non-ST Elevation MI:   The patient's TIMI risk score is 3, which indicates a 13% risk of all cause mortality, new or recurrent myocardial infarction or need for urgent revascularization in the next 14 days.{  For questions or updates, please contact [place] Please consult [email or web address] for contact info under    Signed, Reino Bellis, NP  01/15/2021 3:13 PM  I have examined the patient and reviewed assessment and plan and discussed with patient.  Agree with above as stated.  NSTEMI with sx concerning for cardiac origin.  Plan for cath.     Abnormal chest CT, but workup will be limited by cardiac status.  Therefore, I think cath will be needed.  If there is a severe culprit lesion, I think we will have to treat with PCI and continue with DAPT for 30 days, in the even tbiopsy was needed.   He has been active on his farm.  Sx started early this AM.  Patient is in agreement with cardiac w/u.    Larae Grooms

## 2021-01-15 NOTE — Progress Notes (Signed)
[place] [facility] for heparin Indication: chest pain/ACS  Heparin Dosing Weight: 107.9 kg  Labs: Recent Labs    01/15/21 0518 01/15/21 0704  HGB 14.7  --   HCT 41.3  --   PLT 357  --   LABPROT 13.4  --   INR 1.1  --   CREATININE 0.89  --   TROPONINIHS 22* 74*    Estimated Creatinine Clearance: 123.2 mL/min (by C-G formula based on SCr of 0.89 mg/dL).  Assessment: 32 yom presenting with CP, elevated high-sensitivity troponin. Pharmacy consulted to dose heparin. Patient is not on anticoagulation PTA. CBC wnl, d-dimer 1.31. CTA negative for PE, thoracic aortic aneurysm, or dissection. No active bleed issues documented.  Goal of Therapy:  Heparin level 0.3-0.7 units/ml Monitor platelets by anticoagulation protocol: Yes   Plan:  Heparin 4000 unit bolus Start heparin at 1450 units/hr 6hr heparin level Monitor daily CBC, s/sx bleeding   Arturo Morton, PharmD, BCPS Please check AMION for all [place] contact numbers Clinical Pharmacist 01/15/2021 8:37 AM

## 2021-01-15 NOTE — Progress Notes (Signed)
  Echocardiogram 2D Echocardiogram has been performed.  Jacob Gonzales 01/15/2021, 11:46 AM

## 2021-01-15 NOTE — ED Notes (Signed)
PT

## 2021-01-15 NOTE — ED Notes (Signed)
PT with 21 run of V-tach.  Pt a-symptomatic.  MD notified and at bedside.

## 2021-01-15 NOTE — Progress Notes (Signed)
ANTICOAGULATION CONSULT NOTE  Pharmacy Consult for heparin Indication: chest pain/ACS  Heparin Dosing Weight: 107.9 kg  Labs: Recent Labs    01/15/21 0518 01/15/21 0704 01/15/21 1155  HGB 14.7  --  13.9  HCT 41.3  --  40.6  PLT 357  --  344  LABPROT 13.4  --   --   INR 1.1  --   --   CREATININE 0.89  --   --   TROPONINIHS 22* 74* 2,824*    Estimated Creatinine Clearance: 123.2 mL/min (by C-G formula based on SCr of 0.89 mg/dL).  Assessment: 68 yom presenting with CP, elevated high-sensitivity troponin. Pharmacy consulted to dose heparin. Patient is not on anticoagulation PTA. CBC wnl, d-dimer 1.31. CTA negative for PE, thoracic aortic aneurysm, or dissection. No active bleed issues documented.  Heparin level this afternoon therapeutic (HL 0.42, goal of 0.3-0.7). Noted patient is now in cath - will f/u for plans for anticoagulation once completed.   Goal of Therapy:  Heparin level 0.3-0.7 units/ml Monitor platelets by anticoagulation protocol: Yes   Plan:  - Heparin at 1450 units/hr - turned off for cath - F/u plans for anticoag s/p cath  Thank you for allowing pharmacy to be a part of this patient's care.  Alycia Rossetti, PharmD, BCPS Clinical Pharmacist Clinical phone for 01/15/2021: [phone] 01/15/2021 5:35 PM   **Pharmacist phone directory can now be found on [email or web address] ([email or web address]).  Listed under [place].

## 2021-01-15 NOTE — ED Notes (Signed)
Cardiology aware of trop in the 2,000's

## 2021-01-15 NOTE — ED Triage Notes (Signed)
Patient woke up at 3 am this morning with left chest tightness and SOB , no emesis or diaphoresis , denies cough or fever .

## 2021-01-15 NOTE — Progress Notes (Signed)
TR BAND REMOVAL  LOCATION:  right radial  DEFLATED PER PROTOCOL:  Yes.    TIME BAND OFF / DRESSING APPLIED:   2230   SITE UPON ARRIVAL:   Level 1  SITE AFTER BAND REMOVAL:  Level 1  CIRCULATION SENSATION AND MOVEMENT:  Within Normal Limits  Yes.    COMMENTS:  Difficult to assess whether swelling is hematoma vs tissue displacement; resolved somewhat post band removal. Otherwise no change since arrival to unit.

## 2021-01-15 NOTE — ED Notes (Signed)
MD notified of trops trending up.

## 2021-01-15 NOTE — ED Notes (Signed)
Lunch Tray Ordered @ [id number].

## 2021-01-15 NOTE — H&P (Addendum)
Date: 01/15/2021               Patient Name:  Jacob Gonzales MRN: [id number]  DOB: 08-22-1956 Age / Sex: 65 y.o., male   PCP: Philmore Pali, NP         Medical Service: Internal Medicine Teaching Service         Attending Physician: Dr. Velna Ochs, MD    First Contact: Dr. Collene Gobble Pager: [phone]  Second Contact: Dr. Darrick Meigs Pager: [phone]       After Hours (After 5p/  First Contact Pager: [phone]  weekends / holidays): Second Contact Pager: [phone]   Chief Complaint: chest pain  History of Present Illness:   Jacob Gonzales is a 65yo male with PMH TIIDM, hypertension, prior CVA with residual left sided weakness presenting with central chest pain that started yesterday evening. He described the pain as sharp and constant, he also had associated right sided neck pain, both of which resolved once he received nitroglycerin here. The pain was 5/10. He has been having some shortness of breath, mainly this morning with his chest pain, which has resolved. He has had a cough for about three weeks now. This is non-productive. He has no sore throat or sinus pain.  He has chronic left arm numbness and tingling from a prior stroke and this is unchanged. He denies nausea, back pain or abdominal pain. He has chronic lower extremity neuropathy which is also unchanged.  He states that he has also been feeling dizzy the last three weeks. He has not had any falls recently.  He endorses headaches that are worse in the morning.  He feels like he has had worsening vision over the last three months and his left eye has been hurting since yesterday. The pain seems to be on the inside and it has been red. He does not think he got anything in his eye but works outside frequently.  He denies fever, chills, weight loss, night sweats, dysuria, diarrhea, constipation, blood in his stool, black stool. He does endorse increased urinary frequency, during the day and at night. No difficulty starting his stream. No  sensation of incomplete emptying.  He has no rashes or cuts that he's aware of. He does not think he has been bitten by anything. 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  He has not had any sick contacts that he knows of. He has not traveled out of the country or further than [place], and around the [place] for the past several years. He had one overnight stay in a local jail around 20 years ago.   He sees Charlott Holler as his PCP but does not follow-up often. He uses trulicity once a week for his   ED Course:  On arrival patient had elevated blood pressure of 179/102, 97% on RA, temperature of 99. Patient was having intermittent desats and was placed temporarily on [place]. He was given asa 325 and nitroglycerin with relief of his chest pain. ECG showed new RBBB. Trops were 22 > 74. D-dimer was 1.31. CTA of the chest showed no PE but multiple enlarged lymph nodes, small pulmonary nodules and mosaic/atalectic changes. He was also started on heparin gtt.   Social:   He lives in [place] and has a farm in [place]. His wife passed away last Mar 08, 2023. His son  also lives in [place].  He has never used tobacco. He often drinks about two beers per day but does not usually drink during the winter.   Family History:   Mother - passed away from an MI at 31.  Father - TIIDM, ESRD, amputee, CAD, passed from an MI at age 63 Sister - died from an MI at age 67. History of he thinks uterine mass which was benign  Brother - 45 cirrhosis, he thinks from tylenol. He did not drink until development of liver disease as far as he knows.   Family History  Problem Relation Age of Onset  . Heart attack Mother   . Heart attack Father   . Diabetes Mellitus II Father   . Kidney failure Father   . Uterine cancer Sister   . Heart attack Brother   . Heart attack Maternal Aunt   . Heart attack Maternal  Uncle   . Heart attack Paternal Aunt   . Heart attack Paternal Uncle   . Stroke Neg Hx     Meds:  No outpatient medications have been marked as taking for the 01/15/21 encounter [facility] Encounter).     Allergies: Allergies as of 01/15/2021  . (No Known Allergies)   Past Medical History:  Diagnosis Date  . Acute arterial ischemic stroke, vertebrobasilar, thalamic ([place]) 11/17/2015  . Arthritis    Bilateral shoulders, Hands, Knees  . Bone cancer ([place]) 2016  . Brain cancer ([place]) 2016  . Diabetes ([place]) 11/17/2015  . HTN (hypertension) 11/17/2015     Review of Systems: A complete ROS was negative except as per HPI.   Physical Exam: Blood pressure (!) 185/110, pulse (!) 155, temperature 99 F (37.2 C), temperature source Oral, resp. rate (!) 24, height '5\' 11"'  (1.803 m), weight (!) 140 kg, SpO2 (!) 48 %.  Constitution: NAD, sitting up in bed, obese  HENT: [place]/AT, no cervical lymphadenopathy  Eyes: injection with medial pterygium left eye, lateral pinguencula right eye; eom intact, PERRLA  Cardio: RRR, no m/r/g, no LE edema, +pedal and radial pulses, no JVP Respiratory: Clear to auscultation Abdominal: soft, non-distended, NTTP, normal BS  MSK: LUE strength and left grip strength 4/5; otherwise 5/5  Other extremities; no axillary lymphadenopathy Neuro: alert and oriented, pleasant, CN II-XII reviewed and intact  Skin: scattered small flaky red skin plaques over the arms and hands bilaterally   EKG: personally reviewed my interpretation is normal sinus rhythm with new RBBB.   CXR: personally reviewed my interpretation is cardiomegaly, no acute findings   CTA Chest 01/15/2021 1. No demonstrable pulmonary embolus. No thoracic aortic aneurysm or dissection. There are foci of aortic atherosclerosis as well as coronary artery calcification. 2. Extensive adenopathy at multiple sites as noted. Neoplastic etiology suspected given this degree of adenopathy. 3. Scattered  subcentimeter pulmonary nodular opacities which potentially could represent small metastases given the associated adenopathy. No larger pulmonary nodular lesions evident. 4. Areas of mosaic attenuation may indicate a combination of atelectasis and underlying small airways obstructive disease. No well-defined consolidation.  Aortic Atherosclerosis (ICD10-I70.0). Electronically Signed   By: Lowella Grip III M.D.   On: 01/15/2021 08:47  Assessment & Plan by Problem: Active Problems:   Chest pain  NSTEMI New RBBB Small Pericardial Effusion Hypertension Symptoms of unstable angina which have improved s/p nitroglycerin in addition to new ECG changes significant for RBBB. Trops 27 > 74. 21 beat run of Vtach on tele. He has received asa 325 mg and was started on IV heparin  in the ER.   He has increased risk of cardiac disease with TIIDM, HTN, family history with sister passing from [place] (71), father (44) and mother (23).   - consult sent to cardiology, appreciate recommendations.  - trend troponin - echo  - give plavix loading dose, did not order subsequent dosing  - continue heparin, continue asa 81 mg  - cont. Home lisinopril, hold  - no signs of acute HF, start metop 12.5 mg bid  - keep K>4, check Mg  - hemoglobin a1c, lipid panel ordered  Thoracic Adenopathy Small Pulmonary Nodules  Multiple enlarged hilar and mediastinal lymph nodes with the largest measuring 2.3x2.1cm with multiple pulmonary nodules ranging from 42m - 723min size. He has been having non-productive cough the past three weeks. +Leukocytosis 13, temperature 99, afebrile. No B symptoms. +AM headaches and new change in vision. Differential includes malignancy, metastasis, possible infectious etiology with leukocytosis, patient works on a farm and is frequently outside. No peripheral lymphadenopathy. He also endorses increase urinary frequency, especially at night over the last several months.   - CT head w/wo contrast   - blood cultures  - ESR/CRP  - add LFTs - add differential - check HIV  - check PSA  - will need pulmonology consult after ACS addressed.    TIIDM Takes trulicity at home.   - SSI moderate - check a1c   Increased Urinary Frequencly Likely BPH, but with new pulmonary nodules and adenopathy, will check PSA.  - start flomax after PSA  - UA   Diet: HH/CM  VTE: heparin  IVF: none Code: DNR  Dispo: Admit patient to Inpatient with expected length of stay greater than 2 midnights.  Signed: Marty HeckDO 01/15/2021, 12:07 PM  Pager: [phone]

## 2021-01-15 NOTE — ED Provider Notes (Signed)
  Physical Exam  BP (!) 155/83   Pulse 84   Temp 99 F (37.2 C) (Oral)   Resp 20   Ht 5\' 11"  (1.803 m)   Wt (!) 140 kg   SpO2 92%   BMI 43.05 kg/m   Physical Exam  ED Course/Procedures     Procedures  MDM  Received patient in signout. Began with shortness of breath last night. Worse with laying back. Does not smoke. No cough. Also has dull pressure with it. Comes and goes and lasts for a minute at a time and gets bad. However first troponin is mildly elevated. Has mild hypoxia at times also. Troponin has elevated with a delta of about 50. Does better on nasal cannula oxygen mild desaturations. D-dimer done due to the primary respiratory component and was elevated. CT scan done and shows no blood clots but does show lymphadenopathy in possible metastatic disease. Heparin has been started due to elevated troponins. Has a upper limits of normal pericardial effusion. Will also need to monitor since potential pericarditis as a cause. Will admit to unassigned medicine since PCP is down in [place].       Davonna Belling, MD 01/15/21 [phone]

## 2021-01-15 NOTE — Progress Notes (Signed)
Progress Note  Patient Name: Jacob Gonzales Date of Encounter: 01/16/2021  [facility] Cardiologist: Larae Grooms, MD   Subjective   Doing well. Just went for CT abdomen/pelvis. Right radial access site c/d/i. Asking to go home today.  Underwent coronary angiography found to have 99% stenosis of OM2 s/p successful placement of DES x1. Also with mild-to-moderate non-obstructive disease in LAD and RCA.  Inpatient Medications    Scheduled Meds: . amLODipine  10 mg Oral Daily  . aspirin  81 mg Oral Daily  . atorvastatin  80 mg Oral Daily  . heparin  5,000 Units Subcutaneous Q8H  . HYDROcodone-acetaminophen  2 tablet Oral Once  . insulin aspart  0-15 Units Subcutaneous TID WC  . insulin aspart  0-5 Units Subcutaneous QHS  . lisinopril  40 mg Oral Daily  . metoprolol tartrate  12.5 mg Oral BID  . sodium chloride flush  3 mL Intravenous Q12H  . sodium chloride flush  3 mL Intravenous Q12H  . ticagrelor  90 mg Oral BID   Continuous Infusions: . sodium chloride    . sodium chloride    . sodium chloride     PRN Meds: sodium chloride, sodium chloride, acetaminophen, nitroGLYCERIN, ondansetron (ZOFRAN) IV, senna-docusate, sodium chloride flush, sodium chloride flush   Vital Signs    Vitals:   01/15/21 2358 01/16/21 0000 01/16/21 0530 01/16/21 0834  BP: (!) 141/69 (!) 141/69 (!) 154/92 (!) 160/89  Pulse: 91 90 84 80  Resp: 19 20 17 18   Temp: 98 F (36.7 C) 98.5 F (36.9 C) 98.5 F (36.9 C) 98.8 F (37.1 C)  TempSrc: Oral Oral Oral Oral  SpO2: 90% 95% 97% 98%  Weight:   (!) 141.7 kg   Height:        Intake/Output Summary (Last 24 hours) at 01/16/2021 0922 Last data filed at 01/16/2021 0539 Gross per 24 hour  Intake --  Output 700 ml  Net -700 ml   Last 3 Weights 01/16/2021 01/15/2021 06/06/2019  Weight (lbs) 312 lb 6.3 oz 308 lb 10.3 oz 271 lb  Weight (kg) 141.7 kg 140 kg 122.925 kg      Telemetry    NSR with occasional PVCs - Personally Reviewed  ECG     NSR with LAD - Personally Reviewed  Physical Exam   GEN: No acute distress.   Neck: No JVD Cardiac: RRR, no murmurs, rubs, or gallops.  Respiratory: Clear to auscultation bilaterally. GI: Soft, nontender, non-distended  MS: No edema; No deformity. Right radial access site c/di/i. No hematoma Neuro:  Nonfocal  Psych: Normal affect   Labs    High Sensitivity Troponin:   Recent Labs  Lab 01/15/21 0518 01/15/21 0704 01/15/21 1155 01/15/21 1548  TROPONINIHS 22* 74* 2,824* 11,019*      Chemistry Recent Labs  Lab 01/15/21 0518 01/15/21 1155 01/16/21 0317  NA 134*  --  134*  K 3.7  --  4.2  CL 100  --  102  CO2 21*  --  23  GLUCOSE 200*  --  172*  BUN 11  --  9  CREATININE 0.89  --  0.80  CALCIUM 8.1*  --  8.5*  PROT  --  7.6 7.2  ALBUMIN  --  2.8* 2.8*  AST  --  34 67*  ALT  --  27 34  ALKPHOS  --  74 73  BILITOT  --  0.7 0.8  GFRNONAA >60  --  >60  ANIONGAP 13  --  9     Hematology Recent Labs  Lab 01/15/21 0518 01/15/21 1155 01/16/21 0317  WBC 13.0* 11.8* 12.0*  RBC 4.65 4.57 4.41  HGB 14.7 13.9 13.9  HCT 41.3 40.6 38.2*  MCV 88.8 88.8 86.6  MCH 31.6 30.4 31.5  MCHC 35.6 34.2 36.4*  RDW 12.2 12.3 12.2  PLT 357 344 359    BNPNo results for input(s): BNP, PROBNP in the last 168 hours.   DDimer  Recent Labs  Lab 01/15/21 0704  DDIMER 1.31*     Radiology    DG Chest 2 View  Result Date: 01/15/2021 CLINICAL DATA:  65 year old male with chest pain. EXAM: CHEST - 2 VIEW COMPARISON:  Chest radiographs 03/03/2019. FINDINGS: Lung volumes and mediastinal contours are stable and within normal limits. Mild eventration of the diaphragm (normal variant). Visualized tracheal air column is within normal limits. No pneumothorax. No pulmonary edema, pleural effusion or confluent pulmonary opacity. No acute osseous abnormality identified. Negative visible bowel gas pattern. IMPRESSION: Negative.  No acute cardiopulmonary abnormality. Electronically Signed   By:  Genevie Ann M.D.   On: 01/15/2021 05:50   CT Angio Chest PE W and/or Wo Contrast  Result Date: 01/15/2021 CLINICAL DATA:  Shortness of breath with positive D-dimer study EXAM: CT ANGIOGRAPHY CHEST WITH CONTRAST TECHNIQUE: Multidetector CT imaging of the chest was performed using the standard protocol during bolus administration of intravenous contrast. Multiplanar CT image reconstructions and MIPs were obtained to evaluate the vascular anatomy. CONTRAST:  20mL OMNIPAQUE IOHEXOL 350 MG/ML SOLN COMPARISON:  January 15, 2021 FINDINGS: Cardiovascular: There is no demonstrable pulmonary embolus. There is no thoracic aortic aneurysm or dissection. Visualized great vessels appear unremarkable. There are foci of aortic atherosclerosis as well as multiple foci of coronary artery calcification. There is a minimal pericardial effusion with pericardial fluid likely at upper physiologic range. No pericardial thickening. Mediastinum/Nodes: Visualized thyroid appears unremarkable. There are multiple enlarged hilar and mediastinal lymph nodes. There is a lymph node immediately to the left of the upper carina measuring 1.8 x 1.6 cm. A lymph node to the right of the distal most aspect of the trachea measures 1.3 x 1.2 cm. A nearby lymph node slightly more superiorly to the right of the carina measures 1.5 x 1.1 cm. There are left hilar lymph nodes, largest measuring 1.7 x 1.7 cm. A lymph node slightly inferior to the left hilum measures 2.2 x 1.4 cm. There are several subcarinal lymph nodes with the largest subcarinal lymph node measuring 2.3 x 2.1 cm. There are multiple right hilar lymph nodes with the largest individual lymph node in the infrahilar region on the right measuring 2.0 x 2.0 cm. No esophageal lesions are evident. Lungs/Pleura: There are scattered pulmonary nodular lesions throughout the lungs bilaterally ranging in size from as small as 2 mm to as large as 7 mm. Largest nodular lesion is in the lateral segment right  lower lobe seen on axial slice 52 series 6. There are areas of somewhat mosaic attenuation in the lungs bilaterally, most notably in the right upper lobe. There is no well-defined airspace consolidation. No appreciable pleural effusions. No pneumothorax. Trachea and major bronchial structures appear patent. Upper Abdomen: Visualized upper abdominal structures appear unremarkable. Musculoskeletal: There are foci of degenerative change in the thoracic spine. There are no blastic or lytic bone lesions. No chest wall lesions are evident. Review of the MIP images confirms the above findings. IMPRESSION: 1. No demonstrable pulmonary embolus. No thoracic aortic aneurysm or dissection. There are  foci of aortic atherosclerosis as well as coronary artery calcification. 2. Extensive adenopathy at multiple sites as noted. Neoplastic etiology suspected given this degree of adenopathy. 3. Scattered subcentimeter pulmonary nodular opacities which potentially could represent small metastases given the associated adenopathy. No larger pulmonary nodular lesions evident. 4. Areas of mosaic attenuation may indicate a combination of atelectasis and underlying small airways obstructive disease. No well-defined consolidation. Aortic Atherosclerosis (ICD10-I70.0). Electronically Signed   By: Lowella Grip III M.D.   On: 01/15/2021 08:47   CARDIAC CATHETERIZATION  Result Date: 01/15/2021  Dist RCA lesion is 25% stenosed.  RPDA lesion is 25% stenosed.  Mid LM to Dist LM lesion is 25% stenosed.  2nd Diag lesion is 25% stenosed.  Ost LAD to Prox LAD lesion is 25% stenosed.  Mid LAD-1 lesion is 50% stenosed.  Mid LAD-2 lesion is 25% stenosed.  Ost Cx to Prox Cx lesion is 25% stenosed.  2nd Mrg lesion is 99% stenosed. A drug-eluting stent was successfully placed using a STENT RESOLUTE ONYX 3.0X22, postdilated to 3.75 in the proximal and mid stent, optimized with IVUS.  Post intervention, there is a 0% residual stenosis.  The  left ventricular ejection fraction is 45-50% by visual estimate.  There is mild left ventricular systolic dysfunction.  LV end diastolic pressure is normal.  There is no aortic valve stenosis.  Continue dual antiplatelet therapy with aspirin and Brilinta, ideally for 1 year.   High dose statin. Given the pulmonary nodules, may need to stop DAPT earlier, but minimum will be 30 days.  Continue aggressive secondary prevention.  Follow renal function to determine if further CT scans can be done.   ECHOCARDIOGRAM COMPLETE  Result Date: 01/15/2021    ECHOCARDIOGRAM REPORT   Patient Name:   Jacob Gonzales Date of Exam: 01/15/2021 Medical Rec #:  [id number]   Height:       71.0 in Accession #:    [id number]  Weight:       308.6 lb Date of Birth:  Feb 08, 1956  BSA:          2.536 m Patient Age:    32 years    BP:           185/110 mmHg Patient Gender: M           HR:           74 bpm. Exam Location:  Inpatient Procedure: 2D Echo, Cardiac Doppler and Color Doppler Indications:    Pericardial effusion, chest pain  History:        Patient has prior history of Echocardiogram examinations, most                 recent 02/05/2019. Signs/Symptoms:Shortness of Breath and Chest                 Pain. Elevated troponin.  Sonographer:    Dustin Flock Referring Phys: [place]  1. Left ventricular ejection fraction, by estimation, is 40 to 45%. The left ventricle has mildly decreased function. The left ventricle demonstrates global hypokinesis. There is moderate left ventricular hypertrophy. Left ventricular diastolic parameters are consistent with Grade I diastolic dysfunction (impaired relaxation). Elevated left ventricular end-diastolic pressure.  2. Right ventricular systolic function is normal. The right ventricular size is normal.  3. Left atrial size was mildly dilated.  4. The trivial pericardial effusion is posterior to the left ventricle.  5. The mitral valve is grossly normal. Trivial mitral valve  regurgitation.  6. The aortic  valve is tricuspid. Aortic valve regurgitation is not visualized.  7. The inferior vena cava is normal in size with greater than 50% respiratory variability, suggesting right atrial pressure of 3 mmHg. Comparison(s): Changes from prior study are noted. 02/05/2019: LVEF 55-60%. FINDINGS  Left Ventricle: Left ventricular ejection fraction, by estimation, is 40 to 45%. The left ventricle has mildly decreased function. The left ventricle demonstrates global hypokinesis. The left ventricular internal cavity size was normal in size. There is  moderate left ventricular hypertrophy. Left ventricular diastolic parameters are consistent with Grade I diastolic dysfunction (impaired relaxation). Elevated left ventricular end-diastolic pressure. Right Ventricle: The right ventricular size is normal. No increase in right ventricular wall thickness. Right ventricular systolic function is normal. Left Atrium: Left atrial size was mildly dilated. Right Atrium: Right atrial size was normal in size. Pericardium: Trivial pericardial effusion is present. The pericardial effusion is posterior to the left ventricle. Mitral Valve: The mitral valve is grossly normal. Trivial mitral valve regurgitation. Tricuspid Valve: The tricuspid valve is grossly normal. Tricuspid valve regurgitation is trivial. Aortic Valve: The aortic valve is tricuspid. Aortic valve regurgitation is not visualized. Pulmonic Valve: The pulmonic valve was not well visualized. Pulmonic valve regurgitation is not visualized. Aorta: The aortic root and ascending aorta are structurally normal, with no evidence of dilitation. Venous: The inferior vena cava is normal in size with greater than 50% respiratory variability, suggesting right atrial pressure of 3 mmHg. IAS/Shunts: No atrial level shunt detected by color flow Doppler.  LEFT VENTRICLE PLAX 2D LVIDd:         5.70 cm  Diastology LVIDs:         4.50 cm  LV e' medial:    4.35 cm/s LV PW:          1.40 cm  LV E/e' medial:  20.7 LV IVS:        1.40 cm  LV e' lateral:   7.18 cm/s LVOT diam:     2.30 cm  LV E/e' lateral: 12.5 LV SV:         67 LV SV Index:   26 LVOT Area:     4.15 cm  RIGHT VENTRICLE RV Basal diam:  2.80 cm RV S prime:     11.60 cm/s TAPSE (M-mode): 3.2 cm LEFT ATRIUM             Index       RIGHT ATRIUM           Index LA diam:        4.50 cm 1.77 cm/m  RA Area:     14.40 cm LA Vol (A2C):   90.9 ml 35.85 ml/m RA Volume:   38.30 ml  15.11 ml/m LA Vol (A4C):   81.0 ml 31.95 ml/m LA Biplane Vol: 87.5 ml 34.51 ml/m  AORTIC VALVE LVOT Vmax:   82.90 cm/s LVOT Vmean:  55.400 cm/s LVOT VTI:    0.161 m  AORTA Ao Root diam: 3.30 cm MITRAL VALVE MV Area (PHT): 3.72 cm    SHUNTS MV Decel Time: 204 msec    Systemic VTI:  0.16 m MV E velocity: 90.00 cm/s  Systemic Diam: 2.30 cm MV A velocity: 73.70 cm/s MV E/A ratio:  1.22 Lyman Bishop MD Electronically signed by Lyman Bishop MD Signature Date/Time: 01/15/2021/12:58:16 PM    Final     Cardiac Studies   TTE 01/15/21: IMPRESSIONS  1. Left ventricular ejection fraction, by estimation, is 40 to 45%. The  left ventricle has  mildly decreased function. The left ventricle  demonstrates global hypokinesis. There is moderate left ventricular  hypertrophy. Left ventricular diastolic  parameters are consistent with Grade I diastolic dysfunction (impaired  relaxation). Elevated left ventricular end-diastolic pressure.  2. Right ventricular systolic function is normal. The right ventricular  size is normal.  3. Left atrial size was mildly dilated.  4. The trivial pericardial effusion is posterior to the left ventricle.  5. The mitral valve is grossly normal. Trivial mitral valve  regurgitation.  6. The aortic valve is tricuspid. Aortic valve regurgitation is not  visualized.  7. The inferior vena cava is normal in size with greater than 50%  respiratory variability, suggesting right atrial pressure of 3 mmHg.   Comparison(s):  Changes from prior study are noted. 02/05/2019: LVEF 55-60%.   Cath 01/15/21:  Dist RCA lesion is 25% stenosed.  RPDA lesion is 25% stenosed.  Mid LM to Dist LM lesion is 25% stenosed.  2nd Diag lesion is 25% stenosed.  Ost LAD to Prox LAD lesion is 25% stenosed.  Mid LAD-1 lesion is 50% stenosed.  Mid LAD-2 lesion is 25% stenosed.  Ost Cx to Prox Cx lesion is 25% stenosed.  2nd Mrg lesion is 99% stenosed. A drug-eluting stent was successfully placed using a STENT RESOLUTE ONYX 3.0X22, postdilated to 3.75 in the proximal and mid stent, optimized with IVUS.  Post intervention, there is a 0% residual stenosis.  The left ventricular ejection fraction is 45-50% by visual estimate.  There is mild left ventricular systolic dysfunction.  LV end diastolic pressure is normal.  There is no aortic valve stenosis.   Continue dual antiplatelet therapy with aspirin and Brilinta, ideally for 1 year.   High dose statin.   Given the pulmonary nodules, may need to stop DAPT earlier, but minimum will be 30 days.    Continue aggressive secondary prevention.  Follow renal function to determine if further CT scans can be done.  Diagnostic Dominance: Right    Intervention     Implants        Patient Profile     65 y.o. male with history of DM2, HTN, CVA who presented with chest pressure, SOB and nausea that woke him up from sleep found to have elevated troponin c/w NSTEMI. Course complicated by 21 beat of VT. Underwent LHC on 01/15/21 which revealed 99% occlusion of OM2 now s/p PCI with DES.  Assessment & Plan    #NSTEMI: #NSVT: Patient presented with chest pain, SOB and nausea that awoke him from sleep found to have trop as high as 2824, EKG with nonspecific changes.  Tele with 21 beat run of NSVT. Underwent coronary angiography which revealed 99% occlusion of OM2 s/p PCI. Mild-to-moderate non-obstructive disease elsewhere.  -Continue ASA 81mg  daily and ticagrelor 90mg  BID;  goal for 1 year but may need to stop earlier due to pulmonary nodules/lymphadenopathy detected on coronary CT -Change metop to coreg for better blood pressure control -Continue lisinopril 40mg  daily -Cardiac Rehab   #Pulmonary nodules: Positive D-dimer, CT angio showed multiple enlarged hilar and mediastinal lymph nodes with largest measuring 2.3 x 2.1 cm with multiple pulmonary nodules ranging from 2 mm to 7 mm in size. Does report nonproductive cough for the past several weeks.  Non-smoker.  Concern for possible metastatic disease.  -Current work-up in process by primary  #Acute combined systolic and diastolic heart failure: Echo this admission shows EF of 40 to 45% with global hypokinesis and grade 1 diastolic dysfunction.   -Change  metop to coreg 6.25mg  BID for better blood pressure control -Continue lisinopril 40mg  daily -Start farxiga 5mg  daily -Monitor I/Os and daily weights -Low Na diet  # Hypertension: Blood pressures elevated in the ED -Continue amlodipine 10mg  -Change metop to coreg as above -Continue lisinopril 40mg  daily  # Diabetes: States he is on Trulicity prior to admission -Hemoglobin A1c 6.8 -Sliding scale while inpatient -Start farxiga as above  # Hyperlipidemia: LDL 158 -Continue atorvastatin 80mg  daily   Okay to discharge home from CV perspective. We will arrange for follow-up.     For questions or updates, please contact [place] Please consult [email or web address] for contact info under        Signed, Freada Bergeron, MD  01/16/2021, 9:22 AM

## 2021-01-15 NOTE — ED Notes (Signed)
Patient noted to have chest pain with 02 sat of 86%, patient placed on 3L [place].

## 2021-01-16 ENCOUNTER — Inpatient Hospital Stay (HOSPITAL_COMMUNITY): Payer: Medicaid Other

## 2021-01-16 DIAGNOSIS — R778 Other specified abnormalities of plasma proteins: Secondary | ICD-10-CM | POA: Diagnosis not present

## 2021-01-16 DIAGNOSIS — R918 Other nonspecific abnormal finding of lung field: Secondary | ICD-10-CM | POA: Diagnosis not present

## 2021-01-16 DIAGNOSIS — I214 Non-ST elevation (NSTEMI) myocardial infarction: Secondary | ICD-10-CM | POA: Diagnosis not present

## 2021-01-16 DIAGNOSIS — R079 Chest pain, unspecified: Secondary | ICD-10-CM | POA: Diagnosis not present

## 2021-01-16 LAB — CBC WITH DIFFERENTIAL/PLATELET
Abs Immature Granulocytes: 0.14 10*3/uL — ABNORMAL HIGH (ref 0.00–0.07)
Basophils Absolute: 0.1 10*3/uL (ref 0.0–0.1)
Basophils Relative: 1 %
Eosinophils Absolute: 0.4 10*3/uL (ref 0.0–0.5)
Eosinophils Relative: 3 %
HCT: 38.2 % — ABNORMAL LOW (ref 39.0–52.0)
Hemoglobin: 13.9 g/dL (ref 13.0–17.0)
Immature Granulocytes: 1 %
Lymphocytes Relative: 14 %
Lymphs Abs: 1.7 10*3/uL (ref 0.7–4.0)
MCH: 31.5 pg (ref 26.0–34.0)
MCHC: 36.4 g/dL — ABNORMAL HIGH (ref 30.0–36.0)
MCV: 86.6 fL (ref 80.0–100.0)
Monocytes Absolute: 1.3 10*3/uL — ABNORMAL HIGH (ref 0.1–1.0)
Monocytes Relative: 11 %
Neutro Abs: 8.3 10*3/uL — ABNORMAL HIGH (ref 1.7–7.7)
Neutrophils Relative %: 70 %
Platelets: 359 10*3/uL (ref 150–400)
RBC: 4.41 MIL/uL (ref 4.22–5.81)
RDW: 12.2 % (ref 11.5–15.5)
WBC: 12 10*3/uL — ABNORMAL HIGH (ref 4.0–10.5)
nRBC: 0 % (ref 0.0–0.2)

## 2021-01-16 LAB — COMPREHENSIVE METABOLIC PANEL
ALT: 34 U/L (ref 0–44)
AST: 67 U/L — ABNORMAL HIGH (ref 15–41)
Albumin: 2.8 g/dL — ABNORMAL LOW (ref 3.5–5.0)
Alkaline Phosphatase: 73 U/L (ref 38–126)
Anion gap: 9 (ref 5–15)
BUN: 9 mg/dL (ref 8–23)
CO2: 23 mmol/L (ref 22–32)
Calcium: 8.5 mg/dL — ABNORMAL LOW (ref 8.9–10.3)
Chloride: 102 mmol/L (ref 98–111)
Creatinine, Ser: 0.8 mg/dL (ref 0.61–1.24)
GFR, Estimated: >60 mL/min (ref >60)
Glucose, Bld: 172 mg/dL — ABNORMAL HIGH (ref 70–99)
Potassium: 4.2 mmol/L (ref 3.5–5.1)
Sodium: 134 mmol/L — ABNORMAL LOW (ref 135–145)
Total Bilirubin: 0.8 mg/dL (ref 0.3–1.2)
Total Protein: 7.2 g/dL (ref 6.5–8.1)

## 2021-01-16 LAB — GLUCOSE, CAPILLARY
Glucose-Capillary: 156 mg/dL — ABNORMAL HIGH (ref 70–99)
Glucose-Capillary: 133 mg/dL — ABNORMAL HIGH (ref 70–99)

## 2021-01-16 MED ORDER — PNEUMOCOCCAL VAC POLYVALENT 25 MCG/0.5ML IJ INJ
0.5000 mL | INJECTION | INTRAMUSCULAR | Status: AC
  Administered 2021-01-16: 0.5 mL via INTRAMUSCULAR

## 2021-01-16 MED ORDER — IOHEXOL 300 MG/ML  SOLN
100.0000 mL | Freq: Once | INTRAMUSCULAR | Status: AC | PRN
  Administered 2021-01-16: 100 mL via INTRAVENOUS

## 2021-01-16 MED ORDER — TICAGRELOR 90 MG PO TABS: 90.0000 mg | ORAL_TABLET | Freq: Two times a day (BID) | ORAL | 0 refills | Status: DC

## 2021-01-16 MED ORDER — DAPAGLIFLOZIN PROPANEDIOL 5 MG PO TABS: 5.0000 mg | ORAL_TABLET | Freq: Every day | ORAL | 0 refills | Status: DC

## 2021-01-16 MED ORDER — DAPAGLIFLOZIN PROPANEDIOL 5 MG PO TABS
5.0000 mg | ORAL_TABLET | Freq: Every day | ORAL | Status: DC
  Administered 2021-01-16: 5 mg via ORAL
  Filled 2021-01-16: qty 1

## 2021-01-16 MED ORDER — ATORVASTATIN CALCIUM 80 MG PO TABS: 80.0000 mg | ORAL_TABLET | Freq: Every day | ORAL | 0 refills | Status: DC

## 2021-01-16 MED ORDER — CARVEDILOL 6.25 MG PO TABS: 6.2500 mg | ORAL_TABLET | Freq: Two times a day (BID) | ORAL | 0 refills | Status: DC

## 2021-01-16 MED ORDER — NITROGLYCERIN 0.4 MG SL SUBL: 0.4000 mg | SUBLINGUAL_TABLET | SUBLINGUAL | 0 refills | Status: DC | PRN

## 2021-01-16 MED ORDER — LISINOPRIL 40 MG PO TABS: 40.0000 mg | ORAL_TABLET | Freq: Every day | ORAL | 0 refills | Status: DC

## 2021-01-16 MED ORDER — TICAGRELOR 90 MG PO TABS: 90.0000 mg | ORAL_TABLET | Freq: Two times a day (BID) | ORAL | 3 refills | Status: DC

## 2021-01-16 MED ORDER — CARVEDILOL 6.25 MG PO TABS: 6.2500 mg | ORAL_TABLET | Freq: Two times a day (BID) | ORAL | Status: DC

## 2021-01-16 NOTE — Progress Notes (Signed)
Patient taken at 1010hrs for CT.

## 2021-01-16 NOTE — Discharge Summary (Addendum)
Name: Jacob Gonzales MRN: [id number] DOB: 04-17-1956 65 y.o. PCP: Philmore Pali, NP  Date of Admission: 01/15/2021  5:12 AM Date of Discharge:  01/16/2021 Attending Physician: Velna Ochs, MD  Subjective: Patient evaluated at bedside this AM. Reports he is feeling much better, denies fever, chest pain, palpitations, dyspnea overnight. States he feels ready to go home today. Mentions he has previously scheduled appointment with PCP in March, encouraged patient to re-schedule within the next week.   Discharge Diagnosis: 1. NSTEMI 2. Pulmonary nodules with adenopathy 3. Acute combined systolic, diastolic heart failure 4. Hypertension 5. Type II diabetes mellitus 6. Hyperlipidemia  Discharge Medications: Allergies as of 01/16/2021   No Known Allergies      Medication List     TAKE these medications    amLODipine 10 MG tablet Commonly known as: NORVASC Take 10 mg by mouth daily. for high blood pressure   aspirin 81 MG chewable tablet Chew 1 tablet (81 mg total) by mouth daily.   atorvastatin 80 MG tablet Commonly known as: LIPITOR Take 1 tablet (80 mg total) by mouth daily. Start taking on: January 17, 2021   carvedilol 6.25 MG tablet Commonly known as: COREG Take 1 tablet (6.25 mg total) by mouth 2 (two) times daily with a meal.   dapagliflozin propanediol 5 MG Tabs tablet Commonly known as: FARXIGA Take 1 tablet (5 mg total) by mouth daily. Start taking on: January 17, 2021   HYDROcodone-acetaminophen 5-325 MG tablet Commonly known as: Norco Take 2 tablets by mouth every 4 (four) hours as needed.   lisinopril 40 MG tablet Commonly known as: ZESTRIL Take 1 tablet (40 mg total) by mouth daily. Start taking on: January 17, 2021 What changed:  medication strength how much to take   nitroGLYCERIN 0.4 MG SL tablet Commonly known as: NITROSTAT Place 1 tablet (0.4 mg total) under the tongue every 5 (five) minutes as needed for chest pain (CP or SOB).    ticagrelor 90 MG Tabs tablet Commonly known as: BRILINTA Take 1 tablet (90 mg total) by mouth 2 (two) times daily.       Disposition and follow-up:   [name] was discharged from [facility] in Stable condition.  At the hospital follow up visit please address:  1. NSTEMI: Elevated troponin w/ new RBB on ECG. Cardiology performed LHC w/ DES to OM2. Patient asymptomatic and stable since procedure. Will need to continue DAPT for at least 30 days.  2. Pulmonary nodules with adenopathy: On CTA chest, found to have multiple pulmonary nodules with adenopathy. Further imaging of head, abdomen, and pelvis did not reveal obvious malignancy. Will need further work-up with PCP and possibly future referral to pulmonology for biopsy. Previously ANA positive per PCP w/ rheumatology visit, appears lost to follow-up.  3. Acute combined systolic, diastolic heart failure: TTE during admission revealed LVEF 40-45% w/ grade I diastolic heart failure. Optimizing on GDMT, adjust as needed.  4. Hypertension: Markedly elevated BP on arrival to ED. Titrate meds as needed.  5. Type II diabetes mellitus: A1c 6.8 on arrival. Patient to continue home Trulicity. Also adding Farxiga to regimen. Discussed side effects of Farxiga with patient.  6. Hyperlipidemia: High dose statin started.  2.  Labs / imaging needed at time of follow-up: CBC, CMP, chest CT in 3-6 months  3.  Pending labs/ test needing follow-up: PSA  Follow-up Appointments:  Follow-up Information     Jettie Booze, MD Follow up.   Specialties: Cardiology, Radiology,  Interventional Cardiology Why: scheduler will contact you to arrange follow up, please give [place] a call if you do not hear from our scheduler in 3 business days.  Contact information: [address] [place] [address] [phone]         Philmore Pali, NP. Schedule an appointment as soon as possible for a visit in 1 week(s).    Specialty: Nurse Practitioner Why: Please make an appointment with your primary care doctor within the next week. Contact information: [place] [address] [phone]                Hospital Course by problem list: 1. NSTEMI: Patient presented with central chest pain and associated dyspnea, right neck pain that began the evening prior. On arrival patient hypertensive and intermittently hypoxic requiring supplemental oxygen via [place]. ASA, NG given in ED w/ subsequent relief of pain. ECG with new RBBB, troponins 22>74>2800. Patient started on IV heparin and cardiology consulted. TTE revealed EF 40-45%. LHC with coronary angiography performed 2/11 and drug-eluting stent was placed in OM2. Patient started on dual-antiplatelet therapy, which he should take for at minimum 30 days, ideally a year. Cardiology setting up patient as outpatient and he will follow-up with PCP within the next week. 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 Will need to ensure adequate control of hypertension, hyperlipidemia, diabetes to prevent further cardiovascular events. Patient also has strong cardiovascular family history, including his sister, mother, and father.  2. Pulmonary nodules with adenopathy: Patient found to have multiple enlarged hilar and mediastinal lymph nodes on CT chest. Symptomatically reports recent non-productive cough, morning headaches, and recent change in vision. He also reports recent urinary frequency. Further imaging with CT head and CT abdomen/pelvis did not reveal primary malignancy site. Per chart review, patient had previous positive ANA w/ visit to rheumatology last year. Will need further work-up for possible malignancy vs autoimmune disorder vs atypical infection, especially given his risk factors.  3. Acute combined systolic, diastolic heart failure: Patient throughout hospitalization appeared euvolemic without pitting edema or  rales present on exam. No previous diagnosis of heart failure. Echo revealed impaired systolic and diastolic function. Possibly heart failure 2/2 long-standing hypertension, as it appears patient only had one obstructive occlusion per cath. At discharge, patient remains euvolemic. He was started on GDMT and will follow-up with cardiology.  4. Hypertension: Patient had elevated BP throughout hospitalization. Adjusted several medications for better blood pressure control. Patient to follow-up with cardiologist, PCP for further titration as needed.  5. Type II diabetes mellitus: A1c on arrival 6.8. CBG's appropriate for inpatient care. Plan to continue home Trulicity. Will also start SGLT2 given cardiac benefit as well. Discussed possible side effects of SGLT2 with patient.  6. Hyperlipidemia: HDL 27, LDL 158. Patient has multiple cardiac risk factors and evidence of old infarcts on CT. Will need to ensure adequate control of cholesterol to help prevent further cardiovascular events. Started on high-dose statin, will continue at discharge.  Discharge Exam:   BP (!) 153/92 (BP Location: Left Arm)   Pulse 80   Temp 98.1 F (36.7 C) (Oral)   Resp 18   Ht 5\' 11"  (1.803 m)   Wt (!) 141.7 kg   SpO2 98%   BMI 43.57 kg/m  General: Resting comfortably in bed, no acute distress CV: Regular rate, rhythm. No m/r/g appreciated. Pulm: Normal WOB. No use accessory muscles. Extremities: No pitting edema appreciated. Neuro: Awake,  alert, moving extremities appropriately. Psych: Normal mood, speech, affect  Pertinent Labs, Studies, and Procedures:  CBC Latest Ref Rng & Units 01/16/2021 01/15/2021 01/15/2021  WBC 4.0 - 10.5 K/uL 12.0(H) 11.8(H) 13.0(H)  Hemoglobin 13.0 - 17.0 g/dL 13.9 13.9 14.7  Hematocrit 39.0 - 52.0 % 38.2(L) 40.6 41.3  Platelets 150 - 400 K/uL 359 344 357   BMP Latest Ref Rng & Units 01/16/2021 01/15/2021 03/04/2019  Glucose 70 - 99 mg/dL 172(H) 200(H) 74  BUN 8 - 23 mg/dL 9 11 10    Creatinine 0.61 - 1.24 mg/dL 0.80 0.89 0.71  Sodium 135 - 145 mmol/L 134(L) 134(L) 141  Potassium 3.5 - 5.1 mmol/L 4.2 3.7 3.9  Chloride 98 - 111 mmol/L 102 100 105  CO2 22 - 32 mmol/L 23 21(L) 21(L)  Calcium 8.9 - 10.3 mg/dL 8.5(L) 8.1(L) 8.8(L)   TTE 2/11 >  LVEF 40-45%. LV demonstrates global hypokinesis. Moderate left ventricular hypertrophy. LV diastolic parameters consistent with grade I diastolic dysfunction. RV systolic function normal. LA mildly dilated. Trivial pericardial effusion. Trivial mitral regurgitation.  LHC w/ PCI 2/11 > Drug-eluting stent successfully placed in OM2. Patient with mild/moderate non-obstructive disease in LAD, RCA.  CTA chest 2/11 >  No demonstrable pulmonary embolus. No thoracic aortic aneurysm or dissection. There are foci of aortic atherosclerosis and coronary artery calcification. Extensive adenopathy at multiple sites. Neoplastic etiology suspected. Scattered sub-centimeter pulmonary nodular opacities which potentially could represent small metastases given associated adenopathy. No larger pulmonary nodular lesions evident. Areas of mosaic attenuation may indicate combination of atelectasis and underlying small airway obstructive disease.  CT head 2/12 > No CT evidence of intracranial metastatic disease. No evidence of acute intracranial abnormality. 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 Mild atrophy of brain and chronic small vessel ischemic disease.  CT abdomen/pelvis 2/12 > Mild porta-hepatis and peripancreatic adenopathy. Basilar pulmonary nodules with infrahilar adenopathy in lower chest. No obvious primary malignancy site. Coronary atherosclerosis visible. Bosniak category 1 cysts of kidneys. Lumbar spondylosis and degenerative disc disease cause multilevel impingement. Faint dependent density in gallbladder potentially from sludge.   Discharge Instructions: Discharge  Instructions     Amb Referral to Cardiac Rehabilitation   Complete by: As directed    Diagnosis:  NSTEMI Coronary Stents     After initial evaluation and assessments completed: Virtual Based Care may be provided alone or in conjunction with Phase 2 Cardiac Rehab based on patient barriers.: Yes      Mr. Alcala, I am so glad you are feeling better today! You were admitted because of a mild type of heart attack. Our cardiologist performed a catheterization procedure and placed a stent in an artery of your heart to help prevent blockages. In addition, it was found you have nodules in your lungs. To further evaluate, we imaged your abdomen and head as well. Thankfully, we did not see any tumors or cancer. You should visit your primary care physician for further evaluation. Please see the following notes:  -Please make sure to follow-up with the cardiologist and your primary care doctor. It is important that we have you on the correct medications to best protect your heart and further evaluate the lung nodules we found on imaging.  -You will take two anti-platelet medications that will help prevent further clots from forming. You need to make sure to take these every day as prescribed. Please make sure to talk to your cardiologist before stopping either of these medications: Aspirin 81mg ,  once daily Ticagrelor (Brilinta) 90mg , twice daily  -There are other medications you will need to take for your blood pressure and to help improve your heart function. Please see the following list of medications you will take: Carvedilol (Coreg) 6.25mg , twice daily Lisinopril (Zestiril) 40mg , once daily Amlodipine (Norvasc) 10mg , once daily  -Your cardiologist also would like to start you on a medication that helps with your heart function as well as diabetes. This can cause yeast infections for some people, so make sure to discuss with your cardiologist if you are having issues. Please take this as  prescribed: Dapagliflozin Wilder Glade) 5mg , once daily  -Please also make sure to take the following medication for your cholesterol: Atorvastatin (Lipitor) 80mg , once daily  -Continue taking Trulicity for your diabetes.  It was a pleasure meeting you, Mr. Davitt. I hope you stay happy and healthy!  Thank you, Sanjuan Dame, MD  Signed: Sanjuan Dame, MD 01/16/2021, 12:54 PM   Pager: [phone]

## 2021-01-16 NOTE — Progress Notes (Signed)
CARDIAC REHAB PHASE I   PRE:  Rate/Rhythm: 82 SR with PVCs  BP:  Supine: 145/92 Sitting:   Standing:    SaO2: 97% RA  MODE:  Ambulation: 470 ft   POST:  Rate/Rhythm: 90 SR  BP:  Supine: 160/89 Sitting:   Standing:    SaO2: 99% RA  [id number] Patient ambulated 470 ft with assist x1. Patient tolerated ambulation well, gait steady without symptoms. To bed after ambulation, blood pressure elevated before and after walk. MI/stent education complete including restrictions, Brilinta use, CP, NTG, and calling 911, risk factor modification, and activity progression. MI book, heart healthy/diabetic nutrition, and exercise guidelines handouts given. Discussed phase 2 cardiac rehab, and referral sent to program at [facility]. Patient verbalizes understanding of instructions given.   Sol Passer, MS, ACSM CEP

## 2021-01-16 NOTE — Discharge Instructions (Signed)
Radial Site Care  This sheet gives you information about how to care for yourself after your procedure. Your health care provider may also give you more specific instructions. If you have problems or questions, contact your health care provider. What can I expect after the procedure? After the procedure, it is common to have:  Bruising and tenderness at the catheter insertion area. Follow these instructions at home: Medicines  Take over-the-counter and prescription medicines only as told by your health care provider. Insertion site care  Follow instructions from your health care provider about how to take care of your insertion site. Make sure you: ? Wash your hands with soap and water before you change your bandage (dressing). If soap and water are not available, use hand sanitizer. ? Change your dressing as told by your health care provider. ? Leave stitches (sutures), skin glue, or adhesive strips in place. These skin closures may need to stay in place for 2 weeks or longer. If adhesive strip edges start to loosen and curl up, you may trim the loose edges. Do not remove adhesive strips completely unless your health care provider tells you to do that.  Check your insertion site every day for signs of infection. Check for: ? Redness, swelling, or pain. ? Fluid or blood. ? Pus or a bad smell. ? Warmth.  Do not take baths, swim, or use a hot tub until your health care provider approves.  You may shower 24-48 hours after the procedure, or as directed by your health care provider. ? Remove the dressing and gently wash the site with plain soap and water. ? Pat the area dry with a clean towel. ? Do not rub the site. That could cause bleeding.  Do not apply powder or lotion to the site. Activity  For 24 hours after the procedure, or as directed by your health care provider: ? Do not flex or bend the affected arm. ? Do not push or pull heavy objects with the affected arm. ? Do not drive  yourself home from the hospital or clinic. You may drive 24 hours after the procedure unless your health care provider tells you not to. ? Do not operate machinery or power tools.  Do not lift anything that is heavier than 10 lb (4.5 kg), or the limit that you are told, until your health care provider says that it is safe.  Ask your health care provider when it is okay to: ? Return to work or school. ? Resume usual physical activities or sports. ? Resume sexual activity.   General instructions  If the catheter site starts to bleed, raise your arm and put firm pressure on the site. If the bleeding does not stop, get help right away. This is a medical emergency.  If you went home on the same day as your procedure, a responsible adult should be with you for the first 24 hours after you arrive home.  Keep all follow-up visits as told by your health care provider. This is important. Contact a health care provider if:  You have a fever.  You have redness, swelling, or yellow drainage around your insertion site. Get help right away if:  You have unusual pain at the radial site.  The catheter insertion area swells very fast.  The insertion area is bleeding, and the bleeding does not stop when you hold steady pressure on the area.  Your arm or hand becomes pale, cool, tingly, or numb. These symptoms may represent a serious   problem that is an emergency. Do not wait to see if the symptoms will go away. Get medical help right away. Call your local emergency services (911 in the [place] Do not drive yourself to the hospital. Summary  After the procedure, it is common to have bruising and tenderness at the site.  Follow instructions from your health care provider about how to take care of your radial site wound. Check the wound every day for signs of infection.  Do not lift anything that is heavier than 10 lb (4.5 kg), or the limit that you are told, until your health care provider says that it  is safe. This information is not intended to replace advice given to you by your health care provider. Make sure you discuss any questions you have with your health care provider. Document Revised: 12/27/2017 Document Reviewed: 12/27/2017 Elsevier Patient Education  2021 [employer]  

## 2021-01-17 LAB — PSA, TOTAL AND FREE
PSA, Free Pct: 8.2 %
PSA, Free: 0.46 ng/mL
Prostate Specific Ag, Serum: 5.6 ng/mL — ABNORMAL HIGH (ref 0.0–4.0)

## 2021-01-18 ENCOUNTER — Encounter (HOSPITAL_COMMUNITY): Payer: Self-pay | Admitting: Interventional Cardiology

## 2021-01-18 MED FILL — Tirofiban HCl in NaCl 0.9% IV Soln 5 MG/100ML (Base Equiv): INTRAVENOUS | Qty: 100 | Status: AC

## 2021-01-19 ENCOUNTER — Telehealth (HOSPITAL_COMMUNITY): Payer: Self-pay

## 2021-01-19 NOTE — Telephone Encounter (Signed)
Pt insurance is active and benefits verified through Medicaid. Co-pay $3.00, DED $0.00/$0.00 met, out of pocket $0.00/$0.00 met, co-insurance 0%. No pre-authorization required. [employer], 01/19/21 @ 9:12AM, REF#[id number]  Will contact patient to see if he is interested in the Cardiac Rehab Program. If interested, patient will need to complete follow up appt. Once completed, patient will be contacted for scheduling upon review by the RN Navigator.

## 2021-01-19 NOTE — Telephone Encounter (Signed)
Called patient to see if he is interested in the Cardiac Rehab Program. Patient expressed interest. Explained scheduling process and went over insurance, patient verbalized understanding. Will contact patient for scheduling once f/u has been completed.  °

## 2021-01-20 LAB — CULTURE, BLOOD (ROUTINE X 2)
Culture: NO GROWTH
Culture: NO GROWTH
Special Requests: ADEQUATE

## 2021-01-22 ENCOUNTER — Encounter: Payer: Self-pay | Admitting: Emergency Medicine

## 2021-02-02 NOTE — Progress Notes (Signed)
°  °

## 2021-02-03 ENCOUNTER — Institutional Professional Consult (permissible substitution): Payer: Medicaid Other | Admitting: Emergency Medicine

## 2021-02-04 ENCOUNTER — Ambulatory Visit (INDEPENDENT_AMBULATORY_CARE_PROVIDER_SITE_OTHER): Payer: Medicaid Other | Admitting: Interventional Cardiology

## 2021-02-04 ENCOUNTER — Other Ambulatory Visit: Payer: Self-pay

## 2021-02-04 ENCOUNTER — Encounter: Payer: Self-pay | Admitting: Interventional Cardiology

## 2021-02-04 VITALS — BP 108/76 | HR 90 | Ht 71.0 in | Wt 252.8 lb

## 2021-02-04 DIAGNOSIS — I252 Old myocardial infarction: Secondary | ICD-10-CM

## 2021-02-04 DIAGNOSIS — E1159 Type 2 diabetes mellitus with other circulatory complications: Secondary | ICD-10-CM

## 2021-02-04 DIAGNOSIS — E782 Mixed hyperlipidemia: Secondary | ICD-10-CM | POA: Diagnosis not present

## 2021-02-04 DIAGNOSIS — I2510 Atherosclerotic heart disease of native coronary artery without angina pectoris: Secondary | ICD-10-CM

## 2021-02-04 NOTE — Progress Notes (Signed)
Cardiology Office Note   Date:  02/04/2021   ID:  Jacob Gonzales, DOB 12/03/1956, MRN [id number]  PCP:  Philmore Pali, NP    No chief complaint on file.  CAD  Wt Readings from Last 3 Encounters:  02/04/21 252 lb 12.8 oz (114.7 kg)  01/16/21 (!) 312 lb 6.3 oz (141.7 kg)  06/06/19 271 lb (122.9 kg)       History of Present Illness: Jacob Gonzales is a 65 y.o. male  Who had an NSTEMI in 2/22.  Angina was a severe pain in the center of chest.  He had : "Elevated troponin w/ new RBB on ECG. Cardiology performed LHC w/ DES to OM2. Patient asymptomatic and stable since procedure. Will need to continue DAPT for at least 30 days.  Cath showed:  "Dist RCA lesion is 25% stenosed.  RPDA lesion is 25% stenosed.  Mid LM to Dist LM lesion is 25% stenosed.  2nd Diag lesion is 25% stenosed.  Ost LAD to Prox LAD lesion is 25% stenosed.  Mid LAD-1 lesion is 50% stenosed.  Mid LAD-2 lesion is 25% stenosed.  Ost Cx to Prox Cx lesion is 25% stenosed.  2nd Mrg lesion is 99% stenosed. A drug-eluting stent was successfully placed using a STENT RESOLUTE ONYX 3.0X22, postdilated to 3.75 in the proximal and mid stent, optimized with IVUS.  Post intervention, there is a 0% residual stenosis.  The left ventricular ejection fraction is 45-50% by visual estimate.  There is mild left ventricular systolic dysfunction.  LV end diastolic pressure is normal.  There is no aortic valve stenosis.   Continue dual antiplatelet therapy with aspirin and Brilinta, ideally for 1 year.   High dose statin.   Given the pulmonary nodules, may need to stop DAPT earlier, but minimum will be 30 days.    Continue aggressive secondary prevention.  Follow renal function to determine if further CT scans can be done."    Pulmonary nodules with adenopathy noted On CTA chest, found to have multiple pulmonary nodules with adenopathy. Further imaging of head, abdomen, and pelvis did not reveal obvious malignancy. Will  need further work-up with PCP and possibly future referral to pulmonology for biopsy. Previously ANA positive per PCP w/ rheumatology visit, appears lost to follow-up.  Acute combined systolic, diastolic heart failure: TTE during admission revealed LVEF 40-45% w/ grade I diastolic heart failure. Optimizing on GDMT, adjust as needed.  Hypertension: Markedly elevated BP on arrival to ED. Titrate meds as needed.  Type II diabetes mellitus: A1c 6.8 on arrival. Patient to continue home Trulicity. Also adding Farxiga to regimen. Discussed side effects of Farxiga with patient.  Hyperlipidemia: High dose statin started. "  Since hospital discharge, no more info about the pulmonary nodules.  Scans in the hospital were negative for malignancy.  H/o positive ANA in the past.  CVA in the past.  Denies : Chest pain. Dizziness. Leg edema. Nitroglycerin use. Orthopnea. Palpitations. Paroxysmal nocturnal dyspnea. Shortness of breath. Syncope.   No sx like prior angina.    Past Medical History:  Diagnosis Date  . Acute arterial ischemic stroke, vertebrobasilar, thalamic (Jacob Gonzales) 11/17/2015  . Arthritis    Bilateral shoulders, Hands, Knees  . Bone cancer (Jacob Gonzales) 2016  . Brain cancer (Jacob Gonzales) 2016  . Diabetes (Jacob Gonzales) 11/17/2015  . HTN (hypertension) 11/17/2015  . NSTEMI (non-ST elevated myocardial infarction) [facility])     Past Surgical History:  Procedure Laterality Date  . CORONARY STENT INTERVENTION N/A 01/15/2021   Procedure:  CORONARY STENT INTERVENTION;  Surgeon: Jettie Booze, MD;  Location: [facility];  Service: Cardiovascular;  Laterality: N/A;  . INTRAVASCULAR ULTRASOUND/IVUS N/A 01/15/2021   Procedure: Intravascular Ultrasound/IVUS;  Surgeon: Jettie Booze, MD;  Location: [facility];  Service: Cardiovascular;  Laterality: N/A;  . LEFT HEART CATH AND CORONARY ANGIOGRAPHY N/A 01/15/2021   Procedure: LEFT HEART CATH AND CORONARY ANGIOGRAPHY;  Surgeon: Jettie Booze,  MD;  Location: [facility];  Service: Cardiovascular;  Laterality: N/A;  . NO PAST SURGERIES       Current Outpatient Medications  Medication Sig Dispense Refill  . amLODipine (NORVASC) 10 MG tablet Take 10 mg by mouth daily. for high blood pressure    . aspirin 81 MG chewable tablet Chew 1 tablet (81 mg total) by mouth daily. 30 tablet 3  . atorvastatin (LIPITOR) 80 MG tablet Take 1 tablet (80 mg total) by mouth daily. 30 tablet 0  . carvedilol (COREG) 6.25 MG tablet Take 1 tablet (6.25 mg total) by mouth 2 (two) times daily with a meal. 60 tablet 0  . dapagliflozin propanediol (FARXIGA) 5 MG TABS tablet Take 1 tablet (5 mg total) by mouth daily. 30 tablet 0  . HYDROcodone-acetaminophen (NORCO) 5-325 MG tablet Take 2 tablets by mouth every 4 (four) hours as needed. 10 tablet 0  . lisinopril (ZESTRIL) 40 MG tablet Take 1 tablet (40 mg total) by mouth daily. 30 tablet 0  . nitroGLYCERIN (NITROSTAT) 0.4 MG SL tablet Place 1 tablet (0.4 mg total) under the tongue every 5 (five) minutes as needed for chest pain (CP or SOB). 10 tablet 0  . ticagrelor (BRILINTA) 90 MG TABS tablet Take 1 tablet (90 mg total) by mouth 2 (two) times daily. 180 tablet 3   No current facility-administered medications for this visit.    Allergies:   Patient has no known allergies.    Social History:  The patient  reports that he has never smoked. He has never used smokeless tobacco. He reports that he does not drink alcohol and does not use drugs.   Family History:  The patient's family history includes Diabetes Mellitus II in his father; Heart attack in his brother, father, maternal aunt, maternal uncle, mother, paternal aunt, and paternal uncle; Kidney failure in his father; Uterine cancer in his sister.    ROS:  Please see the history of present illness.   Otherwise, review of systems are positive for joint pains.   All other systems are reviewed and negative.    PHYSICAL EXAM: VS:  BP 108/76   Pulse  90   Ht 5\' 11"  (1.803 m)   Wt 252 lb 12.8 oz (114.7 kg)   SpO2 97%   BMI 35.26 kg/m  , BMI Body mass index is 35.26 kg/m. GEN: Well nourished, well developed, in no acute distress  HEENT: normal  Neck: no JVD, carotid bruits, or masses Cardiac: RRR; no murmurs, rubs, or gallops,no edema  Respiratory:  clear to auscultation bilaterally, normal work of breathing GI: soft, nontender, nondistended, + BS MS: no deformity or atrophy , 2+ right radial Skin: warm and dry, no rash Neuro:  Strength and sensation are intact Psych: euthymic mood, full affect    Recent Labs: 01/15/2021: Magnesium 2.0; TSH 0.930 01/16/2021: ALT 34; BUN 9; Creatinine, Ser 0.80; Hemoglobin 13.9; Platelets 359; Potassium 4.2; Sodium 134   Lipid Panel    Component Value Date/Time   CHOL 205 (H) 01/15/2021 1155   TRIG 101  01/15/2021 1155   HDL 27 (L) 01/15/2021 1155   CHOLHDL 7.6 01/15/2021 1155   VLDL 20 01/15/2021 1155   LDLCALC 158 (H) 01/15/2021 1155     Other studies Reviewed: Additional studies/ records that were reviewed today with results demonstrating: LDL 158 in 2/22.   ASSESSMENT AND PLAN:  1.   CAD: No angina.  No bleeding.  Whole food plant-based diet. avoid processed foods.  Avoid NSAIDs.  Has been using 1-2 beers/day prior to MI.  Has reduced since the MI.  2.   Hyperlipidemia: Regular exercise is beneficial as well.  [place]  Recheck lipids in May along with liver tests in 3 months. 3.   HTN: Low-salt diet.  The current medical regimen is effective;  continue present plan and medications. 4.   DM: Avoid concentrated sweets.  A1C 6.8. 5.   Obesity: Has already lost some weight.   6.  Will have pulmonary look at scan to see if referral is needed based on pulmonary nodules.  I recommended COVID vaccines for him.    Current medicines are reviewed at length with the patient today.  The patient concerns regarding his medicines were addressed.  The following changes have been  made:  No change  Labs/ tests ordered today include:  No orders of the defined types were placed in this encounter.   Recommend 150 minutes/week of aerobic exercise Low fat, low carb, high fiber diet recommended  Disposition:   FU in 6 months   Signed, Larae Grooms, MD  02/04/2021 11:24 AM    [facility] [address], [place], [place]  [address] Phone: [phone]; Fax: [phone]

## 2021-02-04 NOTE — Patient Instructions (Signed)
Medication Instructions:  Your physician recommends that you continue on your current medications as directed. Please refer to the Current Medication list given to you today.  *If you need a refill on your cardiac medications before your next appointment, please call your pharmacy*   Lab Work: Your physician recommends that you return for lab work on May 2,2022.  This will be fasting. CMET and Lipid profile.  The lab opens at 7:30 AM  If you have labs (blood work) drawn today and your tests are completely normal, you will receive your results only by: Marland Kitchen MyChart Message (if you have MyChart) OR . A paper copy in the mail If you have any lab test that is abnormal or we need to change your treatment, we will call you to review the results.   Testing/Procedures: none   Follow-Up: At [facility], you and your health needs are our priority.  As part of our continuing mission to provide you with exceptional heart care, we have created designated Provider Care Teams.  These Care Teams include your primary Cardiologist (physician) and Advanced Practice Providers (APPs -  Physician Assistants and Nurse Practitioners) who all work together to provide you with the care you need, when you need it.  We recommend signing up for the patient portal called "MyChart".  Sign up information is provided on this After Visit Summary.  MyChart is used to connect with patients for Virtual Visits (Telemedicine).  Patients are able to view lab/test results, encounter notes, upcoming appointments, etc.  Non-urgent messages can be sent to your provider as well.   To learn more about what you can do with MyChart, go to [email or web address].    Your next appointment:   September 14,2022 at 11:20  The format for your next appointment:   In Person  Provider:   Casandra Doffing, MD   Other Instructions  High-Fiber Eating Plan Fiber, also called dietary fiber, is a type of carbohydrate. It is found foods such as  fruits, vegetables, whole grains, and beans. A high-fiber diet can have many health benefits. Your health care provider may recommend a high-fiber diet to help:  Prevent constipation. Fiber can make your bowel movements more regular.  Lower your cholesterol.  Relieve the following conditions: ? Inflammation of veins in the anus (hemorrhoids). ? Inflammation of specific areas of the digestive tract (uncomplicated diverticulosis). ? A problem of the large intestine, also called the colon, that sometimes causes pain and diarrhea (irritable bowel syndrome, or IBS).  Prevent overeating as part of a weight-loss plan.  Prevent heart disease, type 2 diabetes, and certain cancers. What are tips for following this plan? Reading food labels  Check the nutrition facts label on food products for the amount of dietary fiber. Choose foods that have 5 grams of fiber or more per serving.  The goals for recommended daily fiber intake include: ? Men (age 1 or younger): 34-38 g. ? Men (over age 82): 28-34 g. ? Women (age 43 or younger): 25-28 g. ? Women (over age 52): 22-25 g. Your daily fiber goal is _____________ g.   Shopping  Choose whole fruits and vegetables instead of processed forms, such as apple juice or applesauce.  Choose a wide variety of high-fiber foods such as avocados, lentils, oats, and kidney beans.  Read the nutrition facts label of the foods you choose. Be aware of foods with added fiber. These foods often have high sugar and sodium amounts per serving. Cooking  Use whole-grain flour for baking  and cooking.  Cook with brown rice instead of white rice. Meal planning  Start the day with a breakfast that is high in fiber, such as a cereal that contains 5 g of fiber or more per serving.  Eat breads and cereals that are made with whole-grain flour instead of refined flour or white flour.  Eat brown rice, bulgur wheat, or millet instead of white rice.  Use beans in place of  meat in soups, salads, and pasta dishes.  Be sure that half of the grains you eat each day are whole grains. General information  You can get the recommended daily intake of dietary fiber by: ? Eating a variety of fruits, vegetables, grains, nuts, and beans. ? Taking a fiber supplement if you are not able to take in enough fiber in your diet. It is better to get fiber through food than from a supplement.  Gradually increase how much fiber you consume. If you increase your intake of dietary fiber too quickly, you may have bloating, cramping, or gas.  Drink plenty of water to help you digest fiber.  Choose high-fiber snacks, such as berries, raw vegetables, nuts, and popcorn. What foods should I eat? Fruits Berries. Pears. Apples. Oranges. Avocado. Prunes and raisins. Dried figs. Vegetables Sweet potatoes. Spinach. Kale. Artichokes. Cabbage. Broccoli. Cauliflower. Green peas. Carrots. Squash. Grains Whole-grain breads. Multigrain cereal. Oats and oatmeal. Brown rice. Barley. Bulgur wheat. [place]. Quinoa. Bran muffins. Popcorn. Rye wafer crackers. Meats and other proteins Navy beans, kidney beans, and pinto beans. Soybeans. Split peas. Lentils. Nuts and seeds. Dairy Fiber-fortified yogurt. Beverages Fiber-fortified soy milk. Fiber-fortified orange juice. Other foods Fiber bars. The items listed above may not be a complete list of recommended foods and beverages. Contact a dietitian for more information. What foods should I avoid? Fruits Fruit juice. Cooked, strained fruit. Vegetables Fried potatoes. Canned vegetables. Well-cooked vegetables. Grains White bread. Pasta made with refined flour. White rice. Meats and other proteins Fatty cuts of meat. Fried chicken or fried fish. Dairy Milk. Yogurt. Cream cheese. Sour cream. Fats and oils Butters. Beverages Soft drinks. Other foods Cakes and pastries. The items listed above may not be a complete list of foods and beverages to  avoid. Talk with your dietitian about what choices are best for you. Summary  Fiber is a type of carbohydrate. It is found in foods such as fruits, vegetables, whole grains, and beans.  A high-fiber diet has many benefits. It can help to prevent constipation, lower blood cholesterol, aid weight loss, and reduce your risk of heart disease, diabetes, and certain cancers.  Increase your intake of fiber gradually. Increasing fiber too quickly may cause cramping, bloating, and gas. Drink plenty of water while you increase the amount of fiber you consume.  The best sources of fiber include whole fruits and vegetables, whole grains, nuts, seeds, and beans. This information is not intended to replace advice given to you by your health care provider. Make sure you discuss any questions you have with your health care provider. Document Revised: 03/26/2020 Document Reviewed: 03/26/2020 Elsevier Patient Education  2021 [employer].

## 2021-02-07 ENCOUNTER — Other Ambulatory Visit: Payer: Self-pay | Admitting: Student

## 2021-03-22 ENCOUNTER — Institutional Professional Consult (permissible substitution): Payer: Medicaid Other | Admitting: Pulmonary Disease

## 2021-04-05 ENCOUNTER — Other Ambulatory Visit: Payer: Medicaid Other

## 2021-04-16 ENCOUNTER — Telehealth: Payer: Self-pay | Admitting: Pulmonary Disease

## 2021-04-16 ENCOUNTER — Other Ambulatory Visit: Payer: Self-pay

## 2021-04-16 ENCOUNTER — Ambulatory Visit (INDEPENDENT_AMBULATORY_CARE_PROVIDER_SITE_OTHER): Payer: Medicaid Other | Admitting: Pulmonary Disease

## 2021-04-16 ENCOUNTER — Encounter: Payer: Self-pay | Admitting: Pulmonary Disease

## 2021-04-16 VITALS — BP 124/80 | HR 72 | Temp 97.3°F | Ht 71.0 in | Wt 243.4 lb

## 2021-04-16 DIAGNOSIS — R918 Other nonspecific abnormal finding of lung field: Secondary | ICD-10-CM

## 2021-04-16 DIAGNOSIS — R590 Localized enlarged lymph nodes: Secondary | ICD-10-CM

## 2021-04-16 DIAGNOSIS — R591 Generalized enlarged lymph nodes: Secondary | ICD-10-CM | POA: Diagnosis not present

## 2021-04-16 DIAGNOSIS — C3490 Malignant neoplasm of unspecified part of unspecified bronchus or lung: Secondary | ICD-10-CM

## 2021-04-16 NOTE — Addendum Note (Signed)
Addended by: Amado Coe on: 04/16/2021 03:26 PM   Modules accepted: Orders

## 2021-04-16 NOTE — Patient Instructions (Addendum)
Lymph nodes in the middle of the chest -We will repeat the CT scan to evaluate if the lymph nodes are better or about the same, this will also look at the spots in the lungs  Once we get the CT scan We will let you know about when we need to do the bronchoscopy as we discussed  Tentative follow-up in 4 to 6 weeks  Call us with significant concerns Flexible Bronchoscopy  Flexible bronchoscopy is a procedure used to examine the passageways in the lungs. During the procedure, a thin, flexible tool with a camera (bronchoscope) is passed into the mouth or nose, down through the windpipe (trachea), and into the air tubes in the lungs (bronchi). This tool allows the health care provider to look inside the lungs and to take samples for testing, if needed. Tell a health care provider about:  Any allergies you have.  All medicines you are taking, including vitamins, herbs, eye drops, creams, and over-the-counter medicines.  Any problems you or family members have had with anesthetic medicines.  Any blood disorders you have.  Any surgeries you have had.  Any medical conditions you have.  Whether you are pregnant or may be pregnant. What are the risks? Generally, this is a safe procedure. However, problems may occur, including:  Infection.  Bleeding.  Damage to other structures or organs.  Allergic reactions to medicines.  Collapsed lung (pneumothorax).  Increased need for oxygen or difficulty breathing after the procedure. What happens before the procedure? Staying hydrated Follow instructions from your health care provider about hydration, which may include:  Up to 2 hours before the procedure - you may continue to drink clear liquids, such as water, clear fruit juice, black coffee, and plain tea.   Eating and drinking restrictions Follow instructions from your health care provider about eating and drinking, which may include:  8 hours before the procedure - stop eating heavy  meals or foods, such as meat, fried foods, or fatty foods.  6 hours before the procedure - stop eating light meals or foods, such as toast or cereal.  6 hours before the procedure - stop drinking milk or drinks that contain milk.  2 hours before the procedure - stop drinking clear liquids. Medicines Ask your health care provider about:  Changing or stopping your regular medicines. This is especially important if you are taking diabetes medicines or blood thinners.  Taking medicines such as aspirin and ibuprofen. These medicines can thin your blood. Do not take these medicines unless your health care provider tells you to take them.  Taking over-the-counter medicines, vitamins, herbs, and supplements. General instructions  You may be given antibiotic medicine to help lower the risk of infection.  Plan to have a responsible adult take you home from the hospital or clinic.  If you will be going home right after the procedure, plan to have a responsible adult care for you for the time you are told. This is important. What happens during the procedure?  An IV will be inserted into one of your veins.  You will be given a medicine (local anesthetic) to numb your mouth, nose, throat, and voice box (larynx). You may also be given one or more of the following: ? A medicine to help you relax (sedative). ? A medicine to control coughing. ? A medicine to dry up any fluids or secretions in your lungs.  A bronchoscope will be passed into your nose or mouth, and into your lungs. Your health care provider  will examine your lungs.  Samples of airway secretions may be collected for testing.  If abnormal areas are seen in your airways, samples of tissue may be removed and checked under a microscope (biopsy).  If tissue samples are needed from the outer parts of the lung, a type of X-ray (fluoroscopy) may be used to guide the bronchoscope to these areas.  If bleeding occurs, you may be given  medicine to stop or decrease the bleeding. The procedure may vary among health care providers and hospitals. What can I expect after the procedure?  Your blood pressure, heart rate, breathing rate, and blood oxygen level will be monitored until you leave the hospital or clinic.  You may have a chest X-ray to check for signs of pneumothorax.  You willnot be allowed to eat or drink anything for 2 hours after your procedure.  If a biopsy was taken, it is up to you to get the results of the test. Ask your health care provider, or the department that is doing the procedure, when your results will be ready.  You may have the following symptoms for 24-48 hours: ? A cough that is worse than it was before the procedure. ? A low-grade fever. ? A sore throat or hoarse voice. ? Some blood in the mucus from your lungs (sputum), if a biopsy was done. Follow these instructions at home: Eating and drinking  Do not eat or drink anything, including water, for 2 hours after your procedure, or until your numbing medicine has worn off. Having a numb throat increases your risk of burning yourself or choking.  Start eating soft foods and slowly drinking liquids after your numbness is gone and your cough and gag reflexes have returned.  You may return to your normal diet the day after the procedure. Driving  If you were given a sedative during the procedure, it can affect you for several hours. Do not drive or operate machinery until your health care provider says that it is safe.  Ask your health care provider if the medicine prescribed to you requires you to avoid driving or using machinery.  Return to your normal activities as told by your health care provider. Ask your health care provider what activities are safe for you. General instructions  Take over-the-counter and prescription medicines only as told by your health care provider.  Do not use any products that contain nicotine or tobacco. These  products include cigarettes, chewing tobacco, and vaping devices, such as e-cigarettes. If you need help quitting, ask your health care provider.  Keep all follow-up visits. This is important.   Get help right away if:  You have shortness of breath that gets worse.  You become light-headed or feel like you might faint.  You have chest pain.  You cough up more than a small amount of blood. These symptoms may represent a serious problem that is an emergency. Do not wait to see if the symptoms will go away. Get medical help right away. Call your local emergency services (911 in the [place] Do not drive yourself to the hospital. Summary  Flexible bronchoscopy is a procedure that allows your health care provider to look closely inside your lungs and to take testing samples if needed.  Risks of flexible bronchoscopy include bleeding, infection, and collapsed lung (pneumothorax).  Before the procedure, you will be given a medicine to numb your mouth, nose, throat, and voice box. Then, a bronchoscope will be passed into your nose or mouth, and  into your lungs.  After the procedure, your blood pressure, heart rate, breathing rate, and blood oxygen level will be monitored until you leave the hospital or clinic. You may have a chest X-ray to check for signs of pneumothorax.  You will not be allowed to eat or drink anything for 2 hours after your procedure. This information is not intended to replace advice given to you by your health care provider. Make sure you discuss any questions you have with your health care provider. Document Revised: 06/11/2020 Document Reviewed: 06/11/2020 Elsevier Patient Education  [employer].

## 2021-04-16 NOTE — Progress Notes (Signed)
Jacob Gonzales    [id number]    [birth year]  Primary Care Physician:Lam, Jacob Rummage, NP  Referring Physician: Philmore Pali, NP [address] 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 amLODipine (NORVASC) 10 MG tablet Take 10 mg by mouth daily. for high blood pressure  . aspirin 81 MG chewable tablet Chew 1 tablet (81 mg total) by mouth daily.  Marland Kitchen atorvastatin (LIPITOR) 80 MG tablet Take 1 tablet (80 mg total) by mouth daily.  . carvedilol (COREG) 6.25 MG tablet Take 1 tablet (6.25 mg total) by mouth 2 (two) times daily with a meal.  . dapagliflozin propanediol (FARXIGA) 5 MG TABS tablet Take 1 tablet (5 mg total) by mouth daily.  Marland Kitchen HYDROcodone-acetaminophen (NORCO) 5-325 MG tablet Take 2 tablets by mouth every 4 (four) hours as needed.  Marland Kitchen lisinopril (ZESTRIL) 40 MG tablet Take 1 tablet (40 mg total) by mouth daily.  . nitroGLYCERIN (NITROSTAT) 0.4 MG SL tablet Place 1 tablet (0.4 mg total) under the tongue every 5 (five) minutes as needed for chest pain (CP or SOB).  . ticagrelor (BRILINTA) 90 MG TABS tablet Take 1 tablet (90 mg total) by mouth 2 (two) times daily.  . TRULICITY 1.5 XL/2.4MW SOPN SMARTSIG:0.5 Milliliter(s) SUB-Q Once a Week   No facility-administered encounter medications on file as of 04/16/2021.    Allergies as of 04/16/2021  . (No Known Allergies)     Past Medical History:  Diagnosis Date  . Acute arterial ischemic stroke, vertebrobasilar, thalamic ([place]) 11/17/2015  . Arthritis    Bilateral shoulders, Hands, Knees  . Bone cancer ([place]) 2016  . Brain cancer ([place]) 2016  . Diabetes ([place]) 11/17/2015  . HTN (hypertension) 11/17/2015  . NSTEMI (non-ST elevated myocardial infarction) [facility])     Past Surgical History:  Procedure Laterality Date  . CORONARY STENT INTERVENTION N/A 01/15/2021   Procedure: CORONARY STENT INTERVENTION;  Surgeon: Jettie Booze, MD;  Location: [facility];  Service: Cardiovascular;  Laterality: N/A;  . INTRAVASCULAR ULTRASOUND/IVUS N/A 01/15/2021   Procedure: Intravascular Ultrasound/IVUS;  Surgeon: Jettie Booze, MD;  Location: [facility];  Service: Cardiovascular;  Laterality: N/A;  . LEFT HEART CATH AND CORONARY ANGIOGRAPHY N/A 01/15/2021   Procedure: LEFT HEART CATH AND CORONARY ANGIOGRAPHY;  Surgeon: Jettie Booze, MD;  Location: [facility];  Service: Cardiovascular;  Laterality: N/A;  . NO PAST SURGERIES      Family History  Problem Relation Age of Onset  . Heart attack Mother   . Heart attack Father   . Diabetes Mellitus II Father   . Kidney failure Father   . Uterine cancer Sister   . Heart attack Brother   . Heart attack Maternal Aunt   . Heart attack Maternal Uncle   . Heart attack Paternal Aunt   .  Heart attack Paternal Uncle   . Stroke Neg Hx     Social History   Socioeconomic History  . Marital status: Married    Spouse name: Not on file  . Number of children: 3  . Years of education: Not on file  . Highest education level: Not on file  Occupational History  . Not on file  Tobacco Use  . Smoking status: Never Smoker  . Smokeless tobacco: Never Used  Vaping Use  . Vaping Use: Never used  Substance and Sexual Activity  . Alcohol use: No    Alcohol/week: 0.0 standard drinks  . Drug use: No  . Sexual activity: Not on file  Other  Topics Concern  . Not on file  Social History Narrative   Lives at home with wife   Caffeine use: Drinks soda (2-12packs per week)   Social Determinants of Health   Financial Resource Strain: Not on file  Food Insecurity: Not on file  Transportation Needs: Not on file  Physical Activity: Not on file  Stress: Not on file  Social Connections: Not on file  Intimate Partner Violence: Not on file    Review of Systems  Constitutional: Negative for activity change and fatigue.  Respiratory: Negative for cough and shortness of breath.   Psychiatric/Behavioral: Negative for sleep disturbance.    Vitals:   04/16/21 1015  BP: 124/80  Pulse: 72  Temp: (!) 97.3 F (36.3 C)  SpO2: 98%     Physical Exam Constitutional:      Appearance: He is obese.  HENT:     Head: Normocephalic and atraumatic.     Mouth/Throat:     Mouth: Mucous membranes are moist.  Eyes:     General:        Right eye: No discharge.        Left eye: No discharge.  Cardiovascular:     Rate and Rhythm: Normal rate and regular rhythm.     Heart sounds: No murmur heard. No friction rub.  Pulmonary:     Effort: No respiratory distress.     Breath sounds: No stridor. No wheezing or rhonchi.  Musculoskeletal:     Cervical back: No rigidity or tenderness.  Neurological:     Mental Status: He is alert.  Psychiatric:        Mood and Affect: Mood normal.    Data Reviewed: CT scan of the chest was reviewed with the patient with mediastinal adenopathy and lung nodules  Abdominal CT and head CT report noted  Assessment:  Multiple lung nodules  Mediastinal adenopathy  Concern for sarcoidosis, concern for lymphoma, concern for metastatic cancer discussed with patient  -Has no significant symptoms at present  Has been 3 months since his CT and there may be changes or evolution of findings -It is important to repeat a CT to look for this changes as this may guide intervention  History of coronary artery  disease-stable-post intervention  Plan/Recommendations: Schedule for CT of the chest with contrast for mediastinal adenopathy, lung nodules  Bronchoscopy discussed with the patient-will need an EBUS bronchoscopy  Risks with bronchoscopy discussed with the patient  Tentative follow-up in 4 to 6 weeks  CT scan will be done as soon as possible I spent 45 minutes dedicated to the care of this patient on the date of this encounter to include previsit review of records, face-to-face time with the patient discussing conditions above, post visit ordering of testing, clinical documentation with electronic health record and communicated  necessary findings to members of the patient's care team  Sherrilyn Rist MD [facility] 04/16/2021, 10:48 AM  CC: Jacob Pali, NP

## 2021-04-16 NOTE — Telephone Encounter (Signed)
Ct order changed to [facility]

## 2021-04-20 ENCOUNTER — Other Ambulatory Visit (INDEPENDENT_AMBULATORY_CARE_PROVIDER_SITE_OTHER): Payer: Medicaid Other

## 2021-04-20 DIAGNOSIS — R918 Other nonspecific abnormal finding of lung field: Secondary | ICD-10-CM | POA: Diagnosis not present

## 2021-04-20 DIAGNOSIS — R591 Generalized enlarged lymph nodes: Secondary | ICD-10-CM

## 2021-04-20 LAB — BASIC METABOLIC PANEL
BUN: 11 mg/dL (ref 6–23)
CO2: 28 mEq/L (ref 19–32)
Calcium: 8.6 mg/dL (ref 8.4–10.5)
Chloride: 103 mEq/L (ref 96–112)
Creatinine, Ser: 0.75 mg/dL (ref 0.40–1.50)
GFR: 96.74 mL/min (ref >60.00)
Glucose, Bld: 107 mg/dL — ABNORMAL HIGH (ref 70–99)
Potassium: 4 mEq/L (ref 3.5–5.1)
Sodium: 137 mEq/L (ref 135–145)

## 2021-04-23 ENCOUNTER — Ambulatory Visit (HOSPITAL_COMMUNITY): Payer: Medicaid Other

## 2021-04-30 ENCOUNTER — Other Ambulatory Visit: Payer: Self-pay

## 2021-04-30 ENCOUNTER — Encounter (HOSPITAL_COMMUNITY): Payer: Self-pay

## 2021-04-30 ENCOUNTER — Ambulatory Visit (HOSPITAL_COMMUNITY)
Admission: RE | Admit: 2021-04-30 | Discharge: 2021-04-30 | Disposition: A | Discharge status: Home / Self Care | Payer: Medicaid Other | Source: Ambulatory Visit | Attending: Pulmonary Disease | Admitting: Pulmonary Disease

## 2021-04-30 DIAGNOSIS — C3490 Malignant neoplasm of unspecified part of unspecified bronchus or lung: Secondary | ICD-10-CM | POA: Diagnosis present

## 2021-04-30 MED ORDER — IOHEXOL 300 MG/ML  SOLN
75.0000 mL | Freq: Once | INTRAMUSCULAR | Status: AC | PRN
  Administered 2021-04-30: 75 mL via INTRAVENOUS

## 2021-04-30 MED ORDER — SODIUM CHLORIDE (PF) 0.9 % IJ SOLN
INTRAMUSCULAR | Status: AC
  Filled 2021-04-30: qty 50

## 2021-05-10 ENCOUNTER — Telehealth: Payer: Self-pay | Admitting: Pulmonary Disease

## 2021-05-10 DIAGNOSIS — R590 Localized enlarged lymph nodes: Secondary | ICD-10-CM

## 2021-05-10 NOTE — Telephone Encounter (Signed)
Spoke with pt and advised that Dr Ander Slade is out of the office today but I will send him a  Message regarding CT results for when he is back in the office tomorrow. Pt verbalized understanding.  Dr Ander Slade, please advise on CT results.

## 2021-05-11 NOTE — Telephone Encounter (Signed)
Pt returning a phone call. Pt can be reached at [phone]

## 2021-05-11 NOTE — Telephone Encounter (Signed)
I called and spoke with patient regarding message. Patient was inquiring to see if Dr. Ander Slade has reviewed his CT yet. It doesn't look like he has so I informed patient I will message Dr. Ander Slade to look at it as he is in the office tomorrow. Patient verbalized understanding, nothing further needed.   Dr. Ander Slade, please advise on CT. Thanks!

## 2021-05-12 NOTE — Telephone Encounter (Signed)
LMTCB for the pt  CT chest for 6 months is pending

## 2021-05-12 NOTE — Telephone Encounter (Signed)
Spoke with the pt and notified of results/recs  He verbalized understanding  Nothing further needed CT order placed for 6 mo

## 2021-05-12 NOTE — Telephone Encounter (Signed)
Reviewed patients CT scan  Improvement in adenopathy Still has some subpleural nodules that need followed up  Repeat CT in about 6 months will be appropriate  No invasive intervention needed at present

## 2021-05-20 ENCOUNTER — Encounter: Payer: Self-pay | Admitting: Pulmonary Disease

## 2021-05-20 ENCOUNTER — Ambulatory Visit (INDEPENDENT_AMBULATORY_CARE_PROVIDER_SITE_OTHER): Payer: Medicaid Other | Admitting: Pulmonary Disease

## 2021-05-20 ENCOUNTER — Other Ambulatory Visit: Payer: Self-pay

## 2021-05-20 VITALS — BP 122/84 | HR 70 | Temp 98.4°F | Ht 70.0 in | Wt 245.7 lb

## 2021-05-20 DIAGNOSIS — R918 Other nonspecific abnormal finding of lung field: Secondary | ICD-10-CM

## 2021-05-20 DIAGNOSIS — R591 Generalized enlarged lymph nodes: Secondary | ICD-10-CM

## 2021-05-20 NOTE — Patient Instructions (Signed)
The lymph nodes in the middle of the chest are much smaller than what they were before -There is nothing that we will put you in harm's way that usually gets better by itself -Likely related to an infection/inflammation  Repeat CT scan of the chest with contrast in 1 year  Call with significant concerns  I will see you a year from now

## 2021-05-20 NOTE — Progress Notes (Signed)
Jacob Gonzales    [id number]    1956/11/21  Primary Care Physician:Lam, Rudi Rummage, NP  Referring Physician: Philmore Pali, NP [address] 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 for high blood pressure   aspirin 81 MG chewable tablet Chew 1 tablet (81 mg total) by mouth daily.   atorvastatin (LIPITOR) 80 MG tablet Take 1 tablet (80 mg total) by mouth daily.   carvedilol (COREG) 6.25 MG tablet Take 1 tablet (6.25 mg total) by mouth 2 (two) times daily with a meal.   dapagliflozin propanediol (FARXIGA) 5 MG TABS tablet Take 1 tablet (5 mg total) by mouth daily.   HYDROcodone-acetaminophen (NORCO) 5-325 MG tablet Take 2 tablets by mouth every 4 (four) hours as needed.   lisinopril (ZESTRIL) 40 MG tablet Take 1 tablet (40 mg total) by mouth daily.   nitroGLYCERIN (NITROSTAT) 0.4 MG SL tablet Place 1 tablet (0.4 mg total) under the tongue every 5 (five) minutes as needed for chest pain (CP or SOB).   ticagrelor (BRILINTA) 90 MG TABS tablet Take 1 tablet (90 mg total) by mouth 2 (two)  times daily.   TRULICITY 1.5 GB/2.0FE SOPN SMARTSIG:0.5 Milliliter(s) SUB-Q Once a Week   No facility-administered encounter medications on file as of 05/20/2021.    Allergies as of 05/20/2021   (No Known Allergies)    Past Medical History:  Diagnosis Date   Acute arterial ischemic stroke, vertebrobasilar, thalamic ([place]) 11/17/2015   Arthritis    Bilateral shoulders, Hands, Knees   Bone cancer ([place]) 2016   Brain cancer ([place]) 2016   Diabetes ([place]) 11/17/2015   HTN (hypertension) 11/17/2015   NSTEMI (non-ST elevated myocardial infarction) [facility])     Past Surgical History:  Procedure Laterality Date   CORONARY STENT INTERVENTION N/A 01/15/2021   Procedure: CORONARY STENT INTERVENTION;  Surgeon: Jettie Booze, MD;  Location: [facility];  Service: Cardiovascular;  Laterality: N/A;   INTRAVASCULAR ULTRASOUND/IVUS N/A 01/15/2021   Procedure: Intravascular Ultrasound/IVUS;  Surgeon: Jettie Booze, MD;  Location: [facility];  Service: Cardiovascular;  Laterality: N/A;   LEFT HEART CATH AND CORONARY ANGIOGRAPHY N/A 01/15/2021   Procedure: LEFT HEART CATH AND CORONARY ANGIOGRAPHY;  Surgeon: Jettie Booze, MD;  Location: [facility];  Service: Cardiovascular;  Laterality: N/A;   NO PAST SURGERIES      Family History  Problem Relation Age of Onset   Heart attack Mother    Heart attack Father    Diabetes Mellitus II Father    Kidney failure Father  Uterine cancer Sister    Heart attack Brother    Heart attack Maternal Aunt    Heart attack Maternal Uncle    Heart attack Paternal Aunt    Heart attack Paternal Uncle    Stroke Neg Hx     Social History   Socioeconomic History   Marital status: Married    Spouse name: Not on file   Number of children: 3   Years of education: Not on file   Highest education level: Not on file  Occupational History   Not on file  Tobacco Use   Smoking status: Never   Smokeless tobacco: Never  Vaping Use    Vaping Use: Never used  Substance and Sexual Activity   Alcohol use: No    Alcohol/week: 0.0 standard drinks   Drug use: No   Sexual activity: Not on file  Other Topics Concern   Not on file  Social History Narrative   Lives at home with wife   Caffeine use: Drinks soda (2-12packs per week)   Social Determinants of Health   Financial Resource Strain: Not on file  Food Insecurity: Not on file  Transportation Needs: Not on file  Physical Activity: Not on file  Stress: Not on file  Social Connections: Not on file  Intimate Partner Violence: Not on file    Review of Systems  Constitutional:  Negative for activity change and fatigue.  Respiratory:  Negative for cough and shortness of breath.   Psychiatric/Behavioral:  Negative for sleep disturbance.    Vitals:   05/20/21 1020  BP: 122/84  Pulse: 70  Temp: 98.4 F (36.9 C)  SpO2: 98%     Physical Exam Constitutional:      Appearance: He is obese.  HENT:     Head: Normocephalic and atraumatic.     Mouth/Throat:     Mouth: Mucous membranes are moist.  Eyes:     General:        Right eye: No discharge.        Left eye: No discharge.  Cardiovascular:     Rate and Rhythm: Normal rate and regular rhythm.     Heart sounds: No murmur heard.   No friction rub.  Pulmonary:     Effort: No respiratory distress.     Breath sounds: No stridor. No wheezing or rhonchi.  Musculoskeletal:     Cervical back: No rigidity or tenderness.  Neurological:     Mental Status: He is alert.  Psychiatric:        Mood and Affect: Mood normal.   Data Reviewed: CT scan of the chest was reviewed with the patient with mediastinal adenopathy and lung nodules-improvement in nodule size this Reviewed with patient and his son-CT films was reviewed and compared with previous  Assessment:  Subpleural nodules, mediastinal adenopathy  Adenopathy is significantly improved  The concern for lymphoma, sarcoidosis, metastatic cancer is  significantly less at present  He has no significant symptoms Has no weight loss or significant concerns   Follow-up in about a year is appropriate  History of coronary artery disease-stable-post intervention  Plan/Recommendations:  Repeat CT scan of the chest in a year to follow-up on adenopathy  Encouraged to call with any significant concerns  No indication for invasive intervention  Follow-up in a year  CT scan will be done as soon as possible I spent 30 minutes dedicated to the care of this patient on the date of this encounter to include previsit review of records, face-to-face  time with the patient discussing conditions above, post visit ordering of testing, clinical documentation with electronic health record and communicated necessary findings to members of the patient's care team  Sherrilyn Rist MD [facility] 05/20/2021, 10:29 AM  CC: Philmore Pali, NP

## 2021-08-17 NOTE — Progress Notes (Deleted)
Cardiology Office Note   Date:  08/17/2021   ID:  Jacob Gonzales, DOB 05/04/1956, MRN [id number]  PCP:  Jacob Pali, NP    No chief complaint on file.  CAD  Wt Readings from Last 3 Encounters:  05/20/21 245 lb 11.2 oz (111.4 kg)  04/16/21 243 lb 6.4 oz (110.4 kg)  02/04/21 252 lb 12.8 oz (114.7 kg)       History of Present Illness: Jacob Gonzales is a 65 y.o. male  Who had an NSTEMI in 2/22.  Angina was a severe pain in the center of chest.  He had : "Elevated troponin w/ new RBB on ECG. Cardiology performed LHC w/ DES to OM2. Patient asymptomatic and stable since procedure. Will need to continue DAPT for at least 30 days.   Cath showed: "Dist RCA lesion is 25% stenosed. RPDA lesion is 25% stenosed. Mid LM to Dist LM lesion is 25% stenosed. 2nd Diag lesion is 25% stenosed. Ost LAD to Prox LAD lesion is 25% stenosed. Mid LAD-1 lesion is 50% stenosed. Mid LAD-2 lesion is 25% stenosed. Ost Cx to Prox Cx lesion is 25% stenosed. 2nd Mrg lesion is 99% stenosed. A drug-eluting stent was successfully placed using a STENT RESOLUTE ONYX 3.0X22, postdilated to 3.75 in the proximal and mid stent, optimized with IVUS. Post intervention, there is a 0% residual stenosis. The left ventricular ejection fraction is 45-50% by visual estimate. There is mild left ventricular systolic dysfunction. LV end diastolic pressure is normal. There is no aortic valve stenosis.   Continue dual antiplatelet therapy with aspirin and Brilinta, ideally for 1 year.   High dose statin.    Given the pulmonary nodules, may need to stop DAPT earlier, but minimum will be 30 days.     Continue aggressive secondary prevention.  Follow renal function to determine if further CT scans can be done."      Pulmonary nodules with adenopathy noted On CTA chest, found to have multiple pulmonary nodules with adenopathy. Further imaging of head, abdomen, and pelvis did not reveal obvious malignancy. Will need further work-up  with PCP and possibly future referral to pulmonology for biopsy. Previously ANA positive per PCP w/ rheumatology visit, appears lost to follow-up.   Acute combined systolic, diastolic heart failure: TTE during admission revealed LVEF 40-45% w/ grade I diastolic heart failure. Optimizing on GDMT, adjust as needed.   Hypertension: Markedly elevated BP on arrival to ED. Titrate meds as needed.   Type II diabetes mellitus: A1c 6.8 on arrival. Patient to continue home Trulicity. Also adding Farxiga to regimen. Discussed side effects of Farxiga with patient.   Hyperlipidemia: High dose statin started. "   Scans in the hospital were negative for malignancy.  H/o positive ANA in the past.  CVA in the past.   Past Medical History:  Diagnosis Date   Acute arterial ischemic stroke, vertebrobasilar, thalamic ([place]) 11/17/2015   Arthritis    Bilateral shoulders, Hands, Knees   Bone cancer ([place]) 2016   Brain cancer ([place]) 2016   Diabetes ([place]) 11/17/2015   HTN (hypertension) 11/17/2015   NSTEMI (non-ST elevated myocardial infarction) [facility])     Past Surgical History:  Procedure Laterality Date   CORONARY STENT INTERVENTION N/A 01/15/2021   Procedure: CORONARY STENT INTERVENTION;  Surgeon: Jettie Booze, MD;  Location: [facility];  Service: Cardiovascular;  Laterality: N/A;   INTRAVASCULAR ULTRASOUND/IVUS N/A 01/15/2021   Procedure: Intravascular Ultrasound/IVUS;  Surgeon: Jettie Booze, MD;  Location: [facility]  INVASIVE [facility];  Service: Cardiovascular;  Laterality: N/A;   LEFT HEART CATH AND CORONARY ANGIOGRAPHY N/A 01/15/2021   Procedure: LEFT HEART CATH AND CORONARY ANGIOGRAPHY;  Surgeon: Jettie Booze, MD;  Location: [facility];  Service: Cardiovascular;  Laterality: N/A;   NO PAST SURGERIES       Current Outpatient Medications  Medication Sig Dispense Refill   amLODipine (NORVASC) 10 MG tablet Take 10 mg by mouth daily. for high blood pressure     aspirin 81 MG  chewable tablet Chew 1 tablet (81 mg total) by mouth daily. 30 tablet 3   atorvastatin (LIPITOR) 80 MG tablet Take 1 tablet (80 mg total) by mouth daily. 30 tablet 0   carvedilol (COREG) 6.25 MG tablet Take 1 tablet (6.25 mg total) by mouth 2 (two) times daily with a meal. 60 tablet 0   dapagliflozin propanediol (FARXIGA) 5 MG TABS tablet Take 1 tablet (5 mg total) by mouth daily. 30 tablet 0   HYDROcodone-acetaminophen (NORCO) 5-325 MG tablet Take 2 tablets by mouth every 4 (four) hours as needed. 10 tablet 0   lisinopril (ZESTRIL) 40 MG tablet Take 1 tablet (40 mg total) by mouth daily. 30 tablet 0   nitroGLYCERIN (NITROSTAT) 0.4 MG SL tablet Place 1 tablet (0.4 mg total) under the tongue every 5 (five) minutes as needed for chest pain (CP or SOB). 10 tablet 0   ticagrelor (BRILINTA) 90 MG TABS tablet Take 1 tablet (90 mg total) by mouth 2 (two) times daily. 951 tablet 3   TRULICITY 1.5 OA/4.1YS SOPN SMARTSIG:0.5 Milliliter(s) SUB-Q Once a Week     No current facility-administered medications for this visit.    Allergies:   Patient has no known allergies.    Social History:  The patient  reports that he has never smoked. He has never used smokeless tobacco. He reports that he does not drink alcohol and does not use drugs.   Family History:  The patient's ***family history includes Diabetes Mellitus II in his father; Heart attack in his brother, father, maternal aunt, maternal uncle, mother, paternal aunt, and paternal uncle; Kidney failure in his father; Uterine cancer in his sister.    ROS:  Please see the history of present illness.   Otherwise, review of systems are positive for ***.   All other systems are reviewed and negative.    PHYSICAL EXAM: VS:  There were no vitals taken for this visit. , BMI There is no height or weight on file to calculate BMI. GEN: Well nourished, well developed, in no acute distress HEENT: normal Neck: no JVD, carotid bruits, or masses Cardiac: ***RRR;  no murmurs, rubs, or gallops,no edema  Respiratory:  clear to auscultation bilaterally, normal work of breathing GI: soft, nontender, nondistended, + BS MS: no deformity or atrophy Skin: warm and dry, no rash Neuro:  Strength and sensation are intact Psych: euthymic mood, full affect   EKG:   The ekg ordered today demonstrates ***   Recent Labs: 01/15/2021: Magnesium 2.0; TSH 0.930 01/16/2021: ALT 34; Hemoglobin 13.9; Platelets 359 04/20/2021: BUN 11; Creatinine, Ser 0.75; Potassium 4.0; Sodium 137   Lipid Panel    Component Value Date/Time   CHOL 205 (H) 01/15/2021 1155   TRIG 101 01/15/2021 1155   HDL 27 (L) 01/15/2021 1155   CHOLHDL 7.6 01/15/2021 1155   VLDL 20 01/15/2021 1155   LDLCALC 158 (H) 01/15/2021 1155     Other studies Reviewed: Additional studies/ records that were reviewed today  with results demonstrating: ***.   ASSESSMENT AND PLAN:  CAD/Old MI:  Hyperlipidemia: HTN: DM: Obesity:   Current medicines are reviewed at length with the patient today.  The patient concerns regarding his medicines were addressed.  The following changes have been made:  No change***  Labs/ tests ordered today include: *** No orders of the defined types were placed in this encounter.   Recommend 150 minutes/week of aerobic exercise Low fat, low carb, high fiber diet recommended  Disposition:   FU in ***   Signed, Larae Grooms, MD  08/17/2021 1:08 PM    [facility] [place], [place], [place]  [address] Phone: [phone]; Fax: [phone]

## 2021-08-18 ENCOUNTER — Ambulatory Visit: Payer: Medicaid Other | Admitting: Interventional Cardiology

## 2021-08-18 DIAGNOSIS — I252 Old myocardial infarction: Secondary | ICD-10-CM

## 2021-08-18 DIAGNOSIS — I2510 Atherosclerotic heart disease of native coronary artery without angina pectoris: Secondary | ICD-10-CM

## 2021-08-18 DIAGNOSIS — E1159 Type 2 diabetes mellitus with other circulatory complications: Secondary | ICD-10-CM

## 2021-08-18 DIAGNOSIS — E782 Mixed hyperlipidemia: Secondary | ICD-10-CM

## 2021-12-25 ENCOUNTER — Ambulatory Visit
Admission: EM | Admit: 2021-12-25 | Discharge: 2021-12-25 | Disposition: A | Discharge status: Home / Self Care | Payer: Medicaid Other

## 2022-01-03 ENCOUNTER — Emergency Department (HOSPITAL_COMMUNITY)
Admission: EM | Admit: 2022-01-03 | Discharge: 2022-01-03 | Disposition: A | Discharge status: Home / Self Care | Payer: Medicaid Other | Attending: Emergency Medicine | Admitting: Emergency Medicine

## 2022-01-03 ENCOUNTER — Emergency Department (HOSPITAL_COMMUNITY): Payer: Medicaid Other

## 2022-01-03 ENCOUNTER — Other Ambulatory Visit: Payer: Self-pay

## 2022-01-03 ENCOUNTER — Encounter (HOSPITAL_COMMUNITY): Payer: Self-pay | Admitting: Emergency Medicine

## 2022-01-03 DIAGNOSIS — R42 Dizziness and giddiness: Secondary | ICD-10-CM | POA: Diagnosis not present

## 2022-01-03 DIAGNOSIS — R410 Disorientation, unspecified: Secondary | ICD-10-CM | POA: Diagnosis not present

## 2022-01-03 DIAGNOSIS — R55 Syncope and collapse: Secondary | ICD-10-CM | POA: Insufficient documentation

## 2022-01-03 DIAGNOSIS — Z5321 Procedure and treatment not carried out due to patient leaving prior to being seen by health care provider: Secondary | ICD-10-CM | POA: Diagnosis not present

## 2022-01-03 DIAGNOSIS — E119 Type 2 diabetes mellitus without complications: Secondary | ICD-10-CM | POA: Insufficient documentation

## 2022-01-03 LAB — CBC
HCT: 45.9 % (ref 39.0–52.0)
Hemoglobin: 16.2 g/dL (ref 13.0–17.0)
MCH: 31.1 pg (ref 26.0–34.0)
MCHC: 35.3 g/dL (ref 30.0–36.0)
MCV: 88.1 fL (ref 80.0–100.0)
Platelets: 253 10*3/uL (ref 150–400)
RBC: 5.21 MIL/uL (ref 4.22–5.81)
RDW: 13.2 % (ref 11.5–15.5)
WBC: 11 10*3/uL — ABNORMAL HIGH (ref 4.0–10.5)
nRBC: 0 % (ref 0.0–0.2)

## 2022-01-03 LAB — ETHANOL: Alcohol, Ethyl (B): 164 mg/dL — ABNORMAL HIGH (ref <10)

## 2022-01-03 LAB — BASIC METABOLIC PANEL
Anion gap: 10 (ref 5–15)
BUN: 8 mg/dL (ref 8–23)
CO2: 22 mmol/L (ref 22–32)
Calcium: 8.7 mg/dL — ABNORMAL LOW (ref 8.9–10.3)
Chloride: 104 mmol/L (ref 98–111)
Creatinine, Ser: 0.83 mg/dL (ref 0.61–1.24)
GFR, Estimated: >60 mL/min (ref >60)
Glucose, Bld: 99 mg/dL (ref 70–99)
Potassium: 3.8 mmol/L (ref 3.5–5.1)
Sodium: 136 mmol/L (ref 135–145)

## 2022-01-03 NOTE — ED Provider Triage Note (Signed)
Emergency Medicine Provider Triage Evaluation Note  Jacob Gonzales , a 66 y.o. male  was evaluated in triage.  Pt complains of syncope.  Patient states that he woke up on the ground outside with his dog licking his face, unable to give me a clear timeline.  Son at bedside states he believes this was around 1 PM.  Patient does have a history of stroke, and his initial symptoms were syncopal episodes.  He does have residual left-sided deficits after the stroke.  Patient was noted to have had multiple beers this morning.  Patient denies daily alcohol use. Hx CVA, diabetes  Review of Systems  Positive: Confusion, weakness, syncope, dizziness Negative: Chest pain, shortness of breath  Physical Exam  BP 106/67    Pulse 88    Temp 98.2 F (36.8 C) (Oral)    Resp 17    Ht 5\' 10"  (1.778 m)    Wt 111.1 kg    SpO2 97%    BMI 35.15 kg/m  Gen:   Awake, no distress   Resp:  Normal effort  MSK:   Moves extremities without difficulty  Other:  Alert and oriented in triage, 4/5 grip strength in the left compared to the right, otherwise 5/5 strength in all extremities, no slurred speech or facial droop, no pronator drift  Patient tearful in triage about the loss of his wife 2 years ago  Medical Decision Making  Medically screening exam initiated at 4:32 PM.  Appropriate orders placed.  Jacob Gonzales was informed that the remainder of the evaluation will be completed by another provider, this initial triage assessment does not replace that evaluation, and the importance of remaining in the ED until their evaluation is complete.  Will obtain labs and imaging   Jacob Plummer, PA-C 01/03/22 1636

## 2022-01-03 NOTE — ED Notes (Signed)
Pt stated that he cant wait here all night, and said he is leaving.

## 2022-01-03 NOTE — ED Notes (Signed)
Patient smells of alcohol and reports drinking 3 beers today.

## 2022-01-03 NOTE — ED Triage Notes (Signed)
Patient brought in by son saying the patient had a syncopal episode about an hour ago. States he was working in the yard and walking then remembers waking up to the dog licking his face. Reports an episode of dizziness this morning. Son states pt was disorientated when he first got to him.

## 2022-03-31 ENCOUNTER — Telehealth: Payer: Self-pay | Admitting: Pulmonary Disease

## 2022-03-31 DIAGNOSIS — R918 Other nonspecific abnormal finding of lung field: Secondary | ICD-10-CM

## 2022-04-01 NOTE — Telephone Encounter (Signed)
BMET ordered.  ?

## 2022-04-07 NOTE — Telephone Encounter (Signed)
Noted  

## 2022-05-16 ENCOUNTER — Ambulatory Visit (HOSPITAL_COMMUNITY): Payer: Medicaid Other

## 2022-08-23 IMAGING — CT CT HEAD W/O CM
4 series · 16 of 47 positions shown, 18 images · non-contrast
Comparison: 01/16/2021

CLINICAL DATA: Trauma, altered mental status



[Series 3: head wo · axial · 0.48mm/px · z∈[-172,-52]mm · 7 of 33 slices shown, 9 images]
[im 5/33  brain]
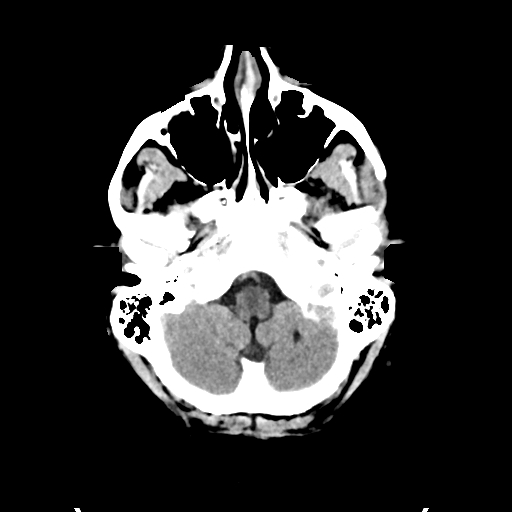
[im 5/33  bone]
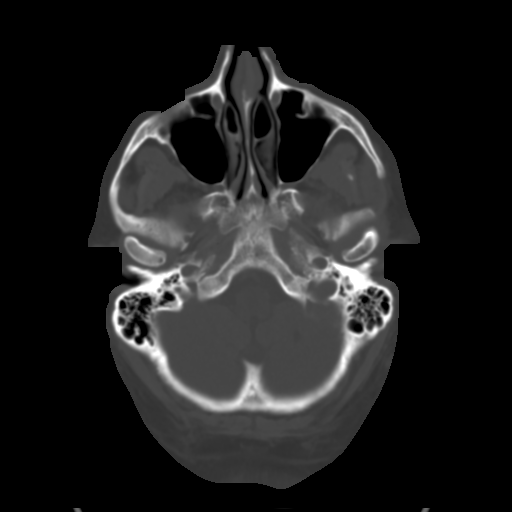
[im 9/33  brain]
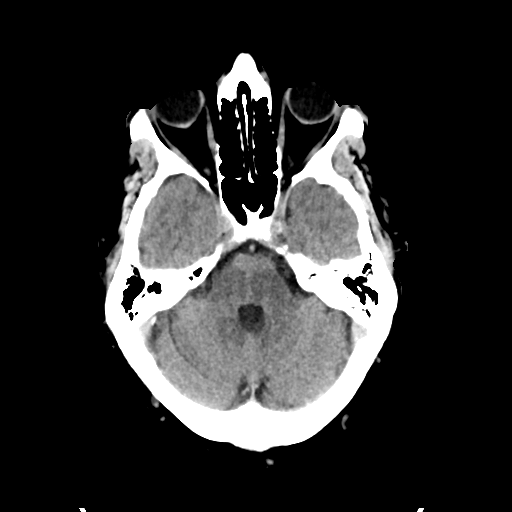
[im 13/33  brain]
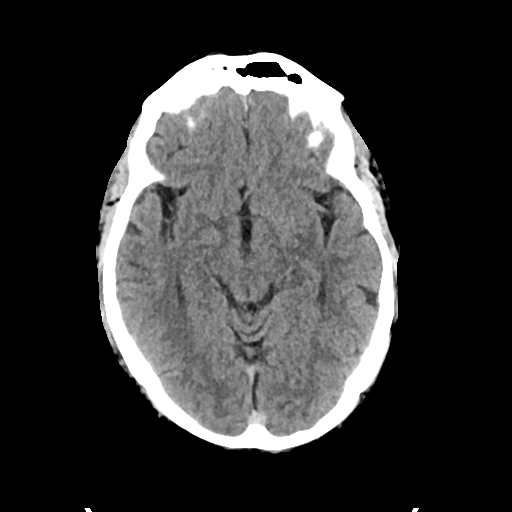
[im 17/33  brain]
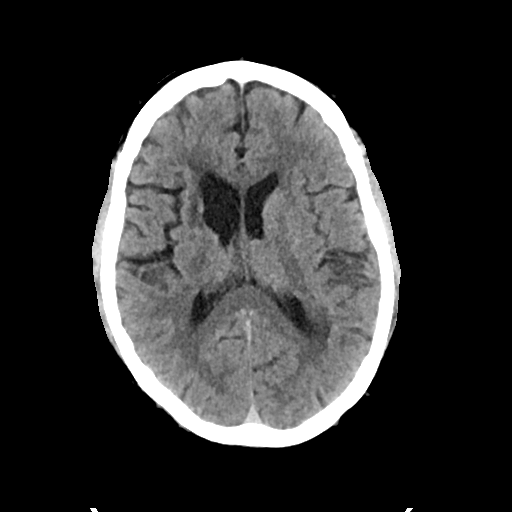
[im 21/33  brain]
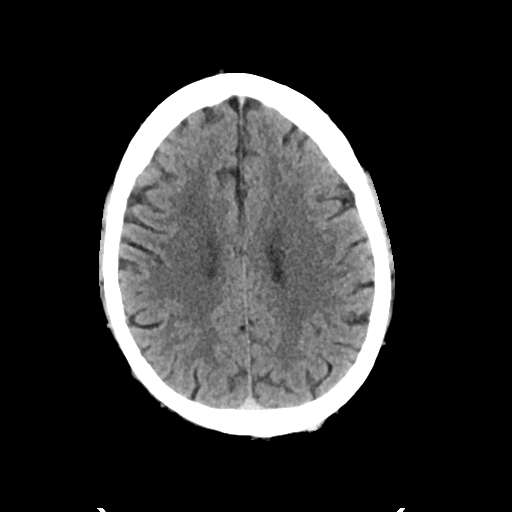
[im 21/33  bone]
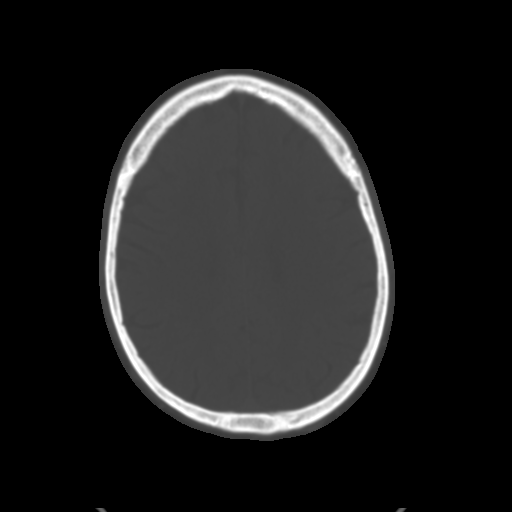
[im 25/33  brain]
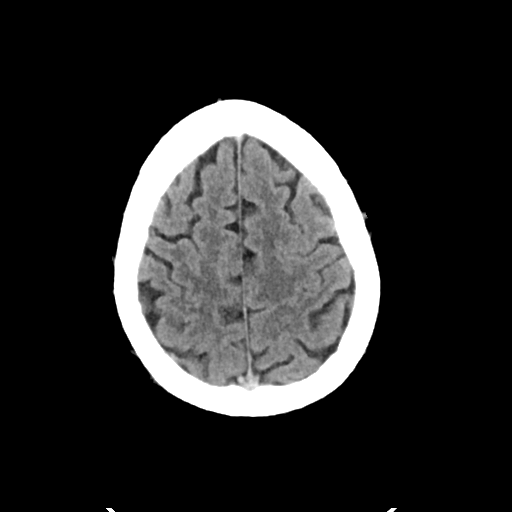
[im 29/33  brain]
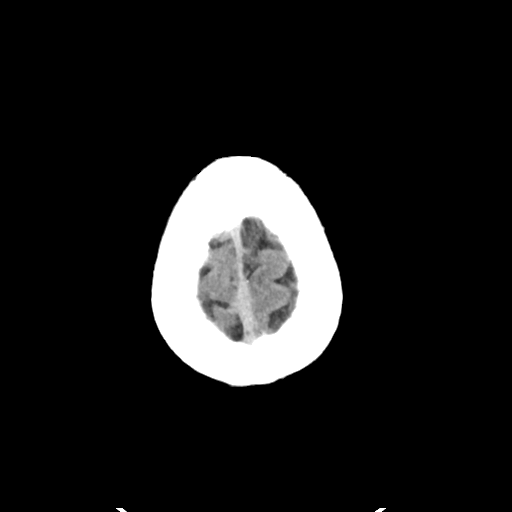

[Series 4: head bone · axial · 0.48mm/px · z∈[-176,-144]mm · 3 of 82 slices shown]
[im 9/82  bone]
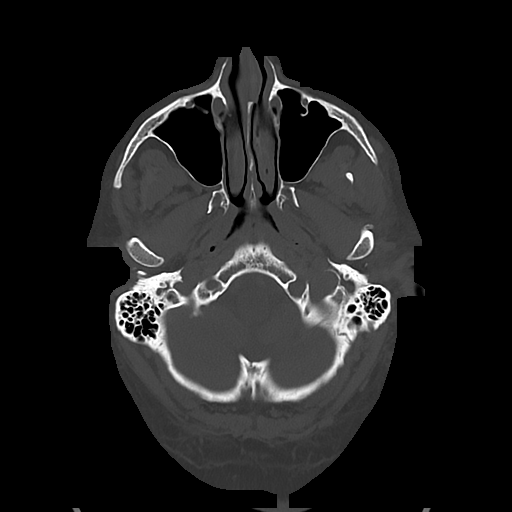
[im 17/82  bone]
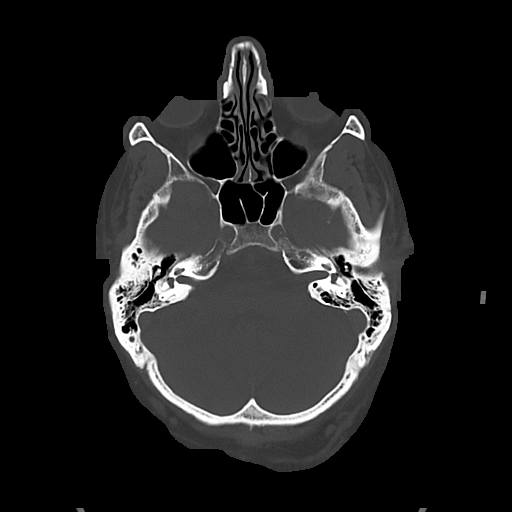
[im 25/82  bone]
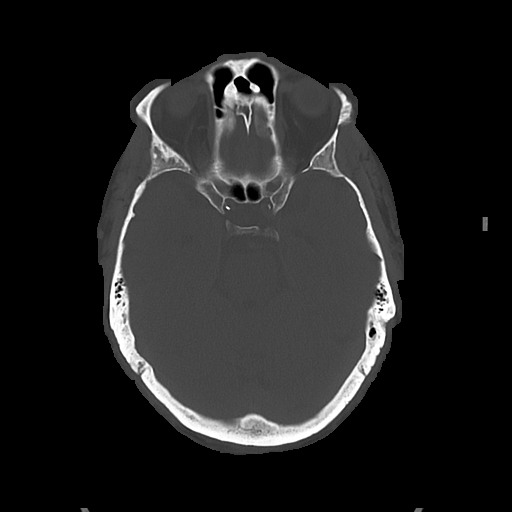

[Series 5: cor soft · coronal · 0.34mm/px · 3 of 69 slices shown]
[im 23/69  brain]
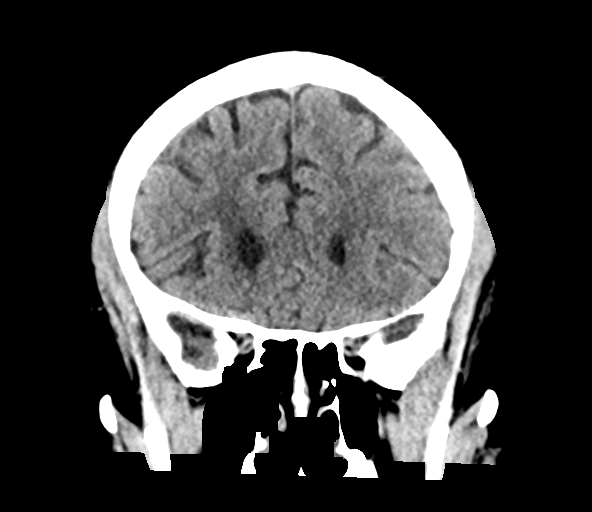
[im 31/69  brain]
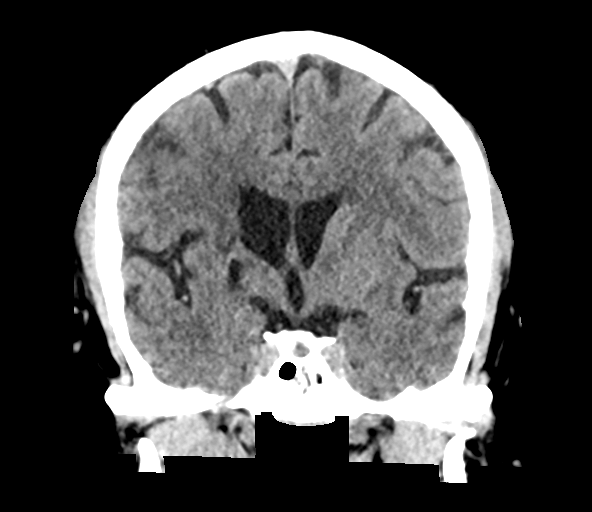
[im 38/69  brain]
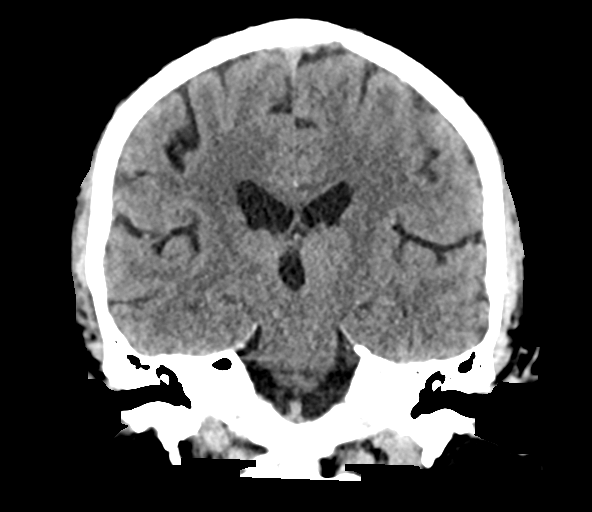

[Series 6: sag soft · sagittal · 0.34mm/px · 3 of 58 slices shown]
[im 20/58  brain]
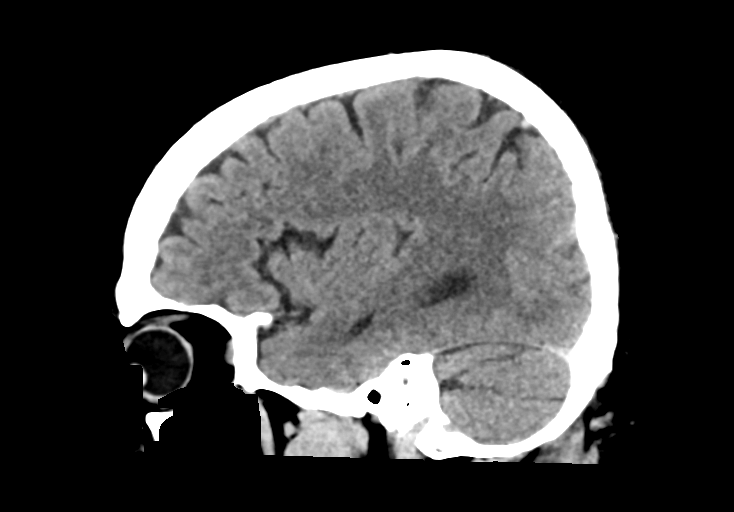
[im 29/58  brain]
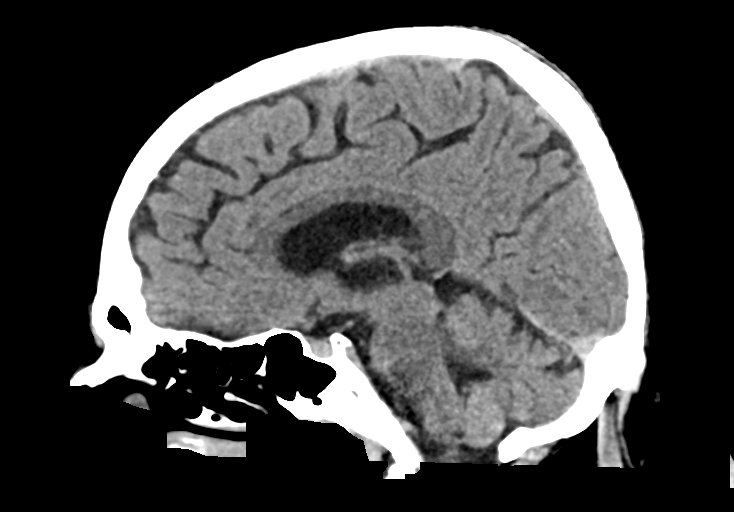
[im 39/58  brain]
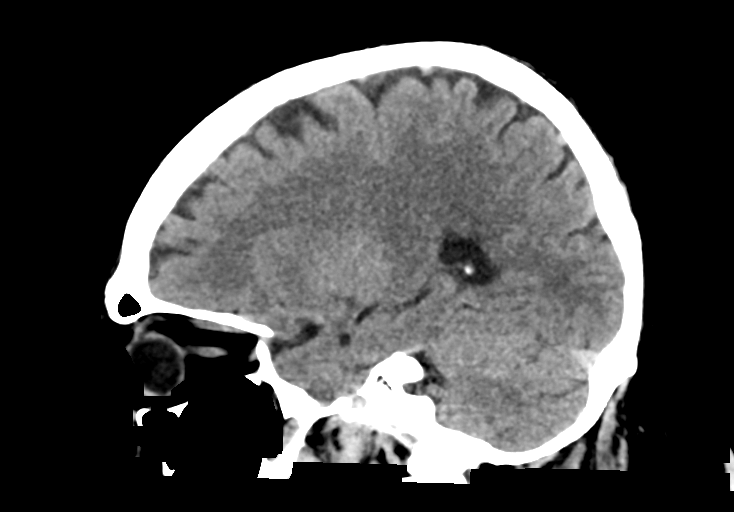

[16 of 47 positions shown; findings below may reference images not displayed]

FINDINGS: Brain: No acute intracranial findings are seen. Cortical sulci are
prominent. There is ex vacuo dilation of right lateral ventricle.
There are old infarcts in right basal ganglia and periventricular
regions in the frontal lobe. There is possible 7 mm old lacunar
infarct in the left cerebellum.

Vascular: Unremarkable.

Skull: Unremarkable.

Sinuses/Orbits: Unremarkable.

Other: No significant interval changes are noted.
IMPRESSION: No acute intracranial findings are seen.

Chronic findings as described in the body of the report with no
significant interval change.

## 2023-02-07 ENCOUNTER — Other Ambulatory Visit: Payer: Self-pay

## 2023-02-07 ENCOUNTER — Encounter (HOSPITAL_COMMUNITY): Payer: Self-pay

## 2023-02-07 ENCOUNTER — Emergency Department (HOSPITAL_COMMUNITY)
Admission: EM | Admit: 2023-02-07 | Discharge: 2023-02-07 | Disposition: A | Discharge status: Home / Self Care | Payer: Medicaid Other | Attending: Emergency Medicine | Admitting: Emergency Medicine

## 2023-02-07 ENCOUNTER — Emergency Department (HOSPITAL_COMMUNITY): Payer: Medicaid Other

## 2023-02-07 DIAGNOSIS — I10 Essential (primary) hypertension: Secondary | ICD-10-CM | POA: Diagnosis not present

## 2023-02-07 DIAGNOSIS — R739 Hyperglycemia, unspecified: Secondary | ICD-10-CM

## 2023-02-07 DIAGNOSIS — E1165 Type 2 diabetes mellitus with hyperglycemia: Secondary | ICD-10-CM | POA: Insufficient documentation

## 2023-02-07 DIAGNOSIS — Y9279 Other farm location as the place of occurrence of the external cause: Secondary | ICD-10-CM | POA: Insufficient documentation

## 2023-02-07 DIAGNOSIS — W01198A Fall on same level from slipping, tripping and stumbling with subsequent striking against other object, initial encounter: Secondary | ICD-10-CM | POA: Insufficient documentation

## 2023-02-07 DIAGNOSIS — Z7982 Long term (current) use of aspirin: Secondary | ICD-10-CM | POA: Insufficient documentation

## 2023-02-07 DIAGNOSIS — R0789 Other chest pain: Secondary | ICD-10-CM | POA: Insufficient documentation

## 2023-02-07 DIAGNOSIS — Z79899 Other long term (current) drug therapy: Secondary | ICD-10-CM | POA: Diagnosis not present

## 2023-02-07 LAB — CBG MONITORING, ED: Glucose-Capillary: 476 mg/dL — ABNORMAL HIGH (ref 70–99)

## 2023-02-07 LAB — CBC
HCT: 45.6 % (ref 39.0–52.0)
Hemoglobin: 15.8 g/dL (ref 13.0–17.0)
MCH: 30.1 pg (ref 26.0–34.0)
MCHC: 34.6 g/dL (ref 30.0–36.0)
MCV: 86.9 fL (ref 80.0–100.0)
Platelets: 280 10*3/uL (ref 150–400)
RBC: 5.25 MIL/uL (ref 4.22–5.81)
RDW: 13.4 % (ref 11.5–15.5)
WBC: 11.9 10*3/uL — ABNORMAL HIGH (ref 4.0–10.5)
nRBC: 0 % (ref 0.0–0.2)

## 2023-02-07 LAB — BASIC METABOLIC PANEL
Anion gap: 12 (ref 5–15)
BUN: 8 mg/dL (ref 8–23)
CO2: 22 mmol/L (ref 22–32)
Calcium: 8.5 mg/dL — ABNORMAL LOW (ref 8.9–10.3)
Chloride: 97 mmol/L — ABNORMAL LOW (ref 98–111)
Creatinine, Ser: 0.88 mg/dL (ref 0.61–1.24)
GFR, Estimated: >60 mL/min (ref >60)
Glucose, Bld: 364 mg/dL — ABNORMAL HIGH (ref 70–99)
Potassium: 3.8 mmol/L (ref 3.5–5.1)
Sodium: 131 mmol/L — ABNORMAL LOW (ref 135–145)

## 2023-02-07 LAB — TROPONIN I (HIGH SENSITIVITY)
Troponin I (High Sensitivity): 8 ng/L (ref <18)
Troponin I (High Sensitivity): 9 ng/L (ref <18)

## 2023-02-07 MED ORDER — HYDROCODONE-ACETAMINOPHEN 5-325 MG PO TABS
1.0000 | ORAL_TABLET | Freq: Once | ORAL | Status: AC
  Administered 2023-02-07: 1 via ORAL
  Filled 2023-02-07: qty 1

## 2023-02-07 MED ORDER — INSULIN ASPART 100 UNIT/ML IJ SOLN
8.0000 [IU] | Freq: Once | INTRAMUSCULAR | Status: AC
  Administered 2023-02-07: 8 [IU] via SUBCUTANEOUS

## 2023-02-07 NOTE — ED Notes (Signed)
[place] patient daughter and care taker called stating patient has not taken his bp meds in several days has been drinking alcohol today and lives with her and she has COVID and he will not disclose this information to MD

## 2023-02-07 NOTE — ED Notes (Signed)
Patient does not want to stay to lower bgl states he will follow up with pcp and pharmacy concerning his insulin

## 2023-02-07 NOTE — ED Notes (Signed)
Patient presents via wc from triage c/o sharp stabbing left sided cp that began at 9pm. Patient denies n/v/dizziness but states it feels just like when I had a heart attack in 2022

## 2023-02-07 NOTE — ED Triage Notes (Signed)
Left sided chest pains beginning ~9PM while sitting at  home watching tv. Sharp stabbing pains similar to when he had a heart attack in Feb 2022.

## 2023-02-07 NOTE — ED Provider Notes (Signed)
Jacob Gonzales Provider Note   CSN: [id number] Arrival date & time: 02/07/23  0040     History  Chief Complaint  Patient presents with   Chest Pain    Jacob Gonzales is a 67 y.o. male.  The history is provided by the patient and medical records.  Chest Pain Jacob Gonzales is a 67 y.o. male who presents to the Emergency Department complaining of chest pain.  He presents to the emergency department complaining of left-sided chest pain that started between 8 and 10 PM this evening.  It is described as sharp and stabbing in nature and located on left chest.  Pain is worse with moving and sitting up.  His pain is resolved when he lays down or is behind.  No associated fevers, shortness of breath, nausea, vomiting, abdominal pain, leg swelling or pain.  He did have a fall earlier today when a farm gate struck him and he got knocked to the ground.  He was sore from the incident but did not note any significant injuries.  No head injury.  He does have a history of hypertension, diabetes, coronary artery disease status post stenting a year and a half ago.  He states that he is compliant with his medications aside from his Trulicity as this has been on backorder from the pharmacy.      Home Medications Prior to Admission medications   Medication Sig Start Date End Date Taking? Authorizing Provider  amLODipine (NORVASC) 10 MG tablet Take 10 mg by mouth daily. for high blood pressure 02/16/19   [provider]  aspirin 81 MG chewable tablet Chew 1 tablet (81 mg total) by mouth daily. 07/12/15   Vaughan Basta, MD  atorvastatin (LIPITOR) 80 MG tablet Take 1 tablet (80 mg total) by mouth daily. 01/17/21   Sanjuan Dame, MD  carvedilol (COREG) 6.25 MG tablet Take 1 tablet (6.25 mg total) by mouth 2 (two) times daily with a meal. 01/16/21   Sanjuan Dame, MD  dapagliflozin propanediol (FARXIGA) 5 MG TABS tablet Take 1 tablet (5 mg total) by mouth  daily. 01/17/21   Sanjuan Dame, MD  HYDROcodone-acetaminophen (NORCO) 5-325 MG tablet Take 2 tablets by mouth every 4 (four) hours as needed. 01/15/21   Veryl Speak, MD  lisinopril (ZESTRIL) 40 MG tablet Take 1 tablet (40 mg total) by mouth daily. 01/17/21   Sanjuan Dame, MD  nitroGLYCERIN (NITROSTAT) 0.4 MG SL tablet Place 1 tablet (0.4 mg total) under the tongue every 5 (five) minutes as needed for chest pain (CP or SOB). 01/16/21   Sanjuan Dame, MD  ticagrelor (BRILINTA) 90 MG TABS tablet Take 1 tablet (90 mg total) by mouth 2 (two) times daily. 01/16/21   Almyra Deforest, PA  TRULICITY 1.5 0000000 SOPN SMARTSIG:0.5 Milliliter(s) SUB-Q Once a Week 03/25/21   [provider]      Allergies    Patient has no known allergies.    Review of Systems   Review of Systems  Cardiovascular:  Positive for chest pain.  All other systems reviewed and are negative.   Physical Exam Updated Vital Signs BP (!) 147/70   Pulse 89   Temp 97.8 F (36.6 C) (Oral)   Resp (!) 24   Ht '5\' 10"'$  (1.778 m)   Wt 111.1 kg   SpO2 95%   BMI 35.15 kg/m  Physical Exam Vitals and nursing note reviewed.  Constitutional:      Appearance: He is well-developed.  HENT:  Head: Normocephalic and atraumatic.  Cardiovascular:     Rate and Rhythm: Normal rate and regular rhythm.     Heart sounds: No murmur heard. Pulmonary:     Effort: Pulmonary effort is normal. No respiratory distress.     Breath sounds: Normal breath sounds.  Chest:     Chest wall: Tenderness present.  Abdominal:     Palpations: Abdomen is soft.     Tenderness: There is no abdominal tenderness. There is no guarding or rebound.  Musculoskeletal:        General: No swelling or tenderness.     Comments: 2+ DP pulses  Skin:    General: Skin is warm and dry.  Neurological:     Mental Status: He is alert and oriented to person, place, and time.  Psychiatric:        Behavior: Behavior normal.     ED Results / Procedures /  Treatments   Labs (all labs ordered are listed, but only abnormal results are displayed) Labs Reviewed  BASIC METABOLIC PANEL - Abnormal; Notable for the following components:      Result Value   Sodium 131 (*)    Chloride 97 (*)    Glucose, Bld 364 (*)    Calcium 8.5 (*)    All other components within normal limits  CBC - Abnormal; Notable for the following components:   WBC 11.9 (*)    All other components within normal limits  CBG MONITORING, ED - Abnormal; Notable for the following components:   Glucose-Capillary 476 (*)    All other components within normal limits  TROPONIN I (HIGH SENSITIVITY)  TROPONIN I (HIGH SENSITIVITY)    EKG EKG Interpretation  Date/Time:  Tuesday February 07 2023 02:04:08 EST Ventricular Rate:  96 PR Interval:  158 QRS Duration: 106 QT Interval:  364 QTC Calculation: 460 R Axis:   246 Text Interpretation: Sinus rhythm Markedly posterior QRS axis Low voltage, precordial leads Consider right ventricular hypertrophy Confirmed by Quintella Reichert [phone]) on 02/07/2023 2:19:57 AM  Radiology DG Chest 2 View  Result Date: 02/07/2023 CLINICAL DATA:  Left chest pain EXAM: CHEST - 2 VIEW COMPARISON:  01/15/2021 FINDINGS: Lungs are clear.  No pleural effusion or pneumothorax. The heart is normal in size. Visualized osseous structures are within normal limits. IMPRESSION: Normal chest radiographs. Electronically Signed   By: Julian Hy M.D.   On: 02/07/2023 01:23    Procedures Procedures    Medications Ordered in ED Medications  HYDROcodone-acetaminophen (NORCO/VICODIN) 5-325 MG per tablet 1 tablet (1 tablet Oral Given 02/07/23 0301)  insulin aspart (novoLOG) injection 8 Units (8 Units Subcutaneous Given 02/07/23 0302)    ED Course/ Medical Decision Making/ A&P                             Medical Decision Making Amount and/or Complexity of Data Reviewed Labs: ordered. Radiology: ordered.  Risk Prescription drug management.   Patient with  history of hypertension, diabetes, coronary artery disease status post stenting here for evaluation of left-sided chest pain.  EKG is similar when compared to priors and troponins are negative x 2.  He did have an injury earlier today and has reproducible chest tenderness on examination with palpation and movement.  Chest x-ray is negative for acute fracture, pneumothorax, contusion-images personally reviewed and interpreted, agree with radiologist interpretation.  Current clinical picture is not consistent with ACS, PE, dissection.  Discussed with patient and son at the  bedside home care for chest wall pain with OTC analgesics.  Patient is found to be hyperglycemic in the emergency department.  He is not in DKA.  He did have increase in his blood sugar after sliding scale insulin was provided.  Patient does not desire to stay for additional blood glucose adjustments for management.  He does have a PCP that he can follow-up with.  Feel he is stable at this point for outpatient follow-up and return precautions.        Final Clinical Impression(s) / ED Diagnoses Final diagnoses:  Chest wall pain  Hyperglycemia    Rx / DC Orders ED Discharge Orders     None         Quintella Reichert, MD 02/07/23 [phone]

## 2023-02-07 NOTE — ED Notes (Signed)
Provider advised in person bgl [phone]

## 2023-04-11 ENCOUNTER — Encounter (HOSPITAL_COMMUNITY): Payer: Self-pay | Admitting: Internal Medicine

## 2023-04-11 ENCOUNTER — Inpatient Hospital Stay (HOSPITAL_COMMUNITY)
Admission: EM | Disposition: A | Discharge status: Home / Self Care | Payer: Self-pay | Source: Home / Self Care | Attending: Internal Medicine

## 2023-04-11 ENCOUNTER — Inpatient Hospital Stay (HOSPITAL_COMMUNITY)
Admission: EM | Admit: 2023-04-11 | Discharge: 2023-04-17 | DRG: 323 | Disposition: A | Discharge status: Home / Self Care | Payer: Medicaid Other | Attending: Internal Medicine | Admitting: Internal Medicine

## 2023-04-11 DIAGNOSIS — E669 Obesity, unspecified: Secondary | ICD-10-CM | POA: Diagnosis present

## 2023-04-11 DIAGNOSIS — E785 Hyperlipidemia, unspecified: Secondary | ICD-10-CM | POA: Diagnosis present

## 2023-04-11 DIAGNOSIS — K30 Functional dyspepsia: Secondary | ICD-10-CM | POA: Diagnosis present

## 2023-04-11 DIAGNOSIS — E782 Mixed hyperlipidemia: Secondary | ICD-10-CM | POA: Diagnosis not present

## 2023-04-11 DIAGNOSIS — Z955 Presence of coronary angioplasty implant and graft: Secondary | ICD-10-CM

## 2023-04-11 DIAGNOSIS — I2109 ST elevation (STEMI) myocardial infarction involving other coronary artery of anterior wall: Secondary | ICD-10-CM | POA: Diagnosis present

## 2023-04-11 DIAGNOSIS — I110 Hypertensive heart disease with heart failure: Secondary | ICD-10-CM | POA: Diagnosis present

## 2023-04-11 DIAGNOSIS — Z1152 Encounter for screening for COVID-19: Secondary | ICD-10-CM | POA: Diagnosis not present

## 2023-04-11 DIAGNOSIS — I2510 Atherosclerotic heart disease of native coronary artery without angina pectoris: Secondary | ICD-10-CM | POA: Diagnosis not present

## 2023-04-11 DIAGNOSIS — I5043 Acute on chronic combined systolic (congestive) and diastolic (congestive) heart failure: Secondary | ICD-10-CM | POA: Diagnosis not present

## 2023-04-11 DIAGNOSIS — I5021 Acute systolic (congestive) heart failure: Secondary | ICD-10-CM

## 2023-04-11 DIAGNOSIS — I10 Essential (primary) hypertension: Secondary | ICD-10-CM | POA: Diagnosis not present

## 2023-04-11 DIAGNOSIS — Z7902 Long term (current) use of antithrombotics/antiplatelets: Secondary | ICD-10-CM

## 2023-04-11 DIAGNOSIS — Z951 Presence of aortocoronary bypass graft: Secondary | ICD-10-CM | POA: Diagnosis not present

## 2023-04-11 DIAGNOSIS — I252 Old myocardial infarction: Secondary | ICD-10-CM | POA: Diagnosis not present

## 2023-04-11 DIAGNOSIS — I2584 Coronary atherosclerosis due to calcified coronary lesion: Secondary | ICD-10-CM | POA: Diagnosis present

## 2023-04-11 DIAGNOSIS — I2102 ST elevation (STEMI) myocardial infarction involving left anterior descending coronary artery: Secondary | ICD-10-CM | POA: Diagnosis not present

## 2023-04-11 DIAGNOSIS — Z79899 Other long term (current) drug therapy: Secondary | ICD-10-CM

## 2023-04-11 DIAGNOSIS — Z8673 Personal history of transient ischemic attack (TIA), and cerebral infarction without residual deficits: Secondary | ICD-10-CM

## 2023-04-11 DIAGNOSIS — I213 ST elevation (STEMI) myocardial infarction of unspecified site: Secondary | ICD-10-CM | POA: Diagnosis present

## 2023-04-11 DIAGNOSIS — Z7982 Long term (current) use of aspirin: Secondary | ICD-10-CM | POA: Diagnosis not present

## 2023-04-11 DIAGNOSIS — Z833 Family history of diabetes mellitus: Secondary | ICD-10-CM

## 2023-04-11 DIAGNOSIS — Z841 Family history of disorders of kidney and ureter: Secondary | ICD-10-CM

## 2023-04-11 DIAGNOSIS — E118 Type 2 diabetes mellitus with unspecified complications: Secondary | ICD-10-CM | POA: Diagnosis not present

## 2023-04-11 DIAGNOSIS — I3139 Other pericardial effusion (noninflammatory): Secondary | ICD-10-CM | POA: Diagnosis not present

## 2023-04-11 DIAGNOSIS — M159 Polyosteoarthritis, unspecified: Secondary | ICD-10-CM | POA: Diagnosis present

## 2023-04-11 DIAGNOSIS — Z6837 Body mass index (BMI) 37.0-37.9, adult: Secondary | ICD-10-CM | POA: Diagnosis not present

## 2023-04-11 DIAGNOSIS — E1165 Type 2 diabetes mellitus with hyperglycemia: Secondary | ICD-10-CM | POA: Diagnosis present

## 2023-04-11 DIAGNOSIS — Z8249 Family history of ischemic heart disease and other diseases of the circulatory system: Secondary | ICD-10-CM | POA: Diagnosis not present

## 2023-04-11 DIAGNOSIS — Z9861 Coronary angioplasty status: Secondary | ICD-10-CM

## 2023-04-11 DIAGNOSIS — Z85841 Personal history of malignant neoplasm of brain: Secondary | ICD-10-CM | POA: Diagnosis not present

## 2023-04-11 DIAGNOSIS — I5023 Acute on chronic systolic (congestive) heart failure: Secondary | ICD-10-CM | POA: Diagnosis present

## 2023-04-11 DIAGNOSIS — Z01810 Encounter for preprocedural cardiovascular examination: Secondary | ICD-10-CM | POA: Diagnosis not present

## 2023-04-11 HISTORY — PX: CORONARY BALLOON ANGIOPLASTY: CATH118233

## 2023-04-11 HISTORY — PX: CORONARY/GRAFT ACUTE MI REVASCULARIZATION: CATH118305

## 2023-04-11 HISTORY — PX: LEFT HEART CATH AND CORONARY ANGIOGRAPHY: CATH118249

## 2023-04-11 LAB — COMPREHENSIVE METABOLIC PANEL
ALT: 19 U/L (ref 0–44)
AST: 24 U/L (ref 15–41)
Albumin: 3.6 g/dL (ref 3.5–5.0)
Alkaline Phosphatase: 77 U/L (ref 38–126)
Anion gap: 13 (ref 5–15)
BUN: 10 mg/dL (ref 8–23)
CO2: 21 mmol/L — ABNORMAL LOW (ref 22–32)
Calcium: 8.5 mg/dL — ABNORMAL LOW (ref 8.9–10.3)
Chloride: 101 mmol/L (ref 98–111)
Creatinine, Ser: 1.01 mg/dL (ref 0.61–1.24)
GFR, Estimated: >60 mL/min (ref >60)
Glucose, Bld: 227 mg/dL — ABNORMAL HIGH (ref 70–99)
Potassium: 3.4 mmol/L — ABNORMAL LOW (ref 3.5–5.1)
Sodium: 135 mmol/L (ref 135–145)
Total Bilirubin: 1 mg/dL (ref 0.3–1.2)
Total Protein: 7.4 g/dL (ref 6.5–8.1)

## 2023-04-11 LAB — CBC WITH DIFFERENTIAL/PLATELET
Abs Immature Granulocytes: 0.21 10*3/uL — ABNORMAL HIGH (ref 0.00–0.07)
Basophils Absolute: 0.1 10*3/uL (ref 0.0–0.1)
Basophils Relative: 1 %
Eosinophils Absolute: 0.2 10*3/uL (ref 0.0–0.5)
Eosinophils Relative: 1 %
HCT: 46.6 % (ref 39.0–52.0)
Hemoglobin: 16.2 g/dL (ref 13.0–17.0)
Immature Granulocytes: 1 %
Lymphocytes Relative: 22 %
Lymphs Abs: 3.7 10*3/uL (ref 0.7–4.0)
MCH: 30.3 pg (ref 26.0–34.0)
MCHC: 34.8 g/dL (ref 30.0–36.0)
MCV: 87.3 fL (ref 80.0–100.0)
Monocytes Absolute: 1.6 10*3/uL — ABNORMAL HIGH (ref 0.1–1.0)
Monocytes Relative: 10 %
Neutro Abs: 11.1 10*3/uL — ABNORMAL HIGH (ref 1.7–7.7)
Neutrophils Relative %: 65 %
Platelets: 311 10*3/uL (ref 150–400)
RBC: 5.34 MIL/uL (ref 4.22–5.81)
RDW: 13.7 % (ref 11.5–15.5)
WBC: 17 10*3/uL — ABNORMAL HIGH (ref 4.0–10.5)
nRBC: 0 % (ref 0.0–0.2)

## 2023-04-11 LAB — POCT I-STAT 7, (LYTES, BLD GAS, ICA,H+H)
Acid-base deficit: 3 mmol/L — ABNORMAL HIGH (ref 0.0–2.0)
Bicarbonate: 18.2 mmol/L — ABNORMAL LOW (ref 20.0–28.0)
Calcium, Ion: 1.07 mmol/L — ABNORMAL LOW (ref 1.15–1.40)
HCT: 44 % (ref 39.0–52.0)
Hemoglobin: 15 g/dL (ref 13.0–17.0)
O2 Saturation: 100 %
Potassium: 3.9 mmol/L (ref 3.5–5.1)
Sodium: 135 mmol/L (ref 135–145)
TCO2: 19 mmol/L — ABNORMAL LOW (ref 22–32)
pCO2 arterial: 23 mmHg — ABNORMAL LOW (ref 32–48)
pH, Arterial: 7.506 — ABNORMAL HIGH (ref 7.35–7.45)
pO2, Arterial: 146 mmHg — ABNORMAL HIGH (ref 83–108)

## 2023-04-11 LAB — POCT ACTIVATED CLOTTING TIME
Activated Clotting Time: 412 seconds
Activated Clotting Time: 650 seconds
Activated Clotting Time: 828 seconds

## 2023-04-11 LAB — POCT I-STAT, CHEM 8
BUN: 13 mg/dL (ref 8–23)
Calcium, Ion: 1.1 mmol/L — ABNORMAL LOW (ref 1.15–1.40)
Chloride: 102 mmol/L (ref 98–111)
Creatinine, Ser: 1 mg/dL (ref 0.61–1.24)
Glucose, Bld: 233 mg/dL — ABNORMAL HIGH (ref 70–99)
HCT: 48 % (ref 39.0–52.0)
Hemoglobin: 16.3 g/dL (ref 13.0–17.0)
Potassium: 3.6 mmol/L (ref 3.5–5.1)
Sodium: 138 mmol/L (ref 135–145)
TCO2: 23 mmol/L (ref 22–32)

## 2023-04-11 LAB — HEMOGLOBIN A1C
Hgb A1c MFr Bld: 8 % — ABNORMAL HIGH (ref 4.8–5.6)
Mean Plasma Glucose: 182.9 mg/dL

## 2023-04-11 LAB — LIPID PANEL
Cholesterol: 227 mg/dL — ABNORMAL HIGH (ref 0–200)
HDL: 29 mg/dL — ABNORMAL LOW (ref >40)
LDL Cholesterol: 176 mg/dL — ABNORMAL HIGH (ref 0–99)
Total CHOL/HDL Ratio: 7.8 RATIO
Triglycerides: 111 mg/dL (ref <150)
VLDL: 22 mg/dL (ref 0–40)

## 2023-04-11 LAB — PROTIME-INR
INR: 1.2 (ref 0.8–1.2)
Prothrombin Time: 15.8 seconds — ABNORMAL HIGH (ref 11.4–15.2)

## 2023-04-11 LAB — TROPONIN I (HIGH SENSITIVITY): Troponin I (High Sensitivity): >24000 ng/L (ref <18)

## 2023-04-11 LAB — MRSA NEXT GEN BY PCR, NASAL: MRSA by PCR Next Gen: NOT DETECTED

## 2023-04-11 LAB — GLUCOSE, CAPILLARY
Glucose-Capillary: 284 mg/dL — ABNORMAL HIGH (ref 70–99)
Glucose-Capillary: 278 mg/dL — ABNORMAL HIGH (ref 70–99)

## 2023-04-11 LAB — APTT: aPTT: >200 seconds (ref 24–36)

## 2023-04-11 SURGERY — LEFT HEART CATH AND CORONARY ANGIOGRAPHY
Anesthesia: LOCAL

## 2023-04-11 MED ORDER — HEPARIN (PORCINE) IN NACL 1000-0.9 UT/500ML-% IV SOLN
INTRAVENOUS | Status: DC | PRN
  Administered 2023-04-11 (×2): 500 mL

## 2023-04-11 MED ORDER — METOPROLOL TARTRATE 5 MG/5ML IV SOLN
INTRAVENOUS | Status: AC
  Filled 2023-04-11: qty 5

## 2023-04-11 MED ORDER — FENTANYL CITRATE (PF) 100 MCG/2ML IJ SOLN
INTRAMUSCULAR | Status: AC
  Filled 2023-04-11: qty 2

## 2023-04-11 MED ORDER — SODIUM CHLORIDE 0.9 % IV SOLN
0.7500 ug/kg/min | INTRAVENOUS | Status: DC
  Administered 2023-04-11 (×2): 4 ug/kg/min via INTRAVENOUS
  Administered 2023-04-12 – 2023-04-13 (×4): 0.75 ug/kg/min via INTRAVENOUS
  Filled 2023-04-11 (×6): qty 50

## 2023-04-11 MED ORDER — THIAMINE MONONITRATE 100 MG PO TABS
100.0000 mg | ORAL_TABLET | Freq: Every day | ORAL | Status: DC
  Administered 2023-04-11 – 2023-04-17 (×6): 100 mg via ORAL
  Filled 2023-04-11 (×7): qty 1

## 2023-04-11 MED ORDER — CHLORHEXIDINE GLUCONATE CLOTH 2 % EX PADS
6.0000 | MEDICATED_PAD | Freq: Every day | CUTANEOUS | Status: DC
  Administered 2023-04-11 – 2023-04-17 (×7): 6 via TOPICAL

## 2023-04-11 MED ORDER — FUROSEMIDE 10 MG/ML IJ SOLN
INTRAMUSCULAR | Status: AC
  Filled 2023-04-11: qty 4

## 2023-04-11 MED ORDER — HEPARIN (PORCINE) 25000 UT/250ML-% IV SOLN: 1300.0000 [IU]/h | INTRAVENOUS | Status: DC

## 2023-04-11 MED ORDER — NITROGLYCERIN IN D5W 200-5 MCG/ML-% IV SOLN
0.0000 ug/min | INTRAVENOUS | Status: DC
  Administered 2023-04-11: 60 ug/min via INTRAVENOUS
  Administered 2023-04-12: 70 ug/min via INTRAVENOUS
  Filled 2023-04-11: qty 250

## 2023-04-11 MED ORDER — ADULT MULTIVITAMIN W/MINERALS CH
1.0000 | ORAL_TABLET | Freq: Every day | ORAL | Status: DC
  Administered 2023-04-11 – 2023-04-17 (×4): 1 via ORAL
  Filled 2023-04-11 (×6): qty 1

## 2023-04-11 MED ORDER — HEPARIN SODIUM (PORCINE) 1000 UNIT/ML IJ SOLN
INTRAMUSCULAR | Status: DC | PRN
  Administered 2023-04-11: 5000 [IU] via INTRA_ARTERIAL
  Administered 2023-04-11: 8000 [IU] via INTRAVENOUS

## 2023-04-11 MED ORDER — SODIUM CHLORIDE 0.9% FLUSH: 3.0000 mL | INTRAVENOUS | Status: DC | PRN

## 2023-04-11 MED ORDER — IOHEXOL 350 MG/ML SOLN
INTRAVENOUS | Status: DC | PRN
  Administered 2023-04-11: 130 mL

## 2023-04-11 MED ORDER — VERAPAMIL HCL 2.5 MG/ML IV SOLN
INTRAVENOUS | Status: AC
  Filled 2023-04-11: qty 2

## 2023-04-11 MED ORDER — MIDAZOLAM HCL 2 MG/2ML IJ SOLN
INTRAMUSCULAR | Status: AC
  Filled 2023-04-11: qty 2

## 2023-04-11 MED ORDER — HYDRALAZINE HCL 20 MG/ML IJ SOLN
INTRAMUSCULAR | Status: DC | PRN
  Administered 2023-04-11: 20 mg via INTRAVENOUS

## 2023-04-11 MED ORDER — HEPARIN (PORCINE) 25000 UT/250ML-% IV SOLN
2100.0000 [IU]/h | INTRAVENOUS | Status: DC
  Administered 2023-04-11: 1300 [IU]/h via INTRAVENOUS
  Administered 2023-04-12: 1600 [IU]/h via INTRAVENOUS
  Administered 2023-04-13 (×2): 1800 [IU]/h via INTRAVENOUS
  Administered 2023-04-14: 2100 [IU]/h via INTRAVENOUS
  Filled 2023-04-11 (×5): qty 250

## 2023-04-11 MED ORDER — FUROSEMIDE 10 MG/ML IJ SOLN
40.0000 mg | Freq: Two times a day (BID) | INTRAMUSCULAR | Status: DC
  Administered 2023-04-12 – 2023-04-13 (×3): 40 mg via INTRAVENOUS
  Filled 2023-04-11 (×3): qty 4

## 2023-04-11 MED ORDER — FUROSEMIDE 10 MG/ML IJ SOLN
INTRAMUSCULAR | Status: DC | PRN
  Administered 2023-04-11: 20 mg via INTRAVENOUS

## 2023-04-11 MED ORDER — SODIUM CHLORIDE 0.9 % IV SOLN: 250.0000 mL | INTRAVENOUS | Status: DC | PRN

## 2023-04-11 MED ORDER — ONDANSETRON HCL 4 MG/2ML IJ SOLN
4.0000 mg | Freq: Four times a day (QID) | INTRAMUSCULAR | Status: DC | PRN
  Administered 2023-04-12 – 2023-04-14 (×2): 4 mg via INTRAVENOUS
  Filled 2023-04-11 (×3): qty 2

## 2023-04-11 MED ORDER — INSULIN ASPART 100 UNIT/ML IJ SOLN
0.0000 [IU] | Freq: Three times a day (TID) | INTRAMUSCULAR | Status: DC
  Administered 2023-04-11: 8 [IU] via SUBCUTANEOUS
  Administered 2023-04-12: 3 [IU] via SUBCUTANEOUS
  Administered 2023-04-12: 5 [IU] via SUBCUTANEOUS
  Administered 2023-04-12 – 2023-04-13 (×3): 3 [IU] via SUBCUTANEOUS
  Administered 2023-04-14 – 2023-04-15 (×3): 2 [IU] via SUBCUTANEOUS
  Administered 2023-04-16: 3 [IU] via SUBCUTANEOUS
  Administered 2023-04-16: 2 [IU] via SUBCUTANEOUS
  Administered 2023-04-16: 3 [IU] via SUBCUTANEOUS
  Administered 2023-04-17: 2 [IU] via SUBCUTANEOUS
  Administered 2023-04-17: 3 [IU] via SUBCUTANEOUS

## 2023-04-11 MED ORDER — FUROSEMIDE 10 MG/ML IJ SOLN
20.0000 mg | Freq: Once | INTRAMUSCULAR | Status: AC
  Administered 2023-04-11: 20 mg via INTRAVENOUS
  Filled 2023-04-11: qty 2

## 2023-04-11 MED ORDER — NITROGLYCERIN IN D5W 200-5 MCG/ML-% IV SOLN
INTRAVENOUS | Status: DC | PRN
  Administered 2023-04-11: 5 ug/min via INTRAVENOUS

## 2023-04-11 MED ORDER — LORAZEPAM 1 MG PO TABS
1.0000 mg | ORAL_TABLET | ORAL | Status: DC | PRN
  Administered 2023-04-13 – 2023-04-14 (×4): 1 mg via ORAL
  Filled 2023-04-11 (×4): qty 1

## 2023-04-11 MED ORDER — DEXMEDETOMIDINE HCL IN NACL 400 MCG/100ML IV SOLN
0.2000 ug/kg/h | INTRAVENOUS | Status: DC
  Filled 2023-04-11: qty 100

## 2023-04-11 MED ORDER — ACETAMINOPHEN 325 MG PO TABS
650.0000 mg | ORAL_TABLET | ORAL | Status: DC | PRN
  Administered 2023-04-12 – 2023-04-17 (×2): 650 mg via ORAL
  Filled 2023-04-11 (×2): qty 2

## 2023-04-11 MED ORDER — LABETALOL HCL 5 MG/ML IV SOLN: 10.0000 mg | INTRAVENOUS | Status: AC | PRN

## 2023-04-11 MED ORDER — NITROGLYCERIN IN D5W 200-5 MCG/ML-% IV SOLN
INTRAVENOUS | Status: AC
  Filled 2023-04-11: qty 250

## 2023-04-11 MED ORDER — FENTANYL CITRATE (PF) 100 MCG/2ML IJ SOLN
INTRAMUSCULAR | Status: DC | PRN
  Administered 2023-04-11: 25 ug via INTRAVENOUS
  Administered 2023-04-11: 50 ug via INTRAVENOUS

## 2023-04-11 MED ORDER — HEPARIN SODIUM (PORCINE) 1000 UNIT/ML IJ SOLN
INTRAMUSCULAR | Status: AC
  Filled 2023-04-11: qty 10

## 2023-04-11 MED ORDER — SODIUM CHLORIDE 0.9 % IV SOLN
INTRAVENOUS | Status: DC | PRN
  Administered 2023-04-11: 4 ug/kg/min via INTRAVENOUS

## 2023-04-11 MED ORDER — CANGRELOR BOLUS VIA INFUSION
INTRAVENOUS | Status: DC | PRN
  Administered 2023-04-11: 3540 ug via INTRAVENOUS

## 2023-04-11 MED ORDER — SODIUM CHLORIDE 0.9 % IV SOLN
INTRAVENOUS | Status: DC | PRN
  Administered 2023-04-11 (×2): 10 mL/h via INTRAVENOUS

## 2023-04-11 MED ORDER — FOLIC ACID 1 MG PO TABS
1.0000 mg | ORAL_TABLET | Freq: Every day | ORAL | Status: DC
  Administered 2023-04-11 – 2023-04-17 (×6): 1 mg via ORAL
  Filled 2023-04-11 (×7): qty 1

## 2023-04-11 MED ORDER — CANGRELOR TETRASODIUM 50 MG IV SOLR
INTRAVENOUS | Status: AC
  Filled 2023-04-11: qty 50

## 2023-04-11 MED ORDER — MIDAZOLAM HCL 2 MG/2ML IJ SOLN
INTRAMUSCULAR | Status: DC | PRN
  Administered 2023-04-11 (×2): 1 mg via INTRAVENOUS

## 2023-04-11 MED ORDER — METOPROLOL TARTRATE 5 MG/5ML IV SOLN
INTRAVENOUS | Status: DC | PRN
  Administered 2023-04-11: 5 mg via INTRAVENOUS

## 2023-04-11 MED ORDER — HYDRALAZINE HCL 20 MG/ML IJ SOLN
INTRAMUSCULAR | Status: AC
  Filled 2023-04-11: qty 1

## 2023-04-11 MED ORDER — ATORVASTATIN CALCIUM 40 MG PO TABS
80.0000 mg | ORAL_TABLET | Freq: Every day | ORAL | Status: DC
  Administered 2023-04-13 – 2023-04-17 (×5): 80 mg via ORAL
  Filled 2023-04-11 (×4): qty 2
  Filled 2023-04-11: qty 8
  Filled 2023-04-11: qty 2

## 2023-04-11 MED ORDER — VERAPAMIL HCL 2.5 MG/ML IV SOLN
INTRAVENOUS | Status: DC | PRN
  Administered 2023-04-11: 5 mL via INTRA_ARTERIAL

## 2023-04-11 MED ORDER — ASPIRIN 81 MG PO CHEW
81.0000 mg | CHEWABLE_TABLET | Freq: Every day | ORAL | Status: DC
  Filled 2023-04-11: qty 1

## 2023-04-11 MED ORDER — LIDOCAINE HCL (PF) 1 % IJ SOLN
INTRAMUSCULAR | Status: AC
  Filled 2023-04-11: qty 30

## 2023-04-11 MED ORDER — HYDRALAZINE HCL 20 MG/ML IJ SOLN: 10.0000 mg | INTRAMUSCULAR | Status: AC | PRN

## 2023-04-11 MED ORDER — SODIUM CHLORIDE 0.9% FLUSH
3.0000 mL | Freq: Two times a day (BID) | INTRAVENOUS | Status: DC
  Administered 2023-04-12 – 2023-04-17 (×7): 3 mL via INTRAVENOUS

## 2023-04-11 SURGICAL SUPPLY — 17 items
BALLN EMERGE MR 2.5X12 (BALLOONS) ×1
BALLOON EMERGE MR 2.5X12 (BALLOONS) IMPLANT
CATH INFINITI 5FR ANG PIGTAIL (CATHETERS) IMPLANT
CATH INFINITI JR4 5F (CATHETERS) IMPLANT
CATH LAUNCHER 6FR EBU3.5 (CATHETERS) IMPLANT
DEVICE RAD COMP TR BAND LRG (VASCULAR PRODUCTS) IMPLANT
ELECT DEFIB PAD ADLT CADENCE (PAD) IMPLANT
GLIDESHEATH SLEND SS 6F .021 (SHEATH) IMPLANT
KIT ENCORE 26 ADVANTAGE (KITS) IMPLANT
KIT HEART LEFT (KITS) ×1 IMPLANT
KIT HEMO VALVE WATCHDOG (MISCELLANEOUS) IMPLANT
PACK CARDIAC CATHETERIZATION (CUSTOM PROCEDURE TRAY) ×1 IMPLANT
SYR MEDRAD MARK 7 150ML (SYRINGE) ×1 IMPLANT
TRANSDUCER W/STOPCOCK (MISCELLANEOUS) ×1 IMPLANT
TUBING CIL FLEX 10 FLL-RA (TUBING) ×1 IMPLANT
WIRE EMERALD 3MM-J .035X260CM (WIRE) IMPLANT
WIRE RUNTHROUGH IZANAI 014 180 (WIRE) IMPLANT

## 2023-04-11 NOTE — Progress Notes (Signed)
ANTICOAGULATION CONSULT NOTE - Initial Consult  Pharmacy Consult for IV heparin Indication: CAD awaiting CABG  No Known Allergies  Patient Measurements: Weight: 118 kg (260 lb 2.3 oz) Heparin Dosing Weight: ~ 95 kg  Vital Signs: Temp: 98 F (36.7 C) (05/07 2000) Temp Source: Oral (05/07 2000) BP: 120/71 (05/07 2000) Pulse Rate: 93 (05/07 2000)  Labs: Recent Labs    04/11/23 1650 04/11/23 1653 04/11/23 1737  HGB 16.2 16.3 15.0  HCT 46.6 48.0 44.0  PLT 311  --   --   APTT >200*  --   --   LABPROT 15.8*  --   --   INR 1.2  --   --   CREATININE 1.01 1.00  --   TROPONINIHS 813*  --   --     Estimated Creatinine Clearance: 96.1 mL/min (by C-G formula based on SCr of 1 mg/dL).   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   Arthritis    Bilateral shoulders, Hands, Knees   Bone cancer (HCC) 2016   Brain cancer (HCC) 2016   Diabetes (HCC) 11/17/2015   HTN (hypertension) 11/17/2015   NSTEMI (non-ST elevated myocardial infarction) (HCC)     Medications:  Infusions:   sodium chloride     cangrelor (KENGREAL) 50 mg in sodium chloride 0.9 % 250 mL (0.2 mg/mL) infusion 4 mcg/kg/min (04/11/23 2000)   dexmedetomidine (PRECEDEX) IV infusion     heparin     nitroGLYCERIN 80 mcg/min (04/11/23 2000)    Assessment: 67 yo male admitted with STEMI, found with multivessel dx, pharmacy asked to begin IV Heparin gtt after TR band off.  Awaiting CABG Friday.  Goal of Therapy:  Heparin level 0.3-0.7 units/ml Monitor platelets by anticoagulation protocol: Yes   Plan:  Start IV heparin gtt at 1300 units/hr. Check heparin level 8 hrs after gtt starts Daily heparin level and CBC.  Reece Leader, Colon Flattery, BCCP Clinical Pharmacist  04/11/2023 8:20 PM   [facility] pharmacy phone numbers are listed on [email or web address]

## 2023-04-11 NOTE — Consult Note (Signed)
[address]       [place] [address]             [phone]        Maxine Nish [facility] Medical Record #[id number] Date of Birth: Feb 14, 1956  Referring: No ref. provider found Primary Care: Ronal Fear, NP (Inactive) Primary Cardiologist:Jayadeep Eldridge Dace, MD  Chief Complaint:   No chief complaint on file.   History of Present Illness:     67yo male admitted with anterior STEMI.  Currently in the cath lab for PCI.  He had a Hx of CAD, HTN, DM, and CVA.    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   Arthritis    Bilateral shoulders, Hands, Knees   Bone cancer (HCC) 2016   Brain cancer (HCC) 2016   Diabetes (HCC) 11/17/2015   HTN (hypertension) 11/17/2015   NSTEMI (non-ST elevated myocardial infarction) [facility])     Past Surgical History:  Procedure Laterality Date   CORONARY STENT INTERVENTION N/A 01/15/2021   Procedure: CORONARY STENT INTERVENTION;  Surgeon: Corky Crafts, MD;  Location: [facility] INVASIVE CV LAB;  Service: Cardiovascular;  Laterality: N/A;   CORONARY ULTRASOUND/IVUS N/A 01/15/2021   Procedure: Intravascular Ultrasound/IVUS;  Surgeon: Corky Crafts, MD;  Location: [facility] INVASIVE CV LAB;  Service: Cardiovascular;  Laterality: N/A;   LEFT HEART CATH AND CORONARY ANGIOGRAPHY N/A 01/15/2021   Procedure: LEFT HEART CATH AND CORONARY ANGIOGRAPHY;  Surgeon: Corky Crafts, MD;  Location: [facility] INVASIVE CV LAB;  Service: Cardiovascular;  Laterality: N/A;   NO PAST SURGERIES        Social History   Tobacco Use  Smoking Status Never  Smokeless Tobacco Never    Social History   Substance and Sexual Activity  Alcohol Use Yes     No Known Allergies    Current Facility-Administered Medications  Medication Dose Route Frequency Provider Last Rate Last Admin   0.9 %  sodium chloride infusion    Continuous PRN Orbie Pyo, MD 10 mL/hr at 04/11/23  1654 10 mL/hr at 04/11/23 1654   fentaNYL (SUBLIMAZE) injection    PRN Orbie Pyo, MD   25 mcg at 04/11/23 1710   Heparin (Porcine) in NaCl 1000-0.9 UT/500ML-% SOLN    PRN Orbie Pyo, MD   500 mL at 04/11/23 1636   heparin sodium (porcine) injection    PRN Orbie Pyo, MD   5,000 Units at 04/11/23 1641   metoprolol tartrate (LOPRESSOR) injection    PRN Orbie Pyo, MD   5 mg at 04/11/23 1649   midazolam (VERSED) injection    PRN Orbie Pyo, MD   1 mg at 04/11/23 1710   nitroGLYCERIN 50 mg in dextrose 5 % 250 mL (0.2 mg/mL) infusion    Continuous PRN Orbie Pyo, MD 13.5 mL/hr at 04/11/23 1719 45 mcg/min at 04/11/23 1719   Radial Cocktail/Verapamil only    PRN Orbie Pyo, MD   5 mL at 04/11/23 1640    Medications Prior to Admission  Medication Sig Dispense Refill Last Dose   amLODipine (NORVASC) 10 MG tablet Take 10 mg by mouth daily. for high blood pressure      aspirin 81 MG chewable tablet Chew 1 tablet (81 mg total) by mouth daily. 30 tablet 3    atorvastatin (LIPITOR) 80 MG tablet Take 1 tablet (80 mg total) by mouth daily. 30 tablet  0    carvedilol (COREG) 6.25 MG tablet Take 1 tablet (6.25 mg total) by mouth 2 (two) times daily with a meal. 60 tablet 0    dapagliflozin propanediol (FARXIGA) 5 MG TABS tablet Take 1 tablet (5 mg total) by mouth daily. 30 tablet 0    HYDROcodone-acetaminophen (NORCO) 5-325 MG tablet Take 2 tablets by mouth every 4 (four) hours as needed. 10 tablet 0    lisinopril (ZESTRIL) 40 MG tablet Take 1 tablet (40 mg total) by mouth daily. 30 tablet 0    nitroGLYCERIN (NITROSTAT) 0.4 MG SL tablet Place 1 tablet (0.4 mg total) under the tongue every 5 (five) minutes as needed for chest pain (CP or SOB). 10 tablet 0    ticagrelor (BRILINTA) 90 MG TABS tablet Take 1 tablet (90 mg total) by mouth 2 (two) times daily. 180 tablet 3    TRULICITY 1.5 MG/0.5ML SOPN SMARTSIG:0.5 Milliliter(s) SUB-Q Once a Week       Family History   Problem Relation Age of Onset   Heart attack Mother    Heart attack Father    Diabetes Mellitus II Father    Kidney failure Father    Uterine cancer Sister    Heart attack Brother    Heart attack Maternal Aunt    Heart attack Maternal Uncle    Heart attack Paternal Aunt    Heart attack Paternal Uncle    Stroke Neg Hx      Review of Systems:   ROS    Physical Exam: Wt 118 kg   BMI 37.33 kg/m  Physical Exam    Diagnostic Studies & Laboratory data:    Left Heart Catherization: LM disease with occluded LAD.  Balloon angioplasty to the LAD.   Echo: pending EKG: Sinus tach I have independently reviewed the above radiologic studies and discussed with the patient   Recent Lab Findings: Lab Results  Component Value Date   WBC 11.9 (H) 02/07/2023   HGB 15.8 02/07/2023   HCT 45.6 02/07/2023   PLT 280 02/07/2023   GLUCOSE 364 (H) 02/07/2023   CHOL 205 (H) 01/15/2021   TRIG 101 01/15/2021   HDL 27 (L) 01/15/2021   LDLCALC 158 (H) 01/15/2021   ALT 34 01/16/2021   AST 67 (H) 01/16/2021   NA 131 (L) 02/07/2023   K 3.8 02/07/2023   CL 97 (L) 02/07/2023   CREATININE 0.88 02/07/2023   BUN 8 02/07/2023   CO2 22 02/07/2023   TSH 0.930 01/15/2021   INR 1.1 01/15/2021   HGBA1C 6.8 (H) 01/15/2021      Assessment / Plan:   66yo with anterior STEMI and occluded LAD.  He also has LM disease.  He underwent balloon angioplasty and has been placed on Cangrelor.  His echo is pending.  Will plan for CABG later in the week.          Corliss Skains 04/11/2023 5:21 PM

## 2023-04-11 NOTE — H&P (Addendum)
Cardiology Admission History and Physical   Patient ID: Jacob Gonzales MRN: [id number]; DOB: 09/08/1956   Admission date: 04/11/2023  PCP:  Jacob Fear, NP (Inactive)   [facility] Providers Cardiologist:  Jacob Muss, MD        Chief Complaint:  STEMI  Patient Profile:   Jacob Gonzales is a 67 y.o. male with PMH CAD (with NSTEMI 2022), hypertension, CVA, DM type II, pulmonary nodules who is being seen and admitted 04/11/2023 with anterior STEMI.  History of Present Illness:   Jacob Gonzales presented to the hospital today as a field initiated STEMI. 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 Per EMS, patient reported that he actually developed pain 2 days ago but then had resolution with ibuprofen. When pain recurred today with significant severity, he took ASA, ibuprofen, and called EMS. EMS ECG noted ST elevation in anterior leads and decision made to activate cath lab. En route, patient initially hypertensive but only received nitroglycerin x1 due to onset of dizziness following administration. *Hx per EMS personnel due to patient already undergoing LHC.  Of note, patient with hx NSTEMI in 2022. This noted 25% stenosis of distal RCA, 25% stenosis of RPDA, mid LM to dist LM 25% stenosed, 2nd Diag lesion is 25% stenosed, Ost LAD to Prox LAD lesion is 25% stenosed, Mid LAD-1 lesion is 50% stenosed, Mid LAD-2 lesion is 25% stenosed, Ost Cx to Prox Cx lesion is 25% stenosed, 2nd Mrg lesion is 99% stenosed. A drug-eluting stent was successfully placed using a STENT RESOLUTE ONYX 3.0X22, postdilated to 3.75 in the proximal and mid stent, optimized with IVUS. LVEF on echo noted to be 40-45% and global hypokinesis, grade I diastolic dysfunction.   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   Arthritis    Bilateral shoulders, Hands, Knees    Bone cancer (HCC) 2016   Brain cancer (HCC) 2016   Diabetes (HCC) 11/17/2015   HTN (hypertension) 11/17/2015   NSTEMI (non-ST elevated myocardial infarction) [facility])     Past Surgical History:  Procedure Laterality Date   CORONARY STENT INTERVENTION N/A 01/15/2021   Procedure: CORONARY STENT INTERVENTION;  Surgeon: Corky Crafts, MD;  Location: [facility] INVASIVE CV LAB;  Service: Cardiovascular;  Laterality: N/A;   CORONARY ULTRASOUND/IVUS N/A 01/15/2021   Procedure: Intravascular Ultrasound/IVUS;  Surgeon: Corky Crafts, MD;  Location: [facility] INVASIVE CV LAB;  Service: Cardiovascular;  Laterality: N/A;   LEFT HEART CATH AND CORONARY ANGIOGRAPHY N/A 01/15/2021   Procedure: LEFT HEART CATH AND CORONARY ANGIOGRAPHY;  Surgeon: Corky Crafts, MD;  Location: [facility] INVASIVE CV LAB;  Service: Cardiovascular;  Laterality: N/A;   NO PAST SURGERIES       Medications Prior to Admission: Prior to Admission medications   Medication Sig Start Date End Date Taking? Authorizing Provider  amLODipine (NORVASC) 10 MG tablet Take 10 mg by mouth daily. for high blood pressure 02/16/19   [provider]  aspirin 81 MG chewable tablet Chew 1 tablet (81 mg total) by mouth daily. 07/12/15   Altamese Dilling, MD  atorvastatin (LIPITOR) 80 MG tablet Take 1 tablet (80 mg total) by mouth daily. 01/17/21   Evlyn Kanner, MD  carvedilol (COREG) 6.25 MG tablet Take 1 tablet (6.25 mg total) by mouth 2 (two) times daily with a meal. 01/16/21   Evlyn Kanner, MD  dapagliflozin propanediol (FARXIGA) 5  MG TABS tablet Take 1 tablet (5 mg total) by mouth daily. 01/17/21   Evlyn Kanner, MD  HYDROcodone-acetaminophen (NORCO) 5-325 MG tablet Take 2 tablets by mouth every 4 (four) hours as needed. 01/15/21   Geoffery Lyons, MD  lisinopril (ZESTRIL) 40 MG tablet Take 1 tablet (40 mg total) by mouth daily. 01/17/21   Evlyn Kanner, MD  nitroGLYCERIN (NITROSTAT) 0.4 MG SL tablet Place 1 tablet (0.4 mg total)  under the tongue every 5 (five) minutes as needed for chest pain (CP or SOB). 01/16/21   Evlyn Kanner, MD  ticagrelor (BRILINTA) 90 MG TABS tablet Take 1 tablet (90 mg total) by mouth 2 (two) times daily. 01/16/21   Azalee Course, PA  TRULICITY 1.5 MG/0.5ML SOPN SMARTSIG:0.5 Milliliter(s) SUB-Q Once a Week 03/25/21   [provider]     Allergies:   No Known Allergies  Social History:   Social History   Socioeconomic History   Marital status: Married    Spouse name: Not on file   Number of children: 3   Years of education: Not on file   Highest education level: Not on file  Occupational History   Not on file  Tobacco Use   Smoking status: Never   Smokeless tobacco: Never  Vaping Use   Vaping Use: Never used  Substance and Sexual Activity   Alcohol use: Yes   Drug use: No   Sexual activity: Not on file  Other Topics Concern   Not on file  Social History Narrative   Lives at home with wife   Caffeine use: Drinks soda (2-12packs per week)   Social Determinants of Health   Financial Resource Strain: Not on file  Food Insecurity: Not on file  Transportation Needs: Not on file  Physical Activity: Not on file  Stress: Not on file  Social Connections: Not on file  Intimate Partner Violence: Not on file    Family History:   The patient's family history includes Diabetes Mellitus II in his father; Heart attack in his brother, father, maternal aunt, maternal uncle, mother, paternal aunt, and paternal uncle; Kidney failure in his father; Uterine cancer in his sister. There is no history of Stroke.    ROS:  Please see the history of present illness.  All other ROS reviewed and negative.     Physical Exam/Data:   Vitals:   04/11/23 1629  Weight: 118 kg   No intake or output data in the 24 hours ending 04/11/23 1640    04/11/2023    4:29 PM 02/07/2023    1:07 AM 01/03/2022    4:18 PM  Last 3 Weights  Weight (lbs) 260 lb 2.3 oz 245 lb 245 lb  Weight (kg) 118 kg  111.131 kg 111.131 kg     Body mass index is 37.33 kg/m.   Physical exam  General: Mild distress alert and oriented x 3 HEENT: Moist mucous membranes, JVP does not seem to be elevated Cardiac: Tachycardic, no murmurs gallops rubs or thrills Respiratory: Anterior lung fields are clear GI: Soft nontender nondistended, sparse bowel sounds Extremities: No edema ecchymosis cyanosis; warm and well-perfused Neuro: Cranial nerves II through XII are grossly intact Psych: No anxiety  EKG:  ECG pending after urgent LHC for STEMI  Relevant CV Studies:  01/15/2021 TTE  IMPRESSIONS     1. Left ventricular ejection fraction, by estimation, is 40 to 45%. The  left ventricle has mildly decreased function. The left ventricle  demonstrates global hypokinesis. There is moderate left  ventricular  hypertrophy. Left ventricular diastolic  parameters are consistent with Grade I diastolic dysfunction (impaired  relaxation). Elevated left ventricular end-diastolic pressure.   2. Right ventricular systolic function is normal. The right ventricular  size is normal.   3. Left atrial size was mildly dilated.   4. The trivial pericardial effusion is posterior to the left ventricle.   5. The mitral valve is grossly normal. Trivial mitral valve  regurgitation.   6. The aortic valve is tricuspid. Aortic valve regurgitation is not  visualized.   7. The inferior vena cava is normal in size with greater than 50%  respiratory variability, suggesting right atrial pressure of 3 mmHg.   Comparison(s): Changes from prior study are noted. 02/05/2019: LVEF 55-60%.   FINDINGS   Left Ventricle: Left ventricular ejection fraction, by estimation, is 40  to 45%. The left ventricle has mildly decreased function. The left  ventricle demonstrates global hypokinesis. The left ventricular internal  cavity size was normal in size. There is   moderate left ventricular hypertrophy. Left ventricular diastolic  parameters are  consistent with Grade I diastolic dysfunction (impaired  relaxation). Elevated left ventricular end-diastolic pressure.   Right Ventricle: The right ventricular size is normal. No increase in  right ventricular wall thickness. Right ventricular systolic function is  normal.   Left Atrium: Left atrial size was mildly dilated.   Right Atrium: Right atrial size was normal in size.   Pericardium: Trivial pericardial effusion is present. The pericardial  effusion is posterior to the left ventricle.   Mitral Valve: The mitral valve is grossly normal. Trivial mitral valve  regurgitation.   Tricuspid Valve: The tricuspid valve is grossly normal. Tricuspid valve  regurgitation is trivial.   Aortic Valve: The aortic valve is tricuspid. Aortic valve regurgitation is  not visualized.   Pulmonic Valve: The pulmonic valve was not well visualized. Pulmonic valve  regurgitation is not visualized.   Aorta: The aortic root and ascending aorta are structurally normal, with  no evidence of dilitation.   Venous: The inferior vena cava is normal in size with greater than 50%  respiratory variability, suggesting right atrial pressure of 3 mmHg.   IAS/Shunts: No atrial level shunt detected by color flow Doppler.    01/15/2021 LHC  Dist RCA lesion is 25% stenosed. RPDA lesion is 25% stenosed. Mid LM to Dist LM lesion is 25% stenosed. 2nd Diag lesion is 25% stenosed. Ost LAD to Prox LAD lesion is 25% stenosed. Mid LAD-1 lesion is 50% stenosed. Mid LAD-2 lesion is 25% stenosed. Ost Cx to Prox Cx lesion is 25% stenosed. 2nd Mrg lesion is 99% stenosed. A drug-eluting stent was successfully placed using a STENT RESOLUTE ONYX 3.0X22, postdilated to 3.75 in the proximal and mid stent, optimized with IVUS. Post intervention, there is a 0% residual stenosis. The left ventricular ejection fraction is 45-50% by visual estimate. There is mild left ventricular systolic dysfunction. LV end diastolic  pressure is normal. There is no aortic valve stenosis.  Laboratory Data:  High Sensitivity Troponin:  No results for input(s): "TROPONINIHS" in the last 720 hours.    ChemistryNo results for input(s): "NA", "K", "CL", "CO2", "GLUCOSE", "BUN", "CREATININE", "CALCIUM", "MG", "GFRNONAA", "GFRAA", "ANIONGAP" in the last 168 hours.  No results for input(s): "PROT", "ALBUMIN", "AST", "ALT", "ALKPHOS", "BILITOT" in the last 168 hours. Lipids No results for input(s): "CHOL", "TRIG", "HDL", "LABVLDL", "LDLCALC", "CHOLHDL" in the last 168 hours. HematologyNo results for input(s): "WBC", "RBC", "HGB", "HCT", "MCV", "MCH", "MCHC", "RDW", "PLT"  in the last 168 hours. Thyroid No results for input(s): "TSH", "FREET4" in the last 168 hours. BNPNo results for input(s): "BNP", "PROBNP" in the last 168 hours.  DDimer No results for input(s): "DDIMER" in the last 168 hours.   Radiology/Studies:  No results found.   Assessment and Plan:   Anterior STEMI  Patient brought directly to the cath lab by [facility] for urgent LHC in setting of anterior STEMI by ECG.  LHC with Dr. Lynnette Caffey DAPT vs IV P2Y12 to be determined by LHC findings/intervention Continue ASA 81mg  QD. Plan to continue beta blocker, likely Coreg 6.25mg  BID Echocardiogram ordered  Hypertension  Home med lists shows Amlodipine 10mg , Coreg 6.25mg  BID, Lisinopril 40mg  QD. Will resume as able following emergent LHC with hemodynamic stability.  Hyperlipidemia  Continue Atorvastatin 80mg . Check lipid panel and lipoprotein A.  DM type II  SSI while inpatient.   Risk Assessment/Risk Scores:    TIMI Risk Score for ST  Elevation MI:   The patient's TIMI risk score is  , which indicates a  % risk of all cause mortality at 30 days.        Severity of Illness: The appropriate patient status for this patient is INPATIENT. Inpatient status is judged to be reasonable and necessary in order to provide the required intensity of  service to ensure the patient's safety. The patient's presenting symptoms, physical exam findings, and initial radiographic and laboratory data in the context of their chronic comorbidities is felt to place them at high risk for further clinical deterioration. Furthermore, it is not anticipated that the patient will be medically stable for discharge from the hospital within 2 midnights of admission.   * I certify that at the point of admission it is my clinical judgment that the patient will require inpatient hospital care spanning beyond 2 midnights from the point of admission due to high intensity of service, high risk for further deterioration and high frequency of surveillance required.*    For questions or updates, please contact [facility] Please consult [email or web address] for contact info under     Signed, Perlie Gold, PA-C  04/11/2023 4:40 PM    ATTENDING ATTESTATION:  After conducting a review of all available clinical information with the care team, interviewing the patient, and performing a physical exam, I agree with the findings and plan described in this note.   GEN: No acute distress.   HEENT:  MMM, no JVD, no scleral icterus Cardiac: RRR, no murmurs, rubs, or gallops.  Respiratory: Clear to auscultation bilaterally. GI: Soft, nontender, non-distended  MS: No edema; No deformity. Neuro:  Nonfocal  Vasc:  +2 radial pulses  The patient is a 67 year old male with a history of coronary artery disease status post stenting of the second obtuse marginal, hypertension, hyperlipidemia, type 2 diabetes who presents with ongoing chest pain.  The patient first developed chest pain 2 nights ago.  It resolved but then recurred today.  His EKG demonstrates an evolving anterior ST elevation myocardial infarction with associated Q waves.  The patient will be referred for emergency coronary angiography and possible percutaneous coronary intervention.  Further recommendations will be  issued following the results of this testing.  Alverda Skeans, MD Pager [phone]

## 2023-04-11 NOTE — Progress Notes (Signed)
   04/11/23 1659  Spiritual Encounters  Type of Visit Attempt (pt unavailable)  Referral source Code page  Reason for visit Code  OnCall Visit No   Chaplain responded to code STEMI. Patient was taken to the cathlab and no family was present.   Arlyce Dice, Chaplain Resident [phone]

## 2023-04-12 ENCOUNTER — Inpatient Hospital Stay (HOSPITAL_COMMUNITY): Payer: Medicaid Other

## 2023-04-12 ENCOUNTER — Other Ambulatory Visit (HOSPITAL_COMMUNITY): Payer: Medicaid Other

## 2023-04-12 DIAGNOSIS — I10 Essential (primary) hypertension: Secondary | ICD-10-CM

## 2023-04-12 DIAGNOSIS — I2102 ST elevation (STEMI) myocardial infarction involving left anterior descending coronary artery: Principal | ICD-10-CM

## 2023-04-12 DIAGNOSIS — E118 Type 2 diabetes mellitus with unspecified complications: Secondary | ICD-10-CM

## 2023-04-12 DIAGNOSIS — I213 ST elevation (STEMI) myocardial infarction of unspecified site: Secondary | ICD-10-CM

## 2023-04-12 DIAGNOSIS — E782 Mixed hyperlipidemia: Secondary | ICD-10-CM | POA: Diagnosis not present

## 2023-04-12 DIAGNOSIS — I2510 Atherosclerotic heart disease of native coronary artery without angina pectoris: Secondary | ICD-10-CM | POA: Diagnosis not present

## 2023-04-12 LAB — ECHOCARDIOGRAM COMPLETE
AR max vel: 3.38 cm2
AV Area VTI: 3.5 cm2
AV Area mean vel: 3.39 cm2
AV Mean grad: 4 mmHg
AV Peak grad: 7.1 mmHg
Ao pk vel: 1.34 m/s
Area-P 1/2: 4.04 cm2
Calc EF: 48.3 %
MV VTI: 4.67 cm2
S' Lateral: 4.7 cm
Single Plane A2C EF: 49.4 %
Single Plane A4C EF: 45.8 %
Weight: 4162.28 oz

## 2023-04-12 LAB — CBC
HCT: 40 % (ref 39.0–52.0)
Hemoglobin: 14.7 g/dL (ref 13.0–17.0)
MCH: 31.1 pg (ref 26.0–34.0)
MCHC: 36.8 g/dL — ABNORMAL HIGH (ref 30.0–36.0)
MCV: 84.7 fL (ref 80.0–100.0)
Platelets: 279 10*3/uL (ref 150–400)
RBC: 4.72 MIL/uL (ref 4.22–5.81)
RDW: 13.9 % (ref 11.5–15.5)
WBC: 17.8 10*3/uL — ABNORMAL HIGH (ref 4.0–10.5)
nRBC: 0 % (ref 0.0–0.2)

## 2023-04-12 LAB — BASIC METABOLIC PANEL
Anion gap: 12 (ref 5–15)
BUN: 17 mg/dL (ref 8–23)
CO2: 22 mmol/L (ref 22–32)
Calcium: 8.3 mg/dL — ABNORMAL LOW (ref 8.9–10.3)
Chloride: 97 mmol/L — ABNORMAL LOW (ref 98–111)
Creatinine, Ser: 1.06 mg/dL (ref 0.61–1.24)
GFR, Estimated: >60 mL/min (ref >60)
Glucose, Bld: 179 mg/dL — ABNORMAL HIGH (ref 70–99)
Potassium: 3.3 mmol/L — ABNORMAL LOW (ref 3.5–5.1)
Sodium: 131 mmol/L — ABNORMAL LOW (ref 135–145)

## 2023-04-12 LAB — HEPARIN LEVEL (UNFRACTIONATED)
Heparin Unfractionated: 0.17 IU/mL — ABNORMAL LOW (ref 0.30–0.70)
Heparin Unfractionated: 0.24 IU/mL — ABNORMAL LOW (ref 0.30–0.70)

## 2023-04-12 LAB — GLUCOSE, CAPILLARY
Glucose-Capillary: 184 mg/dL — ABNORMAL HIGH (ref 70–99)
Glucose-Capillary: 236 mg/dL — ABNORMAL HIGH (ref 70–99)
Glucose-Capillary: 196 mg/dL — ABNORMAL HIGH (ref 70–99)
Glucose-Capillary: 194 mg/dL — ABNORMAL HIGH (ref 70–99)

## 2023-04-12 LAB — TROPONIN I (HIGH SENSITIVITY): Troponin I (High Sensitivity): 813 ng/L (ref <18)

## 2023-04-12 MED ORDER — INSULIN GLARGINE-YFGN 100 UNIT/ML ~~LOC~~ SOLN
10.0000 [IU] | Freq: Every day | SUBCUTANEOUS | Status: DC
  Administered 2023-04-12 – 2023-04-13 (×2): 10 [IU] via SUBCUTANEOUS
  Filled 2023-04-12 (×3): qty 0.1

## 2023-04-12 MED ORDER — POTASSIUM CHLORIDE CRYS ER 20 MEQ PO TBCR
40.0000 meq | EXTENDED_RELEASE_TABLET | Freq: Once | ORAL | Status: DC
  Filled 2023-04-12: qty 2

## 2023-04-12 MED ORDER — ASPIRIN 300 MG RE SUPP: 300.0000 mg | Freq: Every day | RECTAL | Status: DC

## 2023-04-12 MED ORDER — POTASSIUM CHLORIDE 10 MEQ/100ML IV SOLN
10.0000 meq | INTRAVENOUS | Status: AC
  Administered 2023-04-12 (×4): 10 meq via INTRAVENOUS
  Filled 2023-04-12 (×4): qty 100

## 2023-04-12 MED ORDER — SPIRONOLACTONE 12.5 MG HALF TABLET
12.5000 mg | ORAL_TABLET | Freq: Every day | ORAL | Status: DC
  Administered 2023-04-12 – 2023-04-13 (×2): 12.5 mg via ORAL
  Filled 2023-04-12 (×2): qty 1

## 2023-04-12 MED ORDER — ASPIRIN 81 MG PO CHEW
81.0000 mg | CHEWABLE_TABLET | Freq: Every day | ORAL | Status: DC
  Administered 2023-04-12 – 2023-04-14 (×3): 81 mg via ORAL
  Filled 2023-04-12 (×3): qty 1

## 2023-04-12 MED ORDER — POTASSIUM CHLORIDE 10 MEQ/100ML IV SOLN: 10.0000 meq | Freq: Once | INTRAVENOUS | Status: AC

## 2023-04-12 MED ORDER — POTASSIUM CHLORIDE CRYS ER 20 MEQ PO TBCR
40.0000 meq | EXTENDED_RELEASE_TABLET | Freq: Once | ORAL | Status: AC
  Administered 2023-04-12: 40 meq via ORAL
  Filled 2023-04-12: qty 2

## 2023-04-12 MED ORDER — LOSARTAN POTASSIUM 25 MG PO TABS
12.5000 mg | ORAL_TABLET | Freq: Every day | ORAL | Status: DC
  Administered 2023-04-12: 12.5 mg via ORAL
  Filled 2023-04-12: qty 1

## 2023-04-12 MED ORDER — PERFLUTREN LIPID MICROSPHERE
1.0000 mL | INTRAVENOUS | Status: AC | PRN
  Administered 2023-04-12: 3 mL via INTRAVENOUS

## 2023-04-12 MED FILL — Lidocaine HCl Local Preservative Free (PF) Inj 1%: INTRAMUSCULAR | Qty: 30 | Status: AC

## 2023-04-12 NOTE — Progress Notes (Addendum)
Pt vomited green yellow bile after taking 1/4 PO potassium replacement. Pt did not eat lunch and has felt nauseated today. Pt HR peaked at 120's nonsustained while vomiting

## 2023-04-12 NOTE — H&P (View-Only) (Signed)
Advanced Heart Failure Team Consult Note   Primary Physician: Ailene Ravel, MD PCP-Cardiologist:  Lance Muss, MD  Reason for Consultation: Acute on chronic systolic CHF  HPI:    Jacob Gonzales is seen today for evaluation of acute on chronic systolic CHF at the request of Dr. Laneta Simmers with [facility]. 67 y.o. 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   Presented to [facility] 04/11/23 with anterior STEMI. LHC: High grade d LM, ostial LAD (treated with balloon angioplasty), ostial D1 and ostial ramus disease. Given severity of disease, surgical revascularization recommended. He was seen by [facility] and plan is for CABG later this week.  LVEDP 31 on cath. Given 20 mg lasix IV and now on 40 mg lasix IV BID.   Echo: EF 25-30%, WMA consistent with LAD territory infarct, RV okay  Advanced heart failure asked to see to optimize his volume status prior to surgery.  No recurrent CP. + orthopnea.   He owns 2 farms in the area. No tobacco use, consumes ETOH on occasion.   Home Medications Prior to Admission medications   Medication Sig Start Date End Date Taking? Authorizing Provider  amLODipine (NORVASC) 10 MG tablet Take 10 mg by mouth daily. for high blood pressure 02/16/19  Yes [provider]  atorvastatin (LIPITOR) 80 MG tablet Take 1 tablet (80 mg total) by mouth daily. 01/17/21  Yes Evlyn Kanner, MD  carvedilol (COREG) 6.25 MG tablet Take 1 tablet (6.25 mg total) by mouth 2 (two) times daily with a meal. 01/16/21  Yes Evlyn Kanner, MD  ezetimibe (ZETIA) 10 MG tablet Take 10 mg by mouth daily.   Yes [provider]  hydrochlorothiazide (HYDRODIURIL) 12.5 MG tablet Take 12.5 mg by mouth daily.   Yes [provider]  hydrochlorothiazide (HYDRODIURIL) 25 MG tablet Take 25 mg by mouth daily.   Yes [provider]  lisinopril (ZESTRIL) 40 MG tablet Take 1 tablet (40  mg total) by mouth daily. 01/17/21  Yes Evlyn Kanner, MD  tamsulosin (FLOMAX) 0.4 MG CAPS capsule Take 0.4 mg by mouth daily after supper.   Yes [provider]  TRULICITY 1.5 MG/0.5ML SOPN Inject 1 mg into the skin daily. 03/25/21  Yes [provider]  aspirin 81 MG chewable tablet Chew 1 tablet (81 mg total) by mouth daily. Patient not taking: Reported on 04/12/2023 07/12/15   Altamese Dilling, MD  dapagliflozin propanediol (FARXIGA) 5 MG TABS tablet Take 1 tablet (5 mg total) by mouth daily. Patient not taking: Reported on 04/12/2023 01/17/21   Evlyn Kanner, MD  HYDROcodone-acetaminophen [facility]) 5-325 MG tablet Take 2 tablets by mouth every 4 (four) hours as needed. Patient not taking: Reported on 04/12/2023 01/15/21   Geoffery Lyons, MD  nitroGLYCERIN (NITROSTAT) 0.4 MG SL tablet Place 1 tablet (0.4 mg total) under the tongue every 5 (five) minutes as needed for chest pain (CP or SOB). Patient not taking: Reported on 04/12/2023 01/16/21   Evlyn Kanner, MD  ticagrelor (BRILINTA) 90 MG TABS tablet Take 1 tablet (90 mg total) by mouth 2 (two) times daily. Patient not taking: Reported on 04/12/2023 01/16/21   Azalee Course, PA    Past 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   Arthritis    Bilateral shoulders, Hands, Knees   Bone cancer (HCC) 2016   Brain cancer (HCC) 2016  Diabetes (HCC) 11/17/2015   HTN (hypertension) 11/17/2015   NSTEMI (non-ST elevated myocardial infarction) [facility])     Past Surgical History: Past Surgical History:  Procedure Laterality Date   CORONARY BALLOON ANGIOPLASTY N/A 04/11/2023   Procedure: CORONARY BALLOON ANGIOPLASTY;  Surgeon: Orbie Pyo, MD;  Location: [facility] INVASIVE CV LAB;  Service: Cardiovascular;  Laterality: N/A;   CORONARY STENT INTERVENTION N/A 01/15/2021   Procedure: CORONARY STENT INTERVENTION;  Surgeon: Corky Crafts, MD;   Location: [facility] INVASIVE CV LAB;  Service: Cardiovascular;  Laterality: N/A;   CORONARY ULTRASOUND/IVUS N/A 01/15/2021   Procedure: Intravascular Ultrasound/IVUS;  Surgeon: Corky Crafts, MD;  Location: [facility] INVASIVE CV LAB;  Service: Cardiovascular;  Laterality: N/A;   CORONARY/GRAFT ACUTE MI REVASCULARIZATION N/A 04/11/2023   Procedure: Coronary/Graft Acute MI Revascularization;  Surgeon: Orbie Pyo, MD;  Location: [facility] INVASIVE CV LAB;  Service: Cardiovascular;  Laterality: N/A;   LEFT HEART CATH AND CORONARY ANGIOGRAPHY N/A 01/15/2021   Procedure: LEFT HEART CATH AND CORONARY ANGIOGRAPHY;  Surgeon: Corky Crafts, MD;  Location: [facility] INVASIVE CV LAB;  Service: Cardiovascular;  Laterality: N/A;   LEFT HEART CATH AND CORONARY ANGIOGRAPHY N/A 04/11/2023   Procedure: LEFT HEART CATH AND CORONARY ANGIOGRAPHY;  Surgeon: Orbie Pyo, MD;  Location: [facility] INVASIVE CV LAB;  Service: Cardiovascular;  Laterality: N/A;   NO PAST SURGERIES      Family History: Family History  Problem Relation Age of Onset   Heart attack Mother    Heart attack Father    Diabetes Mellitus II Father    Kidney failure Father    Uterine cancer Sister    Heart attack Brother    Heart attack Maternal Aunt    Heart attack Maternal Uncle    Heart attack Paternal Aunt    Heart attack Paternal Uncle    Stroke Neg Hx     Social History: Social History   Socioeconomic History   Marital status: Married    Spouse name: Not on file   Number of children: 3   Years of education: Not on file   Highest education level: Not on file  Occupational History   Not on file  Tobacco Use   Smoking status: Never   Smokeless tobacco: Never  Vaping Use   Vaping Use: Never used  Substance and Sexual Activity   Alcohol use: Yes   Drug use: No   Sexual activity: Not on file  Other Topics Concern   Not on file  Social History Narrative   Lives at home with wife   Caffeine use: Drinks soda (2-12packs per week)   Social  Determinants of Health   Financial Resource Strain: Not on file  Food Insecurity: Not on file  Transportation Needs: Not on file  Physical Activity: Not on file  Stress: Not on file  Social Connections: Not on file    Allergies:  No Known Allergies  Objective:    Vital Signs:   Temp:  [97.6 F (36.4 C)-99.2 F (37.3 C)] 99.2 F (37.3 C) (05/08 1115) Pulse Rate:  [0-131] 98 (05/08 1400) Resp:  [15-33] 26 (05/08 1400) BP: (102-197)/(65-136) 128/67 (05/08 1400) SpO2:  [80 %-99 %] 90 % (05/08 1400) Weight:  [409 kg] 118 kg (05/07 1629) Last BM Date :  (PTA)  Weight change: Filed Weights   04/11/23 1629  Weight: 118 kg    Intake/Output:   Intake/Output Summary (Last 24 hours) at 04/12/2023 1452 Last data filed at 04/12/2023 1200 Gross per 24  hour  Intake 1675.98 ml  Output 2280 ml  Net -604.02 ml      Physical Exam    General:  Well appearing.  HEENT: normal Neck: supple. JVP difficult d/t neck size. Carotids 2+ bilat; no bruits.  Cor: PMI nondisplaced. Regular rate & rhythm, tachy. No rubs, gallops or murmurs. Lungs: clear Abdomen: obese, soft, nontender, nondistended.  Extremities: no cyanosis, clubbing, rash, trace edema Neuro: alert & orientedx3, cranial nerves grossly intact. Affect pleasant   Telemetry   SR/ST 90s-100s  EKG    ECG today: SR 97 bpm, less pronounced ST elevation anteroseptal leads, anterior Qs  Labs   Basic Metabolic Panel: Recent Labs  Lab 04/11/23 1650 04/11/23 1653 04/11/23 1737 04/12/23 0549  NA 135 138 135 131*  K 3.4* 3.6 3.9 3.3*  CL 101 102  --  97*  CO2 21*  --   --  22  GLUCOSE 227* 233*  --  179*  BUN 10 13  --  17  CREATININE 1.01 1.00  --  1.06  CALCIUM 8.5*  --   --  8.3*    Liver Function Tests: Recent Labs  Lab 04/11/23 1650  AST 24  ALT 19  ALKPHOS 77  BILITOT 1.0  PROT 7.4  ALBUMIN 3.6   No results for input(s): "LIPASE", "AMYLASE" in the last 168 hours. No results for input(s): "AMMONIA" in  the last 168 hours.  CBC: Recent Labs  Lab 04/11/23 1650 04/11/23 1653 04/11/23 1737 04/12/23 0549  WBC 17.0*  --   --  17.8*  NEUTROABS 11.1*  --   --   --   HGB 16.2 16.3 15.0 14.7  HCT 46.6 48.0 44.0 40.0  MCV 87.3  --   --  84.7  PLT 311  --   --  279    Cardiac Enzymes: No results for input(s): "CKTOTAL", "CKMB", "CKMBINDEX", "TROPONINI" in the last 168 hours.  BNP: BNP (last 3 results) No results for input(s): "BNP" in the last 8760 hours.  ProBNP (last 3 results) No results for input(s): "PROBNP" in the last 8760 hours.   CBG: Recent Labs  Lab 04/11/23 2014 04/11/23 2151 04/12/23 0658 04/12/23 1106  GLUCAP 284* 278* 184* 236*    Coagulation Studies: Recent Labs    04/11/23 1650  LABPROT 15.8*  INR 1.2     Imaging   ECHOCARDIOGRAM COMPLETE  Result Date: 04/12/2023    ECHOCARDIOGRAM REPORT   Patient Name:   AMANTE KOSEK Date of Exam: 04/12/2023 Medical Rec #:  [id number]   Height:       70.0 in Accession #:    [id number]  Weight:       260.1 lb Date of Birth:  September 24, 1956  BSA:          2.334 m Patient Age:    66 years    BP:           129/65 mmHg Patient Gender: M           HR:           97 bpm. Exam Location:  Inpatient Procedure: 2D Echo, Cardiac Doppler, Color Doppler and Intracardiac            Opacification Agent Indications:    Acute myocardial infarction  History:        Patient has prior history of Echocardiogram examinations, most                 recent 01/15/2021. Acute MI, Stroke; Risk  Factors:Diabetes,                 Hypertension and Dyslipidemia.  Sonographer:    Wallie Char Referring Phys: [id number] Orbie Pyo  Sonographer Comments: Image acquisition challenging due to patient body habitus. IMPRESSIONS  1. Left ventricular ejection fraction, by estimation, is 25 to 30%. The left ventricle has severely decreased function. The left ventricle demonstrates regional wall motion abnormalities with akinesis of the mid to apical anterior, anteroseptal,  and inferoseptal walls as well as the true apex. No LV thrombus visualized. Consistent with LAD territory infarction. The left ventricular internal cavity size was mildly dilated. There is mild concentric left ventricular hypertrophy. Left ventricular diastolic parameters are consistent with Grade I diastolic dysfunction (impaired relaxation).  2. Peak RV-RA gradient 21 mmHg. IVC not visualized. Right ventricular systolic function is normal. The right ventricular size is normal.  3. The mitral valve is normal in structure. No evidence of mitral valve regurgitation. No evidence of mitral stenosis.  4. The aortic valve is tricuspid. Aortic valve regurgitation is not visualized. No aortic stenosis is present.  5. A small pericardial effusion is present. FINDINGS  Left Ventricle: Left ventricular ejection fraction, by estimation, is 25 to 30%. The left ventricle has severely decreased function. The left ventricle demonstrates regional wall motion abnormalities. Definity contrast agent was given IV to delineate the left ventricular endocardial borders. The left ventricular internal cavity size was mildly dilated. There is mild concentric left ventricular hypertrophy. Left ventricular diastolic parameters are consistent with Grade I diastolic dysfunction (impaired relaxation). Right Ventricle: Peak RV-RA gradient 21 mmHg. IVC not visualized. The right ventricular size is normal. No increase in right ventricular wall thickness. Right ventricular systolic function is normal. Left Atrium: Left atrial size was normal in size. Right Atrium: Right atrial size was normal in size. Pericardium: A small pericardial effusion is present. Mitral Valve: The mitral valve is normal in structure. No evidence of mitral valve regurgitation. No evidence of mitral valve stenosis. MV peak gradient, 4.1 mmHg. The mean mitral valve gradient is 2.0 mmHg. Tricuspid Valve: The tricuspid valve is normal in structure. Tricuspid valve regurgitation is  trivial. Aortic Valve: The aortic valve is tricuspid. Aortic valve regurgitation is not visualized. No aortic stenosis is present. Aortic valve mean gradient measures 4.0 mmHg. Aortic valve peak gradient measures 7.1 mmHg. Aortic valve area, by VTI measures 3.50 cm. Pulmonic Valve: The pulmonic valve was normal in structure. Pulmonic valve regurgitation is not visualized. Aorta: The aortic root is normal in size and structure. IAS/Shunts: No atrial level shunt detected by color flow Doppler.  LEFT VENTRICLE PLAX 2D LVIDd:         6.00 cm      Diastology LVIDs:         4.70 cm      LV e' medial:    6.19 cm/s LV PW:         1.20 cm      LV E/e' medial:  11.8 LV IVS:        1.00 cm      LV e' lateral:   6.89 cm/s LVOT diam:     2.30 cm      LV E/e' lateral: 10.6 LV SV:         81 LV SV Index:   35 LVOT Area:     4.15 cm  LV Volumes (MOD) LV vol d, MOD A2C: 146.0 ml LV vol d, MOD A4C: 192.0 ml LV vol s,  MOD A2C: 73.9 ml LV vol s, MOD A4C: 104.0 ml LV SV MOD A2C:     72.1 ml LV SV MOD A4C:     192.0 ml LV SV MOD BP:      82.6 ml RIGHT VENTRICLE RV Basal diam:  2.90 cm RV S prime:     11.20 cm/s TAPSE (M-mode): 2.4 cm LEFT ATRIUM             Index        RIGHT ATRIUM           Index LA diam:        3.70 cm 1.59 cm/m   RA Area:     11.20 cm LA Vol (A2C):   35.5 ml 15.21 ml/m  RA Volume:   18.40 ml  7.88 ml/m LA Vol (A4C):   42.4 ml 18.17 ml/m LA Biplane Vol: 38.7 ml 16.58 ml/m  AORTIC VALVE AV Area (Vmax):    3.38 cm AV Area (Vmean):   3.39 cm AV Area (VTI):     3.50 cm AV Vmax:           133.50 cm/s AV Vmean:          97.850 cm/s AV VTI:            0.232 m AV Peak Grad:      7.1 mmHg AV Mean Grad:      4.0 mmHg LVOT Vmax:         108.50 cm/s LVOT Vmean:        79.900 cm/s LVOT VTI:          0.196 m LVOT/AV VTI ratio: 0.84  AORTA Ao Root diam: 3.40 cm Ao Asc diam:  3.80 cm MITRAL VALVE               TRICUSPID VALVE MV Area (PHT): 4.04 cm    TR Peak grad:   21.5 mmHg MV Area VTI:   4.67 cm    TR Vmax:         232.00 cm/s MV Peak grad:  4.1 mmHg MV Mean grad:  2.0 mmHg    SHUNTS MV Vmax:       1.01 m/s    Systemic VTI:  0.20 m MV Vmean:      74.5 cm/s   Systemic Diam: 2.30 cm MV Decel Time: 188 msec MV E velocity: 73.30 cm/s MV A velocity: 97.60 cm/s MV E/A ratio:  0.75 Dalton McleanMD Electronically signed by Wilfred Lacy Signature Date/Time: 04/12/2023/12:09:52 PM    Final    CARDIAC CATHETERIZATION  Addendum Date: 04/12/2023     Ramus lesion is 80% stenosed.   Ost Cx lesion is 65% stenosed.   1st Diag lesion is 80% stenosed.   Dist LM lesion is 90% stenosed.   Ost LAD lesion is 100% stenosed.   Dist LAD lesion is 30% stenosed.   Post intervention, there is a 95% residual stenosis. 1.  Ostial LAD occlusion treated with balloon angioplasty only.  This revealed high-grade distal left main, ostial LAD, ostial first diagonal, and ostial ramus disease.  There is moderate ostial left circumflex disease as well.  Given the totality of disease and patient's history of diabetes, surgical revascularization should be considered.  The patient was bolused with cangrelor and this infusion will be continued indefinitely as a bridge towards potential surgery.  Heparin infusion will be initiated once the TR band has been removed.  The patient did not receive any Plavix, Effient, or  ticagrelor.  The images were reviewed with Dr. Cliffton Asters. 2.  Highly elevated LVEDP of 31 mmHg; the patient received 20 mg of Lasix IV x 1 due to being Lasix nave.  Further diuresis will be pursued. Recommendation: Evaluation for surgical revascularization.  Intensive diuresis, cangrelor infusion, heparin infusion, and nitroglycerin infusion with the latter for elevated blood pressures.  Result Date: 04/12/2023   Ramus lesion is 80% stenosed.   Ost Cx lesion is 65% stenosed.   1st Diag lesion is 80% stenosed.   Dist LM lesion is 90% stenosed.   Ost LAD lesion is 90% stenosed.   Dist LAD lesion is 30% stenosed. 1.  Ostial LAD occlusion treated with  balloon angioplasty only.  This revealed high-grade distal left main, ostial LAD, ostial first diagonal, and ostial ramus disease.  There is moderate ostial left circumflex disease as well.  Given the totality of disease and patient's history of diabetes, surgical revascularization should be considered.  The patient was bolused with cangrelor and this infusion will be continued indefinitely as a bridge towards potential surgery.  Heparin infusion will be initiated once the TR band has been removed.  The patient did not receive any Plavix, Effient, or ticagrelor.  The images were reviewed with Dr. Cliffton Asters. 2.  Highly elevated LVEDP of 31 mmHg; the patient received 20 mg of Lasix IV x 1 due to being Lasix nave.  Further diuresis will be pursued. Recommendation: Evaluation for surgical revascularization.  Intensive diuresis, cangrelor infusion, heparin infusion, and nitroglycerin infusion with the latter for elevated blood pressures.     Medications:     Current Medications:  aspirin  81 mg Oral Daily   atorvastatin  80 mg Oral Daily   Chlorhexidine Gluconate Cloth  6 each Topical Daily   folic acid  1 mg Oral Daily   furosemide  40 mg Intravenous BID   insulin aspart  0-15 Units Subcutaneous TID WC   insulin glargine-yfgn  10 Units Subcutaneous Daily   multivitamin with minerals  1 tablet Oral Daily   sodium chloride flush  3 mL Intravenous Q12H   thiamine  100 mg Oral Daily    Infusions:  sodium chloride     cangrelor (KENGREAL) 50 mg in sodium chloride 0.9 % 250 mL (0.2 mg/mL) infusion 0.75 mcg/kg/min (04/12/23 1200)   heparin 1,600 Units/hr (04/12/23 1414)   nitroGLYCERIN 70 mcg/min (04/12/23 1200)   potassium chloride        Patient Profile   67 y.o. male with history of CAD and prior MI in 2022, HFmrEF, DM II, HTN, hx CVA. Admitted with anterior STEMI and acute on chronic systolic CHF.   Assessment/Plan   Acute on chronic systolic CHF/ICM: -EF 40-45% following MI in  2022 -Echo this admit EF 25-30%. In setting of anterior STEMI. Severe LM and LAD disease. Plan for CABG -Volume overloaded. LVEDP 31 at time of cath. Diuresing with IV lasix 40 BID. Only 1L UOP charted for the day but reports > 2L so far. Continue diuresis. Supp K. -Wean nitro gtt as able and add GDMT.  -Start low-dose losartan and spiro  2. Anterior STEMI/CAD -Hx NSTEMI in 2022 s/p PCI/DES to OM2 -Now presenting with anterior STEMI. High grade d LM, ostial LAD (treated with balloon angioplasty), ostial D1 and ostial ramus disease. -Going for CABG on 05/10 -Continue aspirin, heparin, cangrelor and atorvastatin -Weaning nitro gtt as tolerated. No recurrent angina.   3. Hypertension -Uncontrolled on presentation. Significantly improved -Weaning nitro and adding GDMT as  tolerated  4. DM II -A1c 8.0% -SSI + semglee  5. Hyperlipidemia -LDL 176, Lipoprotein a pending -Started Atorvastatin 80 -May benefit from PCSK-9 inhibitor as outpatient  Length of Stay: 1  Neya Creegan N, PA-C  04/12/2023, 2:52 PM  Advanced Heart Failure Team Pager [phone] (M-F; 7a - 5p)  Please contact [facility] for night-coverage after hours (4p -7a ) and weekends on [email or web address]

## 2023-04-12 NOTE — Progress Notes (Signed)
ANTICOAGULATION CONSULT NOTE  Pharmacy Consult for IV heparin Indication: CAD awaiting CABG  No Known Allergies  Patient Measurements: Weight: 118 kg (260 lb 2.3 oz) Heparin Dosing Weight: ~ 95 kg  Vital Signs: Temp: 98.4 F (36.9 C) (05/07 2300) Temp Source: Oral (05/07 2300) BP: 125/74 (05/08 0000) Pulse Rate: 91 (05/08 0000)  Labs: Recent Labs    04/11/23 1650 04/11/23 1653 04/11/23 1737 04/11/23 2012 04/12/23 0549  HGB 16.2 16.3 15.0  --  14.7  HCT 46.6 48.0 44.0  --  40.0  PLT 311  --   --   --  279  APTT >200*  --   --   --   --   LABPROT 15.8*  --   --   --   --   INR 1.2  --   --   --   --   HEPARINUNFRC  --   --   --   --  0.17*  CREATININE 1.01 1.00  --   --  1.06  TROPONINIHS 813*  --   --  >24,000*  --      Estimated Creatinine Clearance: 90.6 mL/min (by C-G formula based on SCr of 1.06 mg/dL).   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   Arthritis    Bilateral shoulders, Hands, Knees   Bone cancer (HCC) 2016   Brain cancer (HCC) 2016   Diabetes (HCC) 11/17/2015   HTN (hypertension) 11/17/2015   NSTEMI (non-ST elevated myocardial infarction) (HCC)     Medications:  Infusions:   sodium chloride     cangrelor (KENGREAL) 50 mg in sodium chloride 0.9 % 250 mL (0.2 mg/mL) infusion 0.75 mcg/kg/min (04/11/23 2201)   dexmedetomidine (PRECEDEX) IV infusion Stopped (04/11/23 2000)   heparin 1,300 Units/hr (04/11/23 2119)   nitroGLYCERIN 70 mcg/min (04/12/23 0342)    Assessment: 67 yo male admitted with STEMI, found with multivessel dx, pharmacy asked to begin IV Heparin gtt after TR band off.  Awaiting CABG Friday.  Heparin level subtherapeutic at 0.17. CBC stable.  No issues with infusion or bleeding noted.   Goal of Therapy:  Heparin level 0.3-0.7 units/ml Monitor platelets by anticoagulation protocol: Yes   Plan:  Increase IV heparin gtt to 1600  units/hr. Check heparin level in 6 hrs Daily heparin level and CBC.  Andreas Ohm, PharmD Pharmacy Resident  04/12/2023 6:28 AM

## 2023-04-12 NOTE — Progress Notes (Signed)
ANTICOAGULATION CONSULT NOTE  Pharmacy Consult for IV heparin Indication: CAD awaiting CABG  No Known Allergies  Patient Measurements: Weight: 118 kg (260 lb 2.3 oz) Heparin Dosing Weight: ~ 95 kg  Vital Signs: Temp: 99.2 F (37.3 C) (05/08 1115) Temp Source: Oral (05/08 1115) BP: 129/76 (05/08 1200) Pulse Rate: 97 (05/08 1200)  Labs: Recent Labs    04/11/23 1650 04/11/23 1653 04/11/23 1737 04/11/23 2012 04/12/23 0549  HGB 16.2 16.3 15.0  --  14.7  HCT 46.6 48.0 44.0  --  40.0  PLT 311  --   --   --  279  APTT >200*  --   --   --   --   LABPROT 15.8*  --   --   --   --   INR 1.2  --   --   --   --   HEPARINUNFRC  --   --   --   --  0.17*  CREATININE 1.01 1.00  --   --  1.06  TROPONINIHS 813*  --   --  >24,000*  --      Estimated Creatinine Clearance: 90.6 mL/min (by C-G formula based on SCr of 1.06 mg/dL).   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   Arthritis    Bilateral shoulders, Hands, Knees   Bone cancer (HCC) 2016   Brain cancer (HCC) 2016   Diabetes (HCC) 11/17/2015   HTN (hypertension) 11/17/2015   NSTEMI (non-ST elevated myocardial infarction) (HCC)     Medications:  Infusions:   sodium chloride     cangrelor (KENGREAL) 50 mg in sodium chloride 0.9 % 250 mL (0.2 mg/mL) infusion 0.75 mcg/kg/min (04/12/23 1200)   heparin 1,600 Units/hr (04/12/23 1200)   nitroGLYCERIN 70 mcg/min (04/12/23 1200)    Assessment: 68 yo male admitted with STEMI, found with multivessel dx, pharmacy asked to begin IV Heparin gtt after TR band off.  Awaiting CABG Friday.  Heparin level remains subtherapeutic at 0.24. CBC stable.  No issues with infusion or bleeding noted.   Goal of Therapy:  Heparin level 0.3-0.5 units/ml Monitor platelets by anticoagulation protocol: Yes   Plan:  Increase IV heparin gtt to 1800 units/hr. Daily heparin level and CBC.  Andreas Ohm,  PharmD Pharmacy Resident  04/12/2023 1:33 PM

## 2023-04-12 NOTE — Progress Notes (Signed)
Patient refusing oxygen at this time, states it bothers his nose. O2 sats in the upper 80s.

## 2023-04-12 NOTE — Consult Note (Signed)
  Advanced Heart Failure Team Consult Note   Primary Physician: Hamrick, Maura L, MD PCP-Cardiologist:  Jayadeep Varanasi, MD  Reason for Consultation: Acute on chronic systolic CHF  HPI:    Jacob Gonzales is seen today for evaluation of acute on chronic systolic CHF at the request of Dr. Bartle with [facility]. 66 y.o. 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   Presented to [facility] 04/11/23 with anterior STEMI. LHC: High grade d LM, ostial LAD (treated with balloon angioplasty), ostial D1 and ostial ramus disease. Given severity of disease, surgical revascularization recommended. He was seen by [facility] and plan is for CABG later this week.  LVEDP 31 on cath. Given 20 mg lasix IV and now on 40 mg lasix IV BID.   Echo: EF 25-30%, WMA consistent with LAD territory infarct, RV okay  Advanced heart failure asked to see to optimize his volume status prior to surgery.  No recurrent CP. + orthopnea.   He owns 2 farms in the area. No tobacco use, consumes ETOH on occasion.   Home Medications Prior to Admission medications   Medication Sig Start Date End Date Taking? Authorizing Provider  amLODipine (NORVASC) 10 MG tablet Take 10 mg by mouth daily. for high blood pressure 02/16/19  Yes [provider]  atorvastatin (LIPITOR) 80 MG tablet Take 1 tablet (80 mg total) by mouth daily. 01/17/21  Yes Braswell, Phillip, MD  carvedilol (COREG) 6.25 MG tablet Take 1 tablet (6.25 mg total) by mouth 2 (two) times daily with a meal. 01/16/21  Yes Braswell, Phillip, MD  ezetimibe (ZETIA) 10 MG tablet Take 10 mg by mouth daily.   Yes [provider]  hydrochlorothiazide (HYDRODIURIL) 12.5 MG tablet Take 12.5 mg by mouth daily.   Yes [provider]  hydrochlorothiazide (HYDRODIURIL) 25 MG tablet Take 25 mg by mouth daily.   Yes [provider]  lisinopril (ZESTRIL) 40 MG tablet Take 1 tablet (40  mg total) by mouth daily. 01/17/21  Yes Braswell, Phillip, MD  tamsulosin (FLOMAX) 0.4 MG CAPS capsule Take 0.4 mg by mouth daily after supper.   Yes [provider]  TRULICITY 1.5 MG/0.5ML SOPN Inject 1 mg into the skin daily. 03/25/21  Yes [provider]  aspirin 81 MG chewable tablet Chew 1 tablet (81 mg total) by mouth daily. Patient not taking: Reported on 04/12/2023 07/12/15   Vachhani, Vaibhavkumar, MD  dapagliflozin propanediol (FARXIGA) 5 MG TABS tablet Take 1 tablet (5 mg total) by mouth daily. Patient not taking: Reported on 04/12/2023 01/17/21   Braswell, Phillip, MD  HYDROcodone-acetaminophen (NORCO) 5-325 MG tablet Take 2 tablets by mouth every 4 (four) hours as needed. Patient not taking: Reported on 04/12/2023 01/15/21   Delo, Douglas, MD  nitroGLYCERIN (NITROSTAT) 0.4 MG SL tablet Place 1 tablet (0.4 mg total) under the tongue every 5 (five) minutes as needed for chest pain (CP or SOB). Patient not taking: Reported on 04/12/2023 01/16/21   Braswell, Phillip, MD  ticagrelor (BRILINTA) 90 MG TABS tablet Take 1 tablet (90 mg total) by mouth 2 (two) times daily. Patient not taking: Reported on 04/12/2023 01/16/21   Meng, Hao, PA    Past 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   Arthritis    Bilateral shoulders, Hands, Knees   Bone cancer (HCC) 2016   Brain cancer (HCC) 2016     Diabetes (HCC) 11/17/2015   HTN (hypertension) 11/17/2015   NSTEMI (non-ST elevated myocardial infarction) (HCC)     Past Surgical History: Past Surgical History:  Procedure Laterality Date   CORONARY BALLOON ANGIOPLASTY N/A 04/11/2023   Procedure: CORONARY BALLOON ANGIOPLASTY;  Surgeon: Thukkani, Arun K, MD;  Location: [facility];  Service: Cardiovascular;  Laterality: N/A;   CORONARY STENT INTERVENTION N/A 01/15/2021   Procedure: CORONARY STENT INTERVENTION;  Surgeon: Varanasi, Jayadeep S, MD;   Location: [facility];  Service: Cardiovascular;  Laterality: N/A;   CORONARY ULTRASOUND/IVUS N/A 01/15/2021   Procedure: Intravascular Ultrasound/IVUS;  Surgeon: Varanasi, Jayadeep S, MD;  Location: [facility];  Service: Cardiovascular;  Laterality: N/A;   CORONARY/GRAFT ACUTE MI REVASCULARIZATION N/A 04/11/2023   Procedure: Coronary/Graft Acute MI Revascularization;  Surgeon: Thukkani, Arun K, MD;  Location: [facility];  Service: Cardiovascular;  Laterality: N/A;   LEFT HEART CATH AND CORONARY ANGIOGRAPHY N/A 01/15/2021   Procedure: LEFT HEART CATH AND CORONARY ANGIOGRAPHY;  Surgeon: Varanasi, Jayadeep S, MD;  Location: [facility];  Service: Cardiovascular;  Laterality: N/A;   LEFT HEART CATH AND CORONARY ANGIOGRAPHY N/A 04/11/2023   Procedure: LEFT HEART CATH AND CORONARY ANGIOGRAPHY;  Surgeon: Thukkani, Arun K, MD;  Location: [facility];  Service: Cardiovascular;  Laterality: N/A;   NO PAST SURGERIES      Family History: Family History  Problem Relation Age of Onset   Heart attack Mother    Heart attack Father    Diabetes Mellitus II Father    Kidney failure Father    Uterine cancer Sister    Heart attack Brother    Heart attack Maternal Aunt    Heart attack Maternal Uncle    Heart attack Paternal Aunt    Heart attack Paternal Uncle    Stroke Neg Hx     Social History: Social History   Socioeconomic History   Marital status: Married    Spouse name: Not on file   Number of children: 3   Years of education: Not on file   Highest education level: Not on file  Occupational History   Not on file  Tobacco Use   Smoking status: Never   Smokeless tobacco: Never  Vaping Use   Vaping Use: Never used  Substance and Sexual Activity   Alcohol use: Yes   Drug use: No   Sexual activity: Not on file  Other Topics Concern   Not on file  Social History Narrative   Lives at home with wife   Caffeine use: Drinks soda (2-12packs per week)   Social  Determinants of Health   Financial Resource Strain: Not on file  Food Insecurity: Not on file  Transportation Needs: Not on file  Physical Activity: Not on file  Stress: Not on file  Social Connections: Not on file    Allergies:  No Known Allergies  Objective:    Vital Signs:   Temp:  [97.6 F (36.4 C)-99.2 F (37.3 C)] 99.2 F (37.3 C) (05/08 1115) Pulse Rate:  [0-131] 98 (05/08 1400) Resp:  [15-33] 26 (05/08 1400) BP: (102-197)/(65-136) 128/67 (05/08 1400) SpO2:  [80 %-99 %] 90 % (05/08 1400) Weight:  [118 kg] 118 kg (05/07 1629) Last BM Date :  (PTA)  Weight change: Filed Weights   04/11/23 1629  Weight: 118 kg    Intake/Output:   Intake/Output Summary (Last 24 hours) at 04/12/2023 1452 Last data filed at 04/12/2023 1200 Gross per 24   hour  Intake 1675.98 ml  Output 2280 ml  Net -604.02 ml      Physical Exam    General:  Well appearing.  HEENT: normal Neck: supple. JVP difficult d/t neck size. Carotids 2+ bilat; no bruits.  Cor: PMI nondisplaced. Regular rate & rhythm, tachy. No rubs, gallops or murmurs. Lungs: clear Abdomen: obese, soft, nontender, nondistended.  Extremities: no cyanosis, clubbing, rash, trace edema Neuro: alert & orientedx3, cranial nerves grossly intact. Affect pleasant   Telemetry   SR/ST 90s-100s  EKG    ECG today: SR 97 bpm, less pronounced ST elevation anteroseptal leads, anterior Qs  Labs   Basic Metabolic Panel: Recent Labs  Lab 04/11/23 1650 04/11/23 1653 04/11/23 1737 04/12/23 0549  NA 135 138 135 131*  K 3.4* 3.6 3.9 3.3*  CL 101 102  --  97*  CO2 21*  --   --  22  GLUCOSE 227* 233*  --  179*  BUN 10 13  --  17  CREATININE 1.01 1.00  --  1.06  CALCIUM 8.5*  --   --  8.3*    Liver Function Tests: Recent Labs  Lab 04/11/23 1650  AST 24  ALT 19  ALKPHOS 77  BILITOT 1.0  PROT 7.4  ALBUMIN 3.6   No results for input(s): "LIPASE", "AMYLASE" in the last 168 hours. No results for input(s): "AMMONIA" in  the last 168 hours.  CBC: Recent Labs  Lab 04/11/23 1650 04/11/23 1653 04/11/23 1737 04/12/23 0549  WBC 17.0*  --   --  17.8*  NEUTROABS 11.1*  --   --   --   HGB 16.2 16.3 15.0 14.7  HCT 46.6 48.0 44.0 40.0  MCV 87.3  --   --  84.7  PLT 311  --   --  279    Cardiac Enzymes: No results for input(s): "CKTOTAL", "CKMB", "CKMBINDEX", "TROPONINI" in the last 168 hours.  BNP: BNP (last 3 results) No results for input(s): "BNP" in the last 8760 hours.  ProBNP (last 3 results) No results for input(s): "PROBNP" in the last 8760 hours.   CBG: Recent Labs  Lab 04/11/23 2014 04/11/23 2151 04/12/23 0658 04/12/23 1106  GLUCAP 284* 278* 184* 236*    Coagulation Studies: Recent Labs    04/11/23 1650  LABPROT 15.8*  INR 1.2     Imaging   ECHOCARDIOGRAM COMPLETE  Result Date: 04/12/2023    ECHOCARDIOGRAM REPORT   Patient Name:   Jacob Gonzales Date of Exam: 04/12/2023 Medical Rec #:  [id number]   Height:       70.0 in Accession #:    [id number]  Weight:       260.1 lb Date of Birth:  11/06/1956  BSA:          2.334 m Patient Age:    66 years    BP:           129/65 mmHg Patient Gender: M           HR:           97 bpm. Exam Location:  Inpatient Procedure: 2D Echo, Cardiac Doppler, Color Doppler and Intracardiac            Opacification Agent Indications:    Acute myocardial infarction  History:        Patient has prior history of Echocardiogram examinations, most                 recent 01/15/2021. Acute MI, Stroke; Risk   Factors:Diabetes,                 Hypertension and Dyslipidemia.  Sonographer:    Molly Halloran Referring Phys: [id number] ARUN K THUKKANI  Sonographer Comments: Image acquisition challenging due to patient body habitus. IMPRESSIONS  1. Left ventricular ejection fraction, by estimation, is 25 to 30%. The left ventricle has severely decreased function. The left ventricle demonstrates regional wall motion abnormalities with akinesis of the mid to apical anterior, anteroseptal,  and inferoseptal walls as well as the true apex. No LV thrombus visualized. Consistent with LAD territory infarction. The left ventricular internal cavity size was mildly dilated. There is mild concentric left ventricular hypertrophy. Left ventricular diastolic parameters are consistent with Grade I diastolic dysfunction (impaired relaxation).  2. Peak RV-RA gradient 21 mmHg. IVC not visualized. Right ventricular systolic function is normal. The right ventricular size is normal.  3. The mitral valve is normal in structure. No evidence of mitral valve regurgitation. No evidence of mitral stenosis.  4. The aortic valve is tricuspid. Aortic valve regurgitation is not visualized. No aortic stenosis is present.  5. A small pericardial effusion is present. FINDINGS  Left Ventricle: Left ventricular ejection fraction, by estimation, is 25 to 30%. The left ventricle has severely decreased function. The left ventricle demonstrates regional wall motion abnormalities. Definity contrast agent was given IV to delineate the left ventricular endocardial borders. The left ventricular internal cavity size was mildly dilated. There is mild concentric left ventricular hypertrophy. Left ventricular diastolic parameters are consistent with Grade I diastolic dysfunction (impaired relaxation). Right Ventricle: Peak RV-RA gradient 21 mmHg. IVC not visualized. The right ventricular size is normal. No increase in right ventricular wall thickness. Right ventricular systolic function is normal. Left Atrium: Left atrial size was normal in size. Right Atrium: Right atrial size was normal in size. Pericardium: A small pericardial effusion is present. Mitral Valve: The mitral valve is normal in structure. No evidence of mitral valve regurgitation. No evidence of mitral valve stenosis. MV peak gradient, 4.1 mmHg. The mean mitral valve gradient is 2.0 mmHg. Tricuspid Valve: The tricuspid valve is normal in structure. Tricuspid valve regurgitation is  trivial. Aortic Valve: The aortic valve is tricuspid. Aortic valve regurgitation is not visualized. No aortic stenosis is present. Aortic valve mean gradient measures 4.0 mmHg. Aortic valve peak gradient measures 7.1 mmHg. Aortic valve area, by VTI measures 3.50 cm. Pulmonic Valve: The pulmonic valve was normal in structure. Pulmonic valve regurgitation is not visualized. Aorta: The aortic root is normal in size and structure. IAS/Shunts: No atrial level shunt detected by color flow Doppler.  LEFT VENTRICLE PLAX 2D LVIDd:         6.00 cm      Diastology LVIDs:         4.70 cm      LV e' medial:    6.19 cm/s LV PW:         1.20 cm      LV E/e' medial:  11.8 LV IVS:        1.00 cm      LV e' lateral:   6.89 cm/s LVOT diam:     2.30 cm      LV E/e' lateral: 10.6 LV SV:         81 LV SV Index:   35 LVOT Area:     4.15 cm  LV Volumes (MOD) LV vol d, MOD A2C: 146.0 ml LV vol d, MOD A4C: 192.0 ml LV vol s,   MOD A2C: 73.9 ml LV vol s, MOD A4C: 104.0 ml LV SV MOD A2C:     72.1 ml LV SV MOD A4C:     192.0 ml LV SV MOD BP:      82.6 ml RIGHT VENTRICLE RV Basal diam:  2.90 cm RV S prime:     11.20 cm/s TAPSE (M-mode): 2.4 cm LEFT ATRIUM             Index        RIGHT ATRIUM           Index LA diam:        3.70 cm 1.59 cm/m   RA Area:     11.20 cm LA Vol (A2C):   35.5 ml 15.21 ml/m  RA Volume:   18.40 ml  7.88 ml/m LA Vol (A4C):   42.4 ml 18.17 ml/m LA Biplane Vol: 38.7 ml 16.58 ml/m  AORTIC VALVE AV Area (Vmax):    3.38 cm AV Area (Vmean):   3.39 cm AV Area (VTI):     3.50 cm AV Vmax:           133.50 cm/s AV Vmean:          97.850 cm/s AV VTI:            0.232 m AV Peak Grad:      7.1 mmHg AV Mean Grad:      4.0 mmHg LVOT Vmax:         108.50 cm/s LVOT Vmean:        79.900 cm/s LVOT VTI:          0.196 m LVOT/AV VTI ratio: 0.84  AORTA Ao Root diam: 3.40 cm Ao Asc diam:  3.80 cm MITRAL VALVE               TRICUSPID VALVE MV Area (PHT): 4.04 cm    TR Peak grad:   21.5 mmHg MV Area VTI:   4.67 cm    TR Vmax:         232.00 cm/s MV Peak grad:  4.1 mmHg MV Mean grad:  2.0 mmHg    SHUNTS MV Vmax:       1.01 m/s    Systemic VTI:  0.20 m MV Vmean:      74.5 cm/s   Systemic Diam: 2.30 cm MV Decel Time: 188 msec MV E velocity: 73.30 cm/s MV A velocity: 97.60 cm/s MV E/A ratio:  0.75 Dalton McleanMD Electronically signed by Dalton McleanMD Signature Date/Time: 04/12/2023/12:09:52 PM    Final    CARDIAC CATHETERIZATION  Addendum Date: 04/12/2023     Ramus lesion is 80% stenosed.   Ost Cx lesion is 65% stenosed.   1st Diag lesion is 80% stenosed.   Dist LM lesion is 90% stenosed.   Ost LAD lesion is 100% stenosed.   Dist LAD lesion is 30% stenosed.   Post intervention, there is a 95% residual stenosis. 1.  Ostial LAD occlusion treated with balloon angioplasty only.  This revealed high-grade distal left main, ostial LAD, ostial first diagonal, and ostial ramus disease.  There is moderate ostial left circumflex disease as well.  Given the totality of disease and patient's history of diabetes, surgical revascularization should be considered.  The patient was bolused with cangrelor and this infusion will be continued indefinitely as a bridge towards potential surgery.  Heparin infusion will be initiated once the TR band has been removed.  The patient did not receive any Plavix, Effient, or   ticagrelor.  The images were reviewed with Dr. Lightfoot. 2.  Highly elevated LVEDP of 31 mmHg; the patient received 20 mg of Lasix IV x 1 due to being Lasix nave.  Further diuresis will be pursued. Recommendation: Evaluation for surgical revascularization.  Intensive diuresis, cangrelor infusion, heparin infusion, and nitroglycerin infusion with the latter for elevated blood pressures.  Result Date: 04/12/2023   Ramus lesion is 80% stenosed.   Ost Cx lesion is 65% stenosed.   1st Diag lesion is 80% stenosed.   Dist LM lesion is 90% stenosed.   Ost LAD lesion is 90% stenosed.   Dist LAD lesion is 30% stenosed. 1.  Ostial LAD occlusion treated with  balloon angioplasty only.  This revealed high-grade distal left main, ostial LAD, ostial first diagonal, and ostial ramus disease.  There is moderate ostial left circumflex disease as well.  Given the totality of disease and patient's history of diabetes, surgical revascularization should be considered.  The patient was bolused with cangrelor and this infusion will be continued indefinitely as a bridge towards potential surgery.  Heparin infusion will be initiated once the TR band has been removed.  The patient did not receive any Plavix, Effient, or ticagrelor.  The images were reviewed with Dr. Lightfoot. 2.  Highly elevated LVEDP of 31 mmHg; the patient received 20 mg of Lasix IV x 1 due to being Lasix nave.  Further diuresis will be pursued. Recommendation: Evaluation for surgical revascularization.  Intensive diuresis, cangrelor infusion, heparin infusion, and nitroglycerin infusion with the latter for elevated blood pressures.     Medications:     Current Medications:  aspirin  81 mg Oral Daily   atorvastatin  80 mg Oral Daily   Chlorhexidine Gluconate Cloth  6 each Topical Daily   folic acid  1 mg Oral Daily   furosemide  40 mg Intravenous BID   insulin aspart  0-15 Units Subcutaneous TID WC   insulin glargine-yfgn  10 Units Subcutaneous Daily   multivitamin with minerals  1 tablet Oral Daily   sodium chloride flush  3 mL Intravenous Q12H   thiamine  100 mg Oral Daily    Infusions:  sodium chloride     cangrelor (KENGREAL) 50 mg in sodium chloride 0.9 % 250 mL (0.2 mg/mL) infusion 0.75 mcg/kg/min (04/12/23 1200)   heparin 1,600 Units/hr (04/12/23 1414)   nitroGLYCERIN 70 mcg/min (04/12/23 1200)   potassium chloride        Patient Profile   66 y.o. male with history of CAD and prior MI in 2022, HFmrEF, DM II, HTN, hx CVA. Admitted with anterior STEMI and acute on chronic systolic CHF.   Assessment/Plan   Acute on chronic systolic CHF/ICM: -EF 40-45% following MI in  2022 -Echo this admit EF 25-30%. In setting of anterior STEMI. Severe LM and LAD disease. Plan for CABG -Volume overloaded. LVEDP 31 at time of cath. Diuresing with IV lasix 40 BID. Only 1L UOP charted for the day but reports > 2L so far. Continue diuresis. Supp K. -Wean nitro gtt as able and add GDMT.  -Start low-dose losartan and spiro  2. Anterior STEMI/CAD -Hx NSTEMI in 2022 s/p PCI/DES to OM2 -Now presenting with anterior STEMI. High grade d LM, ostial LAD (treated with balloon angioplasty), ostial D1 and ostial ramus disease. -Going for CABG on 05/10 -Continue aspirin, heparin, cangrelor and atorvastatin -Weaning nitro gtt as tolerated. No recurrent angina.   3. Hypertension -Uncontrolled on presentation. Significantly improved -Weaning nitro and adding GDMT as   tolerated  4. DM II -A1c 8.0% -SSI + semglee  5. Hyperlipidemia -LDL 176, Lipoprotein a pending -Started Atorvastatin 80 -May benefit from PCSK-9 inhibitor as outpatient  Length of Stay: 1  Xzaviar Maloof N, PA-C  04/12/2023, 2:52 PM  Advanced Heart Failure Team Pager [phone] (M-F; 7a - 5p)  Please contact [facility] for night-coverage after hours (4p -7a ) and weekends on [email or web address]  

## 2023-04-12 NOTE — Inpatient Diabetes Management (Signed)
Inpatient Diabetes Program Recommendations  AACE/ADA: New Consensus Statement on Inpatient Glycemic Control (2015)  Target Ranges:  Prepandial:   less than 140 mg/dL      Peak postprandial:   less than 180 mg/dL (1-2 hours)      Critically ill patients:  140 - 180 mg/dL   Lab Results  Component Value Date   GLUCAP 184 (H) 04/12/2023   HGBA1C 8.0 (H) 04/11/2023    Review of Glycemic Control  Latest Reference Range & Units 04/11/23 20:14 04/11/23 21:51 04/12/23 06:58  Glucose-Capillary 70 - 99 mg/dL 161 (H) 096 (H) 045 (H)   Diabetes history:  DM  2 Outpatient Diabetes medications:  Trulicity 1.5 mg weekly Farxiga 5 mg daily Current orders for Inpatient glycemic control:  Novolog 0-15 units tid with meals  Inpatient Diabetes Program Recommendations:    May consider adding Semglee 10 units daily while patient is in the hospital.   Thanks,  Beryl Meager, RN, BC-ADM Inpatient Diabetes Coordinator Pager [phone]  (8a-5p)

## 2023-04-12 NOTE — Progress Notes (Signed)
Patient ID: Jacob Gonzales, male   DOB: 06-05-1956, 66 y.o.   MRN: [id number] [facility]  Dr. Cliffton Asters asked me to see this patient to consider CABG. He had a large LAD territory infarct due to LAD occlusion with Troponin 813 increasing to >24000. Cath showed 90% distal LM stenosis with ostial LAD occlusion, 65% ostial LCX and 80% Ramus which is a small caliber vessel. There is a patent stent in the OM. The LAD was opened with PTCA but not stented. The distal vessel looks graftable but is smaller and has some distal disease. RCA has no significant stenosis. Echo today shows EF 25-30% with akinesis of the mid to apical anterior, anteroseptal, inferoseptal and apical walls. Q waves anteriorly on ECG. His LVEDP was 31 on cath. No RHC done. I asked AHF team to see him to help with decision about benefit of CABG and to help tune him up. I think it may be beneficial to do a cardiac MR viability study. If the LAD territory is all non-viable then it may be best to consider PCI of the LM into the LCX because surgical risk would be high given low EF, recent large infarct with volume overload, morbid obesity and poorly controlled DM. The ramus is relatively small and may not even be graftable in this gentleman. He has not had any chest pain today. I discussed this all with him and will talk to AHF team in the am.

## 2023-04-12 NOTE — Progress Notes (Addendum)
Rounding Note    Patient Name: Jacob Gonzales Date of Encounter: 04/12/2023  [facility] Cardiologist: Lance Muss, MD   Subjective   No chest pain like what he had prior to the angioplasty.  Does report an upset stomach after some pills he received yesterday which included thiamine and a multivitamin  Inpatient Medications    Scheduled Meds:  aspirin  81 mg Oral Daily   atorvastatin  80 mg Oral Daily   Chlorhexidine Gluconate Cloth  6 each Topical Daily   folic acid  1 mg Oral Daily   furosemide  40 mg Intravenous BID   insulin aspart  0-15 Units Subcutaneous TID WC   multivitamin with minerals  1 tablet Oral Daily   sodium chloride flush  3 mL Intravenous Q12H   thiamine  100 mg Oral Daily   Continuous Infusions:  sodium chloride     cangrelor (KENGREAL) 50 mg in sodium chloride 0.9 % 250 mL (0.2 mg/mL) infusion 0.75 mcg/kg/min (04/12/23 0800)   dexmedetomidine (PRECEDEX) IV infusion Stopped (04/11/23 2000)   heparin 1,600 Units/hr (04/12/23 0800)   nitroGLYCERIN 70 mcg/min (04/12/23 0800)   PRN Meds: sodium chloride, acetaminophen, LORazepam, ondansetron (ZOFRAN) IV, sodium chloride flush   Vital Signs    Vitals:   04/12/23 0435 04/12/23 0500 04/12/23 0600 04/12/23 0700  BP: 126/87 119/72 139/86 120/79  Pulse: 92 96 (!) 112   Resp:    (!) 21  Temp:    97.6 F (36.4 C)  TempSrc:    Oral  SpO2: 93% 91% 91%   Weight:        Intake/Output Summary (Last 24 hours) at 04/12/2023 0837 Last data filed at 04/12/2023 0800 Gross per 24 hour  Intake 1422.74 ml  Output 1600 ml  Net -177.26 ml      04/11/2023    4:29 PM 02/07/2023    1:07 AM 01/03/2022    4:18 PM  Last 3 Weights  Weight (lbs) 260 lb 2.3 oz 245 lb 245 lb  Weight (kg) 118 kg 111.131 kg 111.131 kg      Telemetry    Normal sinus rhythm- Personally Reviewed  ECG    Normal sinus rhythm, septal Q waves- Personally Reviewed  Physical Exam   GEN: No acute distress.   Neck: No  JVD Cardiac: RRR, no murmurs, rubs, or gallops.  Respiratory: Clear to auscultation bilaterally. GI: Soft, nontender, non-distended  MS: No edema; No deformity.  Right radial site stable, 2+ radial pulse without hematoma Neuro:  Nonfocal  Psych: Normal affect   Labs    High Sensitivity Troponin:   Recent Labs  Lab 04/11/23 1650 04/11/23 2012  TROPONINIHS 813* >24,000*     Chemistry Recent Labs  Lab 04/11/23 1650 04/11/23 1653 04/11/23 1737 04/12/23 0549  NA 135 138 135 131*  K 3.4* 3.6 3.9 3.3*  CL 101 102  --  97*  CO2 21*  --   --  22  GLUCOSE 227* 233*  --  179*  BUN 10 13  --  17  CREATININE 1.01 1.00  --  1.06  CALCIUM 8.5*  --   --  8.3*  PROT 7.4  --   --   --   ALBUMIN 3.6  --   --   --   AST 24  --   --   --   ALT 19  --   --   --   ALKPHOS 77  --   --   --  BILITOT 1.0  --   --   --   GFRNONAA >60  --   --  >60  ANIONGAP 13  --   --  12    Lipids  Recent Labs  Lab 04/11/23 1650  CHOL 227*  TRIG 111  HDL 29*  LDLCALC 176*  CHOLHDL 7.8    Hematology Recent Labs  Lab 04/11/23 1650 04/11/23 1653 04/11/23 1737 04/12/23 0549  WBC 17.0*  --   --  17.8*  RBC 5.34  --   --  4.72  HGB 16.2 16.3 15.0 14.7  HCT 46.6 48.0 44.0 40.0  MCV 87.3  --   --  84.7  MCH 30.3  --   --  31.1  MCHC 34.8  --   --  36.8*  RDW 13.7  --   --  13.9  PLT 311  --   --  279   Thyroid No results for input(s): "TSH", "FREET4" in the last 168 hours.  BNPNo results for input(s): "BNP", "PROBNP" in the last 168 hours.  DDimer No results for input(s): "DDIMER" in the last 168 hours.   Radiology    CARDIAC CATHETERIZATION  Addendum Date: 04/12/2023     Ramus lesion is 80% stenosed.   Ost Cx lesion is 65% stenosed.   1st Diag lesion is 80% stenosed.   Dist LM lesion is 90% stenosed.   Ost LAD lesion is 100% stenosed.   Dist LAD lesion is 30% stenosed.   Post intervention, there is a 95% residual stenosis. 1.  Ostial LAD occlusion treated with balloon angioplasty only.   This revealed high-grade distal left main, ostial LAD, ostial first diagonal, and ostial ramus disease.  There is moderate ostial left circumflex disease as well.  Given the totality of disease and patient's history of diabetes, surgical revascularization should be considered.  The patient was bolused with cangrelor and this infusion will be continued indefinitely as a bridge towards potential surgery.  Heparin infusion will be initiated once the TR band has been removed.  The patient did not receive any Plavix, Effient, or ticagrelor.  The images were reviewed with Dr. Cliffton Asters. 2.  Highly elevated LVEDP of 31 mmHg; the patient received 20 mg of Lasix IV x 1 due to being Lasix nave.  Further diuresis will be pursued. Recommendation: Evaluation for surgical revascularization.  Intensive diuresis, cangrelor infusion, heparin infusion, and nitroglycerin infusion with the latter for elevated blood pressures.  Result Date: 04/12/2023   Ramus lesion is 80% stenosed.   Ost Cx lesion is 65% stenosed.   1st Diag lesion is 80% stenosed.   Dist LM lesion is 90% stenosed.   Ost LAD lesion is 90% stenosed.   Dist LAD lesion is 30% stenosed. 1.  Ostial LAD occlusion treated with balloon angioplasty only.  This revealed high-grade distal left main, ostial LAD, ostial first diagonal, and ostial ramus disease.  There is moderate ostial left circumflex disease as well.  Given the totality of disease and patient's history of diabetes, surgical revascularization should be considered.  The patient was bolused with cangrelor and this infusion will be continued indefinitely as a bridge towards potential surgery.  Heparin infusion will be initiated once the TR band has been removed.  The patient did not receive any Plavix, Effient, or ticagrelor.  The images were reviewed with Dr. Cliffton Asters. 2.  Highly elevated LVEDP of 31 mmHg; the patient received 20 mg of Lasix IV x 1 due to being Lasix nave.  Further  diuresis will be pursued.  Recommendation: Evaluation for surgical revascularization.  Intensive diuresis, cangrelor infusion, heparin infusion, and nitroglycerin infusion with the latter for elevated blood pressures.    Cardiac Studies   Cath films personally reviewed  Patient Profile     67 y.o. male severe, multivessel CAD  Assessment & Plan    CAD/anterior MI: Status post LAD PTCA.  Severe, residual multivessel CAD.  Significant progression of disease since 2022.  Plan is for CABG later this week.  Continue IV cangrelor and IV heparin until surgery.  Hypertension:At home, was on carvedilol, lisinopril and amlodipine.  Add back medicines as blood pressure tolerates.  Hyperlipidemia: Continue high-dose atorvastatin.  Unclear whether he was actually taking the atorvastatin as his LDL was 176.  He is not very knowledgeable about the medicines he was taking at home.    DM: A1C 8.0     For questions or updates, please contact  [facility] Please consult [email or web address] for contact info under        Signed, Lance Muss, MD  04/12/2023, 8:37 AM

## 2023-04-12 NOTE — Progress Notes (Signed)
Will not ambulate due to severe LM disease, tachy, and hypoxia. Sitting on EOB 85 RA, pt declining O2 per RN. Practiced IS, 2000 ml. HR 110 ST. Denies CP or SOB, just c/o nausea.   Discussed IS, sternal precautions, mobility post op, and d/c planning. [place] sons who can be with him at d/c. He has been limited in ambulation lately due to knee pain and has previously received cortisone shots that have been helpful.  [id number] Ethelda Chick BS, ACSM-CEP 04/12/2023 3:29 PM

## 2023-04-13 ENCOUNTER — Inpatient Hospital Stay (HOSPITAL_COMMUNITY): Payer: Medicaid Other

## 2023-04-13 ENCOUNTER — Encounter (HOSPITAL_COMMUNITY): Payer: Self-pay | Admitting: Anesthesiology

## 2023-04-13 ENCOUNTER — Encounter (HOSPITAL_COMMUNITY)
Admission: EM | Disposition: A | Discharge status: Home / Self Care | Payer: Self-pay | Source: Home / Self Care | Attending: Internal Medicine

## 2023-04-13 ENCOUNTER — Other Ambulatory Visit: Payer: Self-pay

## 2023-04-13 DIAGNOSIS — I5021 Acute systolic (congestive) heart failure: Secondary | ICD-10-CM | POA: Diagnosis not present

## 2023-04-13 DIAGNOSIS — I3139 Other pericardial effusion (noninflammatory): Secondary | ICD-10-CM | POA: Diagnosis not present

## 2023-04-13 DIAGNOSIS — Z01810 Encounter for preprocedural cardiovascular examination: Secondary | ICD-10-CM | POA: Diagnosis not present

## 2023-04-13 HISTORY — PX: RIGHT HEART CATH: CATH118263

## 2023-04-13 LAB — POCT I-STAT EG7
Acid-Base Excess: 3 mmol/L — ABNORMAL HIGH (ref 0.0–2.0)
Acid-Base Excess: 3 mmol/L — ABNORMAL HIGH (ref 0.0–2.0)
Bicarbonate: 26.8 mmol/L (ref 20.0–28.0)
Bicarbonate: 27.5 mmol/L (ref 20.0–28.0)
Calcium, Ion: 1.13 mmol/L — ABNORMAL LOW (ref 1.15–1.40)
Calcium, Ion: 1.17 mmol/L (ref 1.15–1.40)
HCT: 40 % (ref 39.0–52.0)
HCT: 41 % (ref 39.0–52.0)
Hemoglobin: 13.6 g/dL (ref 13.0–17.0)
Hemoglobin: 13.9 g/dL (ref 13.0–17.0)
O2 Saturation: 67 %
O2 Saturation: 68 %
Potassium: 3.5 mmol/L (ref 3.5–5.1)
Potassium: 3.7 mmol/L (ref 3.5–5.1)
Sodium: 135 mmol/L (ref 135–145)
Sodium: 137 mmol/L (ref 135–145)
TCO2: 28 mmol/L (ref 22–32)
TCO2: 29 mmol/L (ref 22–32)
pCO2, Ven: 38.6 mmHg — ABNORMAL LOW (ref 44–60)
pCO2, Ven: 40.3 mmHg — ABNORMAL LOW (ref 44–60)
pH, Ven: 7.443 — ABNORMAL HIGH (ref 7.25–7.43)
pH, Ven: 7.45 — ABNORMAL HIGH (ref 7.25–7.43)
pO2, Ven: 33 mmHg (ref 32–45)
pO2, Ven: 34 mmHg (ref 32–45)

## 2023-04-13 LAB — GLUCOSE, CAPILLARY
Glucose-Capillary: 166 mg/dL — ABNORMAL HIGH (ref 70–99)
Glucose-Capillary: 179 mg/dL — ABNORMAL HIGH (ref 70–99)
Glucose-Capillary: 149 mg/dL — ABNORMAL HIGH (ref 70–99)
Glucose-Capillary: 138 mg/dL — ABNORMAL HIGH (ref 70–99)

## 2023-04-13 LAB — CBC
HCT: 41.8 % (ref 39.0–52.0)
Hemoglobin: 14.3 g/dL (ref 13.0–17.0)
MCH: 30.2 pg (ref 26.0–34.0)
MCHC: 34.2 g/dL (ref 30.0–36.0)
MCV: 88.2 fL (ref 80.0–100.0)
Platelets: 248 10*3/uL (ref 150–400)
RBC: 4.74 MIL/uL (ref 4.22–5.81)
RDW: 14.1 % (ref 11.5–15.5)
WBC: 14 10*3/uL — ABNORMAL HIGH (ref 4.0–10.5)
nRBC: 0 % (ref 0.0–0.2)

## 2023-04-13 LAB — BASIC METABOLIC PANEL
Anion gap: 11 (ref 5–15)
BUN: 12 mg/dL (ref 8–23)
CO2: 24 mmol/L (ref 22–32)
Calcium: 8.5 mg/dL — ABNORMAL LOW (ref 8.9–10.3)
Chloride: 99 mmol/L (ref 98–111)
Creatinine, Ser: 0.93 mg/dL (ref 0.61–1.24)
GFR, Estimated: >60 mL/min (ref >60)
Glucose, Bld: 194 mg/dL — ABNORMAL HIGH (ref 70–99)
Potassium: 3.6 mmol/L (ref 3.5–5.1)
Sodium: 134 mmol/L — ABNORMAL LOW (ref 135–145)

## 2023-04-13 LAB — SARS CORONAVIRUS 2 BY RT PCR: SARS Coronavirus 2 by RT PCR: NEGATIVE

## 2023-04-13 LAB — HEPARIN LEVEL (UNFRACTIONATED): Heparin Unfractionated: 0.39 IU/mL (ref 0.30–0.70)

## 2023-04-13 SURGERY — RIGHT HEART CATH
Anesthesia: LOCAL

## 2023-04-13 MED ORDER — TRANEXAMIC ACID (OHS) BOLUS VIA INFUSION
15.0000 mg/kg | INTRAVENOUS | Status: DC
  Filled 2023-04-13: qty 1872

## 2023-04-13 MED ORDER — HEPARIN (PORCINE) IN NACL 1000-0.9 UT/500ML-% IV SOLN
INTRAVENOUS | Status: DC | PRN
  Administered 2023-04-13: 500 mL

## 2023-04-13 MED ORDER — CEFAZOLIN SODIUM-DEXTROSE 2-4 GM/100ML-% IV SOLN
2.0000 g | INTRAVENOUS | Status: DC
  Filled 2023-04-13: qty 100

## 2023-04-13 MED ORDER — INSULIN REGULAR(HUMAN) IN NACL 100-0.9 UT/100ML-% IV SOLN
INTRAVENOUS | Status: DC
  Filled 2023-04-13: qty 100

## 2023-04-13 MED ORDER — LOSARTAN POTASSIUM 25 MG PO TABS: 12.5000 mg | ORAL_TABLET | Freq: Every day | ORAL | Status: DC

## 2023-04-13 MED ORDER — TRANEXAMIC ACID 1000 MG/10ML IV SOLN
1.5000 mg/kg/h | INTRAVENOUS | Status: DC
  Filled 2023-04-13: qty 25

## 2023-04-13 MED ORDER — EPINEPHRINE HCL 5 MG/250ML IV SOLN IN NS
0.0000 ug/min | INTRAVENOUS | Status: DC
  Filled 2023-04-13: qty 250

## 2023-04-13 MED ORDER — TRANEXAMIC ACID (OHS) PUMP PRIME SOLUTION
2.0000 mg/kg | INTRAVENOUS | Status: DC
  Filled 2023-04-13: qty 2.5

## 2023-04-13 MED ORDER — TICAGRELOR 90 MG PO TABS
180.0000 mg | ORAL_TABLET | Freq: Once | ORAL | Status: AC
  Administered 2023-04-13: 180 mg via ORAL
  Filled 2023-04-13: qty 2

## 2023-04-13 MED ORDER — SODIUM CHLORIDE 0.9% FLUSH: 3.0000 mL | Freq: Two times a day (BID) | INTRAVENOUS | Status: DC

## 2023-04-13 MED ORDER — MILRINONE LACTATE IN DEXTROSE 20-5 MG/100ML-% IV SOLN
0.3000 ug/kg/min | INTRAVENOUS | Status: DC
  Filled 2023-04-13: qty 100

## 2023-04-13 MED ORDER — LIDOCAINE HCL (PF) 1 % IJ SOLN
INTRAMUSCULAR | Status: AC
  Filled 2023-04-13: qty 30

## 2023-04-13 MED ORDER — SODIUM CHLORIDE 0.9% FLUSH: 3.0000 mL | INTRAVENOUS | Status: DC | PRN

## 2023-04-13 MED ORDER — DEXMEDETOMIDINE HCL IN NACL 400 MCG/100ML IV SOLN
0.1000 ug/kg/h | INTRAVENOUS | Status: DC
  Filled 2023-04-13: qty 100

## 2023-04-13 MED ORDER — ISOSORB DINITRATE-HYDRALAZINE 20-37.5 MG PO TABS
0.5000 | ORAL_TABLET | Freq: Three times a day (TID) | ORAL | Status: DC
  Administered 2023-04-13: 0.5 via ORAL
  Filled 2023-04-13 (×3): qty 0.5

## 2023-04-13 MED ORDER — LIDOCAINE HCL (PF) 1 % IJ SOLN
INTRAMUSCULAR | Status: DC | PRN
  Administered 2023-04-13: 2 mL

## 2023-04-13 MED ORDER — FUROSEMIDE 40 MG PO TABS
40.0000 mg | ORAL_TABLET | Freq: Every day | ORAL | Status: DC
  Administered 2023-04-14 – 2023-04-17 (×4): 40 mg via ORAL
  Filled 2023-04-13 (×5): qty 1

## 2023-04-13 MED ORDER — GADOBUTROL 1 MMOL/ML IV SOLN
10.0000 mL | Freq: Once | INTRAVENOUS | Status: AC | PRN
  Administered 2023-04-13: 10 mL via INTRAVENOUS

## 2023-04-13 MED ORDER — NOREPINEPHRINE 4 MG/250ML-% IV SOLN
0.0000 ug/min | INTRAVENOUS | Status: DC
  Filled 2023-04-13: qty 250

## 2023-04-13 MED ORDER — NITROGLYCERIN IN D5W 200-5 MCG/ML-% IV SOLN
2.0000 ug/min | INTRAVENOUS | Status: DC
  Filled 2023-04-13: qty 250

## 2023-04-13 MED ORDER — PLASMA-LYTE A IV SOLN
INTRAVENOUS | Status: DC
  Filled 2023-04-13: qty 2.5

## 2023-04-13 MED ORDER — MAGNESIUM SULFATE 50 % IJ SOLN
40.0000 meq | INTRAMUSCULAR | Status: DC
  Filled 2023-04-13: qty 9.85

## 2023-04-13 MED ORDER — HEPARIN 30,000 UNITS/1000 ML (OHS) CELLSAVER SOLUTION
Status: DC
  Filled 2023-04-13: qty 1000

## 2023-04-13 MED ORDER — POTASSIUM CHLORIDE 2 MEQ/ML IV SOLN
80.0000 meq | INTRAVENOUS | Status: DC
  Filled 2023-04-13: qty 40

## 2023-04-13 MED ORDER — ASPIRIN 81 MG PO CHEW: 81.0000 mg | CHEWABLE_TABLET | ORAL | Status: DC

## 2023-04-13 MED ORDER — SODIUM CHLORIDE 0.9 % IV SOLN: INTRAVENOUS | Status: DC

## 2023-04-13 MED ORDER — TICAGRELOR 90 MG PO TABS
90.0000 mg | ORAL_TABLET | Freq: Two times a day (BID) | ORAL | Status: DC
  Administered 2023-04-14 – 2023-04-17 (×7): 90 mg via ORAL
  Filled 2023-04-13 (×7): qty 1

## 2023-04-13 MED ORDER — SODIUM CHLORIDE 0.9 % IV SOLN: 250.0000 mL | INTRAVENOUS | Status: DC | PRN

## 2023-04-13 MED ORDER — POTASSIUM CHLORIDE 20 MEQ PO PACK
40.0000 meq | PACK | Freq: Two times a day (BID) | ORAL | Status: DC
  Administered 2023-04-13 – 2023-04-16 (×7): 40 meq via ORAL
  Filled 2023-04-13 (×8): qty 2

## 2023-04-13 MED ORDER — PHENYLEPHRINE HCL-NACL 20-0.9 MG/250ML-% IV SOLN
30.0000 ug/min | INTRAVENOUS | Status: DC
  Filled 2023-04-13: qty 250

## 2023-04-13 MED ORDER — VANCOMYCIN HCL 1500 MG/300ML IV SOLN
1500.0000 mg | INTRAVENOUS | Status: DC
  Filled 2023-04-13: qty 300

## 2023-04-13 SURGICAL SUPPLY — 5 items
CATH SWAN GANZ 7F STRAIGHT (CATHETERS) IMPLANT
GLIDESHEATH SLENDER 7FR .021G (SHEATH) IMPLANT
GUIDEWIRE .025 260CM (WIRE) IMPLANT
PACK CARDIAC CATHETERIZATION (CUSTOM PROCEDURE TRAY) ×1 IMPLANT
TRANSDUCER W/STOPCOCK (MISCELLANEOUS) ×1 IMPLANT

## 2023-04-13 NOTE — Interval H&P Note (Signed)
History and Physical Interval Note:  04/13/2023 4:42 PM  Jacob Gonzales  has presented today for surgery, with the diagnosis of acute systolic heart failure.  The various methods of treatment have been discussed with the patient and family. After consideration of risks, benefits and other options for treatment, the patient has consented to  Procedure(s): RIGHT HEART CATH (N/A) as a surgical intervention.  The patient's history has been reviewed, patient examined, no change in status, stable for surgery.  I have reviewed the patient's chart and labs.  Questions were answered to the patient's satisfaction.     Sacheen Arrasmith

## 2023-04-13 NOTE — Progress Notes (Signed)
Pre-CABG vascular studies completed.   Please see CV Procedures for preliminary results.  Kirstie Larsen, RVT  3:31 PM 04/13/23

## 2023-04-13 NOTE — Progress Notes (Addendum)
Advanced Heart Failure Rounding Note  PCP-Cardiologist: Lance Muss, MD   Subjective:    No CP or dyspnea. Anxious about next steps.  Awaiting cMRI.   Objective:   Weight Range: 124.8 kg Body mass index is 39.48 kg/m.   Vital Signs:   Temp:  [98.1 F (36.7 C)-99.2 F (37.3 C)] 98.1 F (36.7 C) (05/09 0327) Pulse Rate:  [88-118] 98 (05/09 0900) Resp:  [12-30] 24 (05/09 0900) BP: (102-174)/(56-104) 139/83 (05/09 0900) SpO2:  [84 %-98 %] 91 % (05/09 0900) Weight:  [124.8 kg] 124.8 kg (05/09 0500) Last BM Date :  (PTA)  Weight change: Filed Weights   04/11/23 1629 04/12/23 0500 04/13/23 0500  Weight: 118 kg 125.6 kg 124.8 kg    Intake/Output:   Intake/Output Summary (Last 24 hours) at 04/13/2023 1001 Last data filed at 04/13/2023 0900 Gross per 24 hour  Intake 2259.73 ml  Output 4000 ml  Net -1740.27 ml      Physical Exam    General:  Well appearing. Lying comfortably in bed HEENT: Normal Neck: Supple. JVP difficult d/t thick neck. Carotids 2+ bilat; no bruits.  Cor: PMI nondisplaced. Regular rate & rhythm. No rubs, gallops or murmurs. Lungs: Clear Abdomen: obese, nontender, nondistended.  Extremities: No cyanosis, clubbing, rash, edema Neuro: Alert & orientedx3, cranial nerves grossly intact. moves all 4 extremities w/o difficulty. Affect pleasant   Telemetry   Sr/ST 90s-100s  Labs    CBC Recent Labs    04/11/23 1650 04/11/23 1653 04/12/23 0549 04/13/23 0156  WBC 17.0*  --  17.8* 14.0*  NEUTROABS 11.1*  --   --   --   HGB 16.2   < > 14.7 14.3  HCT 46.6   < > 40.0 41.8  MCV 87.3  --  84.7 88.2  PLT 311  --  279 248   < > = values in this interval not displayed.   Basic Metabolic Panel Recent Labs    98/11/91 0549 04/13/23 0156  NA 131* 134*  K 3.3* 3.6  CL 97* 99  CO2 22 24  GLUCOSE 179* 194*  BUN 17 12  CREATININE 1.06 0.93  CALCIUM 8.3* 8.5*   Liver Function Tests Recent Labs    04/11/23 1650  AST 24  ALT 19   ALKPHOS 77  BILITOT 1.0  PROT 7.4  ALBUMIN 3.6   No results for input(s): "LIPASE", "AMYLASE" in the last 72 hours. Cardiac Enzymes No results for input(s): "CKTOTAL", "CKMB", "CKMBINDEX", "TROPONINI" in the last 72 hours.  BNP: BNP (last 3 results) No results for input(s): "BNP" in the last 8760 hours.  ProBNP (last 3 results) No results for input(s): "PROBNP" in the last 8760 hours.   D-Dimer No results for input(s): "DDIMER" in the last 72 hours. Hemoglobin A1C Recent Labs    04/11/23 1650  HGBA1C 8.0*   Fasting Lipid Panel Recent Labs    04/11/23 1650  CHOL 227*  HDL 29*  LDLCALC 176*  TRIG 111  CHOLHDL 7.8   Thyroid Function Tests No results for input(s): "TSH", "T4TOTAL", "T3FREE", "THYROIDAB" in the last 72 hours.  Invalid input(s): "FREET3"  Other results:   Imaging    No results found.   Medications:     Scheduled Medications:  aspirin  81 mg Oral Daily   Or   aspirin  300 mg Rectal Daily   atorvastatin  80 mg Oral Daily   Chlorhexidine Gluconate Cloth  6 each Topical Daily   folic acid  1 mg Oral Daily   furosemide  40 mg Intravenous BID   insulin aspart  0-15 Units Subcutaneous TID WC   insulin glargine-yfgn  10 Units Subcutaneous Daily   [START ON 04/14/2023] losartan  12.5 mg Oral Daily   multivitamin with minerals  1 tablet Oral Daily   potassium chloride  40 mEq Oral BID   sodium chloride flush  3 mL Intravenous Q12H   spironolactone  12.5 mg Oral Daily   thiamine  100 mg Oral Daily    Infusions:  sodium chloride     cangrelor (KENGREAL) 50 mg in sodium chloride 0.9 % 250 mL (0.2 mg/mL) infusion 0.75 mcg/kg/min (04/13/23 0900)   heparin 1,800 Units/hr (04/13/23 0900)   nitroGLYCERIN 70 mcg/min (04/13/23 0900)    PRN Medications: sodium chloride, acetaminophen, LORazepam, ondansetron (ZOFRAN) IV, sodium chloride flush    Patient Profile   67 y.o. male with history of CAD and prior MI in 2022, HFmrEF, DM II, HTN, hx  CVA. Admitted with anterior STEMI and acute on chronic systolic CHF.   Assessment/Plan   Acute on chronic systolic CHF/ICM: -EF 40-45% following MI in 2022 -Echo this admit EF 25-30%. In setting of anterior STEMI. Severe LM, LAD (treated with PTCA) and ramus disease (small), moderate LCx.  -Volume overloaded with LVEDP 31 at time of cath. No RHC performed. Diuresing with IV lasix 40 BID.  -Hold spiro and losartan for now. -Add bidil 1/2 tab TID for afterload reduction -Plan RHC today. See discussion below.   2. Anterior STEMI/CAD -Hx NSTEMI in 2022 s/p PCI/DES to OM2 -Now presenting with anterior STEMI. High grade d LM, ostial LAD (treated with PTCA), ostial D1 and ostial ramus disease. Moderate LCx. -Seen by TCTS. Awaiting cMRI to assess viability and RHC to definitively evaluate hemodynamics and volume status before moving forward with CABG. His surgical risk is increased in view of reduced EF, recent large infarct, and uncontrolled DM. If CO significantly reduced may need to consider advanced therapies. -Continue aspirin, heparin, cangrelor and atorvastatin -No recurrent angina   3. Hypertension -Uncontrolled on presentation. Significantly improved   4. DM II -A1c 8.0% -SSI + semglee   5. Hyperlipidemia -LDL 176, Lipoprotein a pending -Started Atorvastatin 80 -May benefit from PCSK-9 inhibitor as outpatient   Length of Stay: 2  Mayson Sterbenz, Dalbert Garnet, PA-C  04/13/2023, 10:01 AM  Advanced Heart Failure Team Pager [phone] (M-F; 7a - 5p)  Please contact [facility] for night-coverage after hours (5p -7a ) and weekends on [email or web address]

## 2023-04-13 NOTE — Progress Notes (Signed)
Interventional Cardiology Note:  Reviewed CMR findings and RHC data with Dr. Gasper Lloyd.  Given CMR findings suggesting nonviable anterior wall, will proceed with PCI of left main into Lcx tomorrow.  Cangrelor will be stopped, the patient will be loaded with Ticagrelor 180mg  and be made NPO at [place].  Jacob Gonzales

## 2023-04-13 NOTE — Progress Notes (Signed)
ANTICOAGULATION CONSULT NOTE  Pharmacy Consult for IV heparin Indication: CAD awaiting CABG  No Known Allergies  Patient Measurements: Weight: 124.8 kg (275 lb 2.2 oz) Heparin Dosing Weight: ~ 95 kg  Vital Signs: Temp: 98.1 F (36.7 C) (05/09 0327) Temp Source: Axillary (05/09 0327) BP: 146/92 (05/09 0700) Pulse Rate: 101 (05/09 0700)  Labs: Recent Labs    04/11/23 1650 04/11/23 1653 04/11/23 1737 04/11/23 2012 04/12/23 0549 04/12/23 1314 04/13/23 0156  HGB 16.2 16.3 15.0  --  14.7  --  14.3  HCT 46.6 48.0 44.0  --  40.0  --  41.8  PLT 311  --   --   --  279  --  248  APTT >200*  --   --   --   --   --   --   LABPROT 15.8*  --   --   --   --   --   --   INR 1.2  --   --   --   --   --   --   HEPARINUNFRC  --   --   --   --  0.17* 0.24* 0.39  CREATININE 1.01 1.00  --   --  1.06  --  0.93  TROPONINIHS 813*  --   --  >24,000*  --   --   --      Estimated Creatinine Clearance: 106.4 mL/min (by C-G formula based on SCr of 0.93 mg/dL).   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   Arthritis    Bilateral shoulders, Hands, Knees   Bone cancer (HCC) 2016   Brain cancer (HCC) 2016   Diabetes (HCC) 11/17/2015   HTN (hypertension) 11/17/2015   NSTEMI (non-ST elevated myocardial infarction) (HCC)     Medications:  Infusions:   sodium chloride     cangrelor (KENGREAL) 50 mg in sodium chloride 0.9 % 250 mL (0.2 mg/mL) infusion 0.75 mcg/kg/min (04/13/23 0500)   heparin 1,800 Units/hr (04/13/23 0500)   nitroGLYCERIN 70 mcg/min (04/13/23 0500)    Assessment: 67 yo male admitted with STEMI, found with multivessel dx, pharmacy asked to begin IV Heparin gtt after TR band off.  Awaiting CABG Friday.  Heparin level therapeutic at 0.39. CBC stable.  No issues with infusion or bleeding per RN.  Goal of Therapy:  Heparin level 0.3-0.5 units/ml Monitor platelets by anticoagulation  protocol: Yes   Plan:  Continue IV heparin 1800 units/hr. Daily heparin level and CBC. Monitor for s/sx of bleeding   Andreas Ohm, PharmD Pharmacy Resident  04/13/2023 7:09 AM

## 2023-04-14 ENCOUNTER — Encounter (HOSPITAL_COMMUNITY): Payer: Self-pay | Admitting: Internal Medicine

## 2023-04-14 ENCOUNTER — Other Ambulatory Visit (HOSPITAL_COMMUNITY): Payer: Self-pay

## 2023-04-14 ENCOUNTER — Telehealth (HOSPITAL_COMMUNITY): Payer: Self-pay | Admitting: Pharmacy Technician

## 2023-04-14 ENCOUNTER — Inpatient Hospital Stay (HOSPITAL_COMMUNITY)
Admission: EM | Disposition: A | Discharge status: Home / Self Care | Payer: Self-pay | Source: Home / Self Care | Attending: Internal Medicine

## 2023-04-14 DIAGNOSIS — I2510 Atherosclerotic heart disease of native coronary artery without angina pectoris: Secondary | ICD-10-CM | POA: Diagnosis not present

## 2023-04-14 DIAGNOSIS — I5021 Acute systolic (congestive) heart failure: Secondary | ICD-10-CM | POA: Diagnosis not present

## 2023-04-14 HISTORY — PX: CORONARY STENT INTERVENTION: CATH118234

## 2023-04-14 HISTORY — PX: CORONARY LITHOTRIPSY: CATH118330

## 2023-04-14 HISTORY — PX: CORONARY ULTRASOUND/IVUS: CATH118244

## 2023-04-14 LAB — CBC
HCT: 41 % (ref 39.0–52.0)
Hemoglobin: 14 g/dL (ref 13.0–17.0)
MCH: 30.5 pg (ref 26.0–34.0)
MCHC: 34.1 g/dL (ref 30.0–36.0)
MCV: 89.3 fL (ref 80.0–100.0)
Platelets: 253 10*3/uL (ref 150–400)
RBC: 4.59 MIL/uL (ref 4.22–5.81)
RDW: 14.2 % (ref 11.5–15.5)
WBC: 13.6 10*3/uL — ABNORMAL HIGH (ref 4.0–10.5)
nRBC: 0 % (ref 0.0–0.2)

## 2023-04-14 LAB — MAGNESIUM: Magnesium: 2.1 mg/dL (ref 1.7–2.4)

## 2023-04-14 LAB — BASIC METABOLIC PANEL
Anion gap: 6 (ref 5–15)
BUN: 14 mg/dL (ref 8–23)
CO2: 24 mmol/L (ref 22–32)
Calcium: 8.1 mg/dL — ABNORMAL LOW (ref 8.9–10.3)
Chloride: 101 mmol/L (ref 98–111)
Creatinine, Ser: 0.93 mg/dL (ref 0.61–1.24)
GFR, Estimated: >60 mL/min (ref >60)
Glucose, Bld: 146 mg/dL — ABNORMAL HIGH (ref 70–99)
Potassium: 3.6 mmol/L (ref 3.5–5.1)
Sodium: 131 mmol/L — ABNORMAL LOW (ref 135–145)

## 2023-04-14 LAB — POCT ACTIVATED CLOTTING TIME
Activated Clotting Time: 0 seconds
Activated Clotting Time: 385 seconds
Activated Clotting Time: >1000 seconds

## 2023-04-14 LAB — GLUCOSE, CAPILLARY
Glucose-Capillary: 130 mg/dL — ABNORMAL HIGH (ref 70–99)
Glucose-Capillary: 142 mg/dL — ABNORMAL HIGH (ref 70–99)
Glucose-Capillary: 140 mg/dL — ABNORMAL HIGH (ref 70–99)
Glucose-Capillary: 195 mg/dL — ABNORMAL HIGH (ref 70–99)

## 2023-04-14 LAB — LIPOPROTEIN A (LPA): Lipoprotein (a): 18.4 nmol/L (ref <75.0)

## 2023-04-14 LAB — VAS US DOPPLER PRE CABG

## 2023-04-14 LAB — HEPARIN LEVEL (UNFRACTIONATED): Heparin Unfractionated: 0.13 IU/mL — ABNORMAL LOW (ref 0.30–0.70)

## 2023-04-14 SURGERY — CORONARY STENT INTERVENTION
Anesthesia: LOCAL

## 2023-04-14 MED ORDER — ACETAMINOPHEN 325 MG PO TABS
650.0000 mg | ORAL_TABLET | ORAL | Status: DC | PRN
  Administered 2023-04-14: 650 mg via ORAL
  Filled 2023-04-14: qty 2

## 2023-04-14 MED ORDER — ASPIRIN 81 MG PO CHEW: 81.0000 mg | CHEWABLE_TABLET | ORAL | Status: DC

## 2023-04-14 MED ORDER — LIDOCAINE HCL (PF) 1 % IJ SOLN
INTRAMUSCULAR | Status: AC
  Filled 2023-04-14: qty 30

## 2023-04-14 MED ORDER — TICAGRELOR 90 MG PO TABS: 180.0000 mg | ORAL_TABLET | Freq: Once | ORAL | Status: DC

## 2023-04-14 MED ORDER — TICAGRELOR 90 MG PO TABS
90.0000 mg | ORAL_TABLET | Freq: Two times a day (BID) | ORAL | 11 refills | Status: DC
  Filled 2023-04-14: qty 60, 30d supply, fill #0

## 2023-04-14 MED ORDER — MIDAZOLAM HCL 2 MG/2ML IJ SOLN
INTRAMUSCULAR | Status: DC | PRN
  Administered 2023-04-14 (×2): 1 mg via INTRAVENOUS

## 2023-04-14 MED ORDER — ONDANSETRON HCL 4 MG/2ML IJ SOLN: 4.0000 mg | Freq: Four times a day (QID) | INTRAMUSCULAR | Status: DC | PRN

## 2023-04-14 MED ORDER — TICAGRELOR 90 MG PO TABS: 90.0000 mg | ORAL_TABLET | Freq: Two times a day (BID) | ORAL | Status: DC

## 2023-04-14 MED ORDER — SODIUM CHLORIDE 0.9% FLUSH: 3.0000 mL | INTRAVENOUS | Status: DC | PRN

## 2023-04-14 MED ORDER — VERAPAMIL HCL 2.5 MG/ML IV SOLN
INTRAVENOUS | Status: DC | PRN
  Administered 2023-04-14: 10 mL via INTRA_ARTERIAL

## 2023-04-14 MED ORDER — MIDAZOLAM HCL 2 MG/2ML IJ SOLN
INTRAMUSCULAR | Status: AC
  Filled 2023-04-14: qty 2

## 2023-04-14 MED ORDER — FENTANYL CITRATE (PF) 100 MCG/2ML IJ SOLN
INTRAMUSCULAR | Status: AC
  Filled 2023-04-14: qty 2

## 2023-04-14 MED ORDER — ASPIRIN 81 MG PO CHEW
81.0000 mg | CHEWABLE_TABLET | Freq: Every day | ORAL | Status: DC
  Administered 2023-04-15 – 2023-04-17 (×3): 81 mg via ORAL
  Filled 2023-04-14 (×3): qty 1

## 2023-04-14 MED ORDER — HEPARIN (PORCINE) IN NACL 1000-0.9 UT/500ML-% IV SOLN
INTRAVENOUS | Status: DC | PRN
  Administered 2023-04-14 (×2): 500 mL via INTRA_ARTERIAL

## 2023-04-14 MED ORDER — LABETALOL HCL 5 MG/ML IV SOLN: 10.0000 mg | INTRAVENOUS | Status: AC | PRN

## 2023-04-14 MED ORDER — HEPARIN SODIUM (PORCINE) 1000 UNIT/ML IJ SOLN
INTRAMUSCULAR | Status: AC
  Filled 2023-04-14: qty 10

## 2023-04-14 MED ORDER — NITROGLYCERIN 1 MG/10 ML FOR IR/CATH LAB
INTRA_ARTERIAL | Status: AC
  Filled 2023-04-14: qty 10

## 2023-04-14 MED ORDER — SODIUM CHLORIDE 0.9 % IV SOLN: 250.0000 mL | INTRAVENOUS | Status: DC | PRN

## 2023-04-14 MED ORDER — INSULIN GLARGINE-YFGN 100 UNIT/ML ~~LOC~~ SOLN
10.0000 [IU] | Freq: Every day | SUBCUTANEOUS | Status: DC
  Administered 2023-04-14 – 2023-04-16 (×3): 10 [IU] via SUBCUTANEOUS
  Filled 2023-04-14 (×6): qty 0.1

## 2023-04-14 MED ORDER — SODIUM CHLORIDE 0.9% FLUSH
3.0000 mL | Freq: Two times a day (BID) | INTRAVENOUS | Status: DC
  Administered 2023-04-14 – 2023-04-17 (×6): 3 mL via INTRAVENOUS

## 2023-04-14 MED ORDER — SODIUM CHLORIDE 0.9 % IV SOLN: INTRAVENOUS | Status: DC

## 2023-04-14 MED ORDER — HEPARIN SODIUM (PORCINE) 1000 UNIT/ML IJ SOLN
INTRAMUSCULAR | Status: DC | PRN
  Administered 2023-04-14: 5000 [IU] via INTRA_ARTERIAL
  Administered 2023-04-14: 8000 [IU] via INTRAVENOUS

## 2023-04-14 MED ORDER — LIDOCAINE HCL (PF) 1 % IJ SOLN
INTRAMUSCULAR | Status: DC | PRN
  Administered 2023-04-14: 5 mL

## 2023-04-14 MED ORDER — HYDRALAZINE HCL 20 MG/ML IJ SOLN: 10.0000 mg | INTRAMUSCULAR | Status: AC | PRN

## 2023-04-14 MED ORDER — LACTATED RINGERS IV BOLUS
250.0000 mL | Freq: Once | INTRAVENOUS | Status: AC
  Administered 2023-04-14: 250 mL via INTRAVENOUS

## 2023-04-14 MED ORDER — SODIUM CHLORIDE 0.9% FLUSH
3.0000 mL | Freq: Two times a day (BID) | INTRAVENOUS | Status: DC
  Administered 2023-04-14 (×2): 3 mL via INTRAVENOUS

## 2023-04-14 MED ORDER — FENTANYL CITRATE (PF) 100 MCG/2ML IJ SOLN
INTRAMUSCULAR | Status: DC | PRN
  Administered 2023-04-14 (×2): 25 ug via INTRAVENOUS

## 2023-04-14 MED ORDER — VERAPAMIL HCL 2.5 MG/ML IV SOLN
INTRAVENOUS | Status: AC
  Filled 2023-04-14: qty 2

## 2023-04-14 MED ORDER — IOHEXOL 350 MG/ML SOLN
INTRAVENOUS | Status: DC | PRN
  Administered 2023-04-14: 90 mL via INTRA_ARTERIAL

## 2023-04-14 SURGICAL SUPPLY — 28 items
BALLN EMERGE MR 4.0X12 (BALLOONS) ×1
BALLN EUPHORA RX 4.0X25 (BALLOONS) ×1
BALLN SCOREFLEX 4.0X15 (BALLOONS) ×1
BALLN ~~LOC~~ EMERGE MR 4.0X15 (BALLOONS) ×1
BALLN ~~LOC~~ EMERGE MR 4.5X12 (BALLOONS) ×1
BALLOON EMERGE MR 4.0X12 (BALLOONS) IMPLANT
BALLOON EUPHORA RX 4.0X25 (BALLOONS) IMPLANT
BALLOON SCOREFLEX 4.0X15 (BALLOONS) IMPLANT
BALLOON ~~LOC~~ EMERGE MR 4.0X15 (BALLOONS) IMPLANT
BALLOON ~~LOC~~ EMERGE MR 4.5X12 (BALLOONS) IMPLANT
CATH GUIDELINER COAST (CATHETERS) IMPLANT
CATH INFINITI 5FR ANG PIGTAIL (CATHETERS) IMPLANT
CATH LAUNCHER 6FR EBU3.5 (CATHETERS) IMPLANT
CATH OPTICROSS HD (CATHETERS) IMPLANT
CATH SHOCKWAVE C2 4.0X12 (CATHETERS) IMPLANT
DEVICE RAD COMP TR BAND LRG (VASCULAR PRODUCTS) IMPLANT
GLIDESHEATH SLEND SS 6F .021 (SHEATH) IMPLANT
GUIDEWIRE VAS SION BLUE 190 (WIRE) IMPLANT
KIT ENCORE 26 ADVANTAGE (KITS) IMPLANT
KIT HEART LEFT (KITS) ×1 IMPLANT
KIT HEMO VALVE WATCHDOG (MISCELLANEOUS) IMPLANT
PACK CARDIAC CATHETERIZATION (CUSTOM PROCEDURE TRAY) ×1 IMPLANT
SLED PULL BACK IVUS (MISCELLANEOUS) IMPLANT
STENT MEGATRON 4.5X20 (Permanent Stent) IMPLANT
TRANSDUCER W/STOPCOCK (MISCELLANEOUS) ×1 IMPLANT
TUBING CIL FLEX 10 FLL-RA (TUBING) ×1 IMPLANT
WIRE ASAHI PROWATER 180CM (WIRE) IMPLANT
WIRE EMERALD 3MM-J .035X260CM (WIRE) IMPLANT

## 2023-04-14 NOTE — Telephone Encounter (Signed)
Pharmacy Patient Advocate Encounter  Insurance verification completed.    The patient is insured through [employer] [place] [employer]   The patient is currently admitted and ran test claims for the following: Brilinta .  Copays and coinsurance results were relayed to Inpatient clinical team.

## 2023-04-14 NOTE — TOC Progression Note (Signed)
Discharge medication (1) are being stored in the main pharmacy on the ground floor until patient is ready for discharge.   

## 2023-04-14 NOTE — TOC Benefit Eligibility Note (Addendum)
Patient Product/process development scientist completed.    The patient is currently admitted and upon discharge could be taking Brilinta 90 mg.  The current 30 day co-pay is $4.00.   The patient is currently admitted and upon discharge could be taking Farxiga 10 mg.  The current 30 day co-pay is $4.00.   The patient is currently admitted and upon discharge could be taking Entresto 24-26 mg.  The current 30 day co-pay is $4.00.   The patient is currently admitted and upon discharge could be taking Jardiance 10 mg.  Requires Prior Authorization  The patient is insured through [employer] [place] [employer]   This test claim was processed through [facility]- copay amounts may vary at other pharmacies due to pharmacy/plan contracts, or as the patient moves through the different stages of their insurance plan.  Roland Earl, CPHT Pharmacy Patient Advocate Specialist [facility] Patient Advocate Team Direct Number: [phone]  Fax: [phone]

## 2023-04-14 NOTE — Interval H&P Note (Signed)
History and Physical Interval Note:  04/14/2023 7:00 AM  Jacob Gonzales  has presented today for surgery, with the diagnosis of CAD.  The various methods of treatment have been discussed with the patient and family. After consideration of risks, benefits and other options for treatment, the patient has consented to  Procedure(s): CORONARY STENT INTERVENTION (N/A) as a surgical intervention.  The patient's history has been reviewed, patient examined, no change in status, stable for surgery.  I have reviewed the patient's chart and labs.  Questions were answered to the patient's satisfaction.     Orbie Pyo

## 2023-04-14 NOTE — Progress Notes (Signed)
ANTICOAGULATION CONSULT NOTE  Pharmacy Consult for IV heparin Indication: CAD awaiting CABG  No Known Allergies  Patient Measurements: Weight: 122.6 kg (270 lb 4.5 oz) Heparin Dosing Weight: ~ 95 kg  Vital Signs: Temp: 98.4 F (36.9 C) (05/10 0315) Temp Source: Axillary (05/10 0315) BP: 98/62 (05/10 0700) Pulse Rate: 76 (05/10 0700)  Labs: Recent Labs    04/11/23 1650 04/11/23 1650 04/11/23 1653 04/11/23 2012 04/12/23 0549 04/12/23 1314 04/13/23 0156 04/13/23 1637 04/14/23 0203  HGB 16.2  --    < >  --  14.7  --  14.3 13.6  13.9 14.0  HCT 46.6  --    < >  --  40.0  --  41.8 40.0  41.0 41.0  PLT 311  --   --   --  279  --  248  --  253  APTT >200*  --   --   --   --   --   --   --   --   LABPROT 15.8*  --   --   --   --   --   --   --   --   INR 1.2  --   --   --   --   --   --   --   --   HEPARINUNFRC  --    < >  --   --  0.17* 0.24* 0.39  --  0.13*  CREATININE 1.01  --    < >  --  1.06  --  0.93  --  0.93  TROPONINIHS 813*  --   --  >24,000*  --   --   --   --   --    < > = values in this interval not displayed.     Estimated Creatinine Clearance: 105.3 mL/min (by C-G formula based on SCr of 0.93 mg/dL).   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   Arthritis    Bilateral shoulders, Hands, Knees   Bone cancer (HCC) 2016   Brain cancer (HCC) 2016   Diabetes (HCC) 11/17/2015   HTN (hypertension) 11/17/2015   NSTEMI (non-ST elevated myocardial infarction) (HCC)     Medications:  Infusions:   sodium chloride     sodium chloride     [START ON 04/15/2023] sodium chloride     heparin 1,800 Units/hr (04/14/23 0500)    Assessment: 67 yo male admitted with STEMI, found with multivessel dx, pharmacy asked to begin IV Heparin gtt after TR band off.  Given CMR findings suggesting nonviable anterior wall, will proceed with PCI of left main into Lcx and forego  CABG.  Heparin level below goal 0.13. CBC stable.  No issues with infusion or bleeding from IV site per RN. Plan for stent placement today.   Goal of Therapy:  Heparin level 0.3-0.5 units/ml Monitor platelets by anticoagulation protocol: Yes   Plan:  Increase IV heparin to 2100 units/hr. F/u post procedure  Heparin level daily with CBC. Monitor for s/sx of bleeding   Andreas Ohm, PharmD Pharmacy Resident  04/14/2023 7:13 AM

## 2023-04-14 NOTE — TOC Initial Note (Signed)
Transition of Care [facility]) - Initial/Assessment Note    Patient Details  Name: Jacob Gonzales MRN: [id number] Date of Birth: 04-19-1956  Transition of Care [facility]) CM/SW Contact:    Elliot Cousin, RN Phone Number: [phone] 04/14/2023, 4:40 PM   Clinical Narrative:     CM spoke to pt bedside. States his son live in the home. States he was independent PTA. Educated pt on importance of daily weights.               Expected Discharge Plan: Home/Self Care Barriers to Discharge: No Barriers Identified   Patient Goals and CMS Choice Patient states their goals for this hospitalization and ongoing recovery are:: wants to remain independent     Expected Discharge Plan and Services   Discharge Planning Services: CM Consult   Living arrangements for the past 2 months: Single Family Home                    Prior Living Arrangements/Services Living arrangements for the past 2 months: Single Family Home Lives with:: Adult Children Patient language and need for interpreter reviewed:: Yes Do you feel safe going back to the place where you live?: Yes      Need for Family Participation in Patient Care: No (Comment) Care giver support system in place?: Yes (comment)   Criminal Activity/Legal Involvement Pertinent to Current Situation/Hospitalization: No - Comment as needed  Activities of Daily Living Home Assistive Devices/Equipment: None ADL Screening (condition at time of admission) Patient's cognitive ability adequate to safely complete daily activities?: Yes Is the patient deaf or have difficulty hearing?: No Does the patient have difficulty seeing, even when wearing glasses/contacts?: No Does the patient have difficulty concentrating, remembering, or making decisions?: No Patient able to express need for assistance with ADLs?: No Does the patient have difficulty dressing or bathing?: No Independently performs ADLs?: Yes (appropriate for developmental age) Does the patient have  difficulty walking or climbing stairs?: No Weakness of Legs: None Weakness of Arms/Hands: None  Permission Sought/Granted Permission sought to share information with : Case Manager, Family Supports Permission granted to share information with : Yes, Verbal Permission Granted  Share Information with NAME: Marcos Edwardson     Permission granted to share info w Relationship: son  Permission granted to share info w Contact Information: [phone]  Emotional Assessment Appearance:: Appears stated age Attitude/Demeanor/Rapport: Engaged Affect (typically observed): Accepting Orientation: : Oriented to Self, Oriented to Place, Oriented to  Time, Oriented to Situation   Psych Involvement: No (comment)  Admission diagnosis:  STEMI (ST elevation myocardial infarction) [facility]) [I21.3] Patient Active Problem List   Diagnosis Date Noted   Acute systolic heart failure (HCC) 04/13/2023   STEMI (ST elevation myocardial infarction) (HCC) 04/11/2023   Chest pain 01/15/2021   Pulmonary nodules 01/15/2021   NSTEMI (non-ST elevated myocardial infarction) [facility])    Elevated troponin    Lymphadenopathy    Other chest pain    Syncope 03/03/2019   Acute arterial ischemic stroke, vertebrobasilar, 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 [place] [address] Phone: [phone] Fax: [phone]  [id number] [place],  - [address]  [place] [address]  Phone: [phone] Fax: [phone]  [facility] - [place], [place] - [address] [place] [address] Phone: [phone] Fax: [phone]  [facility] [address] [place] [address] Phone: [phone] Fax: [phone]     Social Determinants of Health (SDOH) Social History: SDOH Screenings   Food Insecurity: No Food Insecurity (04/13/2023)  Transportation Needs: No Transportation Needs (04/13/2023)  Utilities: Not At Risk (04/14/2023)  Tobacco Use: Low Risk  (04/14/2023)   SDOH Interventions: Utilities Interventions: Intervention Not Indicated   Readmission Risk Interventions     No data to display

## 2023-04-14 NOTE — Progress Notes (Signed)
ANTICOAGULATION CONSULT NOTE  Pharmacy Consult for IV heparin Indication: CAD awaiting CABG  No Known Allergies  Patient Measurements: Weight: 124.8 kg (275 lb 2.2 oz) Heparin Dosing Weight: ~ 95 kg  Vital Signs: Temp: 98.4 F (36.9 C) (05/10 0315) Temp Source: Axillary (05/10 0315) BP: 108/67 (05/10 0130) Pulse Rate: 89 (05/10 0130)  Labs: Recent Labs    04/11/23 1650 04/11/23 1650 04/11/23 1653 04/11/23 2012 04/12/23 0549 04/12/23 1314 04/13/23 0156 04/13/23 1637 04/14/23 0203  HGB 16.2  --    < >  --  14.7  --  14.3 13.6  13.9 14.0  HCT 46.6  --    < >  --  40.0  --  41.8 40.0  41.0 41.0  PLT 311  --   --   --  279  --  248  --  253  APTT >200*  --   --   --   --   --   --   --   --   LABPROT 15.8*  --   --   --   --   --   --   --   --   INR 1.2  --   --   --   --   --   --   --   --   HEPARINUNFRC  --    < >  --   --  0.17* 0.24* 0.39  --  0.13*  CREATININE 1.01  --    < >  --  1.06  --  0.93  --  0.93  TROPONINIHS 813*  --   --  >24,000*  --   --   --   --   --    < > = values in this interval not displayed.     Estimated Creatinine Clearance: 106.4 mL/min (by C-G formula based on SCr of 0.93 mg/dL).   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  Given CMR findings suggesting nonviable anterior wall, will proceed with PCI of left main into Lcx and forego  CABG.  Heparin level below goal 0.13, but heparin was held for cardiac imaging earlier this evening and not enough time to reach steady state. CBC stable.  No issues with infusion or minor bleeding from IV site per RN. Given previous therapeutic level on this rate will continue and recheck level later this morning.   Goal of Therapy:  Heparin level 0.3-0.5 units/ml Monitor platelets by anticoagulation protocol: Yes   Plan:  Continue IV heparin 1800 units/hr. Heparin level in 8 hours then daily with CBC. Monitor for s/sx of bleeding   Ruben Im, PharmD Clinical Pharmacist 04/14/2023 4:01 AM Please check AMION for all [facility] Pharmacy numbers

## 2023-04-14 NOTE — Progress Notes (Signed)
Advanced Heart Failure Rounding Note  PCP-Cardiologist: Lance Muss, MD   Subjective:    - No complaints this AM - No CP, SOB or events overnight - RHC yesterday with low filling pressures and a preserved cardiac index   Objective:   Weight Range: 122.6 kg Body mass index is 38.78 kg/m.   Vital Signs:   Temp:  [97.8 F (36.6 C)-99.1 F (37.3 C)] 98.4 F (36.9 C) (05/10 0315) Pulse Rate:  [74-105] 76 (05/10 0700) Resp:  [15-97] 18 (05/10 0700) BP: (71-139)/(33-83) 98/62 (05/10 0700) SpO2:  [89 %-95 %] 90 % (05/10 0700) Weight:  [122.6 kg] 122.6 kg (05/10 0013) Last BM Date :  (PTA)  Weight change: Filed Weights   04/12/23 0500 04/13/23 0500 04/14/23 0013  Weight: 125.6 kg 124.8 kg 122.6 kg    Intake/Output:   Intake/Output Summary (Last 24 hours) at 04/14/2023 0841 Last data filed at 04/14/2023 0500 Gross per 24 hour  Intake 535.08 ml  Output 1250 ml  Net -714.92 ml       Physical Exam    General:  Well appearing. Lying comfortably in bed HEENT: Normal Neck: Supple. JVP difficult d/t thick neck. Carotids 2+ bilat; no bruits.  Cor: PMI nondisplaced. Regular rate & rhythm. No rubs, gallops or murmurs. Lungs: Clear Abdomen: obese, nontender, nondistended.  Extremities: No edema, warm to touch. Neuro: Alert & orientedx3, cranial nerves grossly intact. moves all 4 extremities w/o difficulty. Affect pleasant   Telemetry   Sr/ST 90s-100s  Labs    CBC Recent Labs    04/11/23 1650 04/11/23 1653 04/13/23 0156 04/13/23 1637 04/14/23 0203  WBC 17.0*   < > 14.0*  --  13.6*  NEUTROABS 11.1*  --   --   --   --   HGB 16.2   < > 14.3 13.6  13.9 14.0  HCT 46.6   < > 41.8 40.0  41.0 41.0  MCV 87.3   < > 88.2  --  89.3  PLT 311   < > 248  --  253   < > = values in this interval not displayed.    Basic Metabolic Panel Recent Labs    16/10/96 0156 04/13/23 1637 04/14/23 0203  NA 134* 137  135 131*  K 3.6 3.5  3.7 3.6  CL 99  --  101   CO2 24  --  24  GLUCOSE 194*  --  146*  BUN 12  --  14  CREATININE 0.93  --  0.93  CALCIUM 8.5*  --  8.1*  MG  --   --  2.1    Liver Function Tests Recent Labs    04/11/23 1650  AST 24  ALT 19  ALKPHOS 77  BILITOT 1.0  PROT 7.4  ALBUMIN 3.6    No results for input(s): "LIPASE", "AMYLASE" in the last 72 hours. Cardiac Enzymes No results for input(s): "CKTOTAL", "CKMB", "CKMBINDEX", "TROPONINI" in the last 72 hours.  BNP: BNP (last 3 results) No results for input(s): "BNP" in the last 8760 hours.  ProBNP (last 3 results) No results for input(s): "PROBNP" in the last 8760 hours.   D-Dimer No results for input(s): "DDIMER" in the last 72 hours. Hemoglobin A1C Recent Labs    04/11/23 1650  HGBA1C 8.0*    Fasting Lipid Panel Recent Labs    04/11/23 1650  CHOL 227*  HDL 29*  LDLCALC 176*  TRIG 111  CHOLHDL 7.8    Thyroid Function Tests No results  for input(s): "TSH", "T4TOTAL", "T3FREE", "THYROIDAB" in the last 72 hours.  Invalid input(s): "FREET3"  Other results:  RHC: 04/13/23: RA = 7 RV = 31/7 PA = 30/11 (19) PCW = 9 Fick cardiac output/index = 6.5/2.7 Thermo CO/CI = 7.1/3.0 PVR = 1.5 WU Ao sat = 93% PA sat = 67%, 68% PAPi = 2.7  Imaging    MR CARDIAC MORPHOLOGY W WO CONTRAST  Result Date: 04/13/2023 CLINICAL DATA:  Myocardial viability evaluation, EF <30% Evaluate for viability Patient had anterior STEMI 04/11/23, troponin >24000.  PTCA to LAD EXAM: MR CARDIA MORPHOLOGY WITHOUT AND WITH CONTRAST; MR CARDIAC VELOCITY FLOW MAPPING TECHNIQUE: The patient was scanned on a 1.5 Tesla GE magnet. A dedicated cardiac coil was used. Functional imaging was done using Fiesta sequences. 2,3, and 4 chamber views were done to assess for RWMA's. Modified Simpson's rule using a short axis stack was used to calculate an ejection fraction on a dedicated work [employer]. The patient received 10mL GADAVIST GADOBUTROL 1 MMOL/ML IV SOLN. After 10  minutes inversion recovery sequences were used to assess for infiltration and scar tissue. Phase contrast velocity encoded images obtained x 2. This examination is tailored for evaluation cardiac anatomy and function and provides very limited assessment of noncardiac structures, which are accordingly not evaluated during interpretation. If there is clinical concern for extracardiac pathology, further evaluation with CT imaging should be considered. FINDINGS: LEFT VENTRICLE: Normal left ventricular chamber size by indexed volume. Mild left ventricular hypertrophy. Moderately reduced left ventricular systolic function. LVEF = 33% There are regional wall motion abnormalities: Hypokinesis of the basal septum, akinesis of the mid to apical ventricular septum. Severe hypokinesis of the mid to apical anterior wall. No definite myocardial edema, global T2 = 53 msec Abnormal first pass perfusion. Subendocardial perfusion defect in the basal anteroseptum, mid-apical anterior wall and septum, consistent with infarct. There is post contrast delayed myocardial enhancement: In the basal to mid anteroseptum, there is evidence of mid-myocardial delayed enhancement, with subendocardial no reflow hypoenhancement suggestive of microvascular hypoperfusion. At the mid ventricle, LGE extends to include the subendocardial anterior wall. At the apex, there is subendocardial and midmyocardial delayed enhancement in the anterior, septal and inferior walls. The apical septum demonstrates prominent subendocardial no reflow adjacent to mid-myocardial LGE. At the distalmost apex, there is involvement of the lateral wall with subendocardial LGE and no reflow hypoenhancement. Areas of no reflow are a poor prognostic marker. Overall, in the LAD distribution, areas of scar appear >50% of the myocardial thickness, suggesting nonviable myocardium, however degree of scar may be overestimated in the setting of acute MI. Viability assessment is typically  more accurate >7 days post acute myocardial infarction. No definite LV thrombus noted. Normal T1 myocardial nulling kinetics suggest against a diagnosis of cardiac amyloidosis. ECV = 35%, nonspecific elevation, likely secondary to acute MI. RIGHT VENTRICLE: Normal right ventricular chamber size. Normal right ventricular wall thickness. Mildly reduced right ventricular systolic function. RVEF = 48% There are no regional wall motion abnormalities. No post contrast delayed myocardial enhancement. ATRIA: Normal left atrial size. Normal right atrial size. VALVES: No significant valvular abnormalities. Tricuspid aortic valve. PERICARDIUM: Normal pericardium. Small pericardial effusion. Prominent epicardial adipose layer. OTHER: No significant extracardiac findings. MEASUREMENTS: Qp/Qs: 0.92 Aortic valve regurgitation: Trace, regurgitant fraction 1% Pulmonary valve regurgitation: Trace, regurgitant fraction 1% Mitral valve regurgitation: Trivial, regurgitant fraction 4.7% Tricuspid valve regurgitation: Trivial, regurgitant fraction 3.8% Left ventricle: LV male LV EF: 33% (normal 49-79%) Absolute volumes: LV EDV: (  normal 95-215 mL) LV ESV: (Normal 25-85 mL) LV SV: 85mL (Normal 61-145 mL) CO: 7.9L/min (Normal 3.4-7.8 L/min) Indexed volumes: LV EDV: 141mL/sq-m (Normal 50-108 mL/sq-m) LV ESV: 40mL/sq-m (Normal 11-47 mL/sq-m) LV SV: 74mL/sq-m (Normal 33-72 mL/sq-m) CI: 3.17L/min/sq-m (Normal 1.8-4.2 L/min/sq-m) Right ventricle: RV male RV EF:  48% (normal 51-80%) Absolute volumes: RV EDV: (Normal 109-217 mL) RV ESV: 84mL (Normal 23-91 mL) RV SV: 78mL (Normal 71-141 mL) CO: 7.2L/min (Normal 2.8-8.8 L/min) Indexed volumes: RV EDV: 70mL/sq-m (Normal 58-109 mL/sq-m) RV ESV: 59mL/sq-m (Normal 12-46 mL/sq-m) RV SV: 63mL/sq-m (Normal 38-71 mL/sq-m) CI: 2.9L/min/sq-m (Normal 1.7-4.2 L/min/sq-m) IMPRESSION: 1. Overall, in the LAD distribution, areas of scar appear >50% of the myocardial thickness, suggesting nonviable  myocardium in the LAD territory, however degree of scar may be overestimated in the setting of acute MI. Viability assessment is typically more accurate >7 days post acute myocardial infarction. Areas of subendocardial no-reflow hypoenhancement noted in LAD distribution, poor prognostic indicator. 2. Normal left ventricular chamber size with moderately reduced function and septal and anterior regional wall motion abnormalities as noted in the findings. LVEF 33%. 3. Normal right ventricular chamber size with mildly reduced RV systolic function, RVEF 48%. 4.  No significant valvular heart disease 5.  Small pericardial effusion. Electronically Signed   By: Weston Brass M.D.   On: 04/13/2023 17:31   MR CARDIAC VELOCITY FLOW MAP  Result Date: 04/13/2023 CLINICAL DATA:  Myocardial viability evaluation, EF <30% Evaluate for viability Patient had anterior STEMI 04/11/23, troponin >24000.  PTCA to LAD EXAM: MR CARDIA MORPHOLOGY WITHOUT AND WITH CONTRAST; MR CARDIAC VELOCITY FLOW MAPPING TECHNIQUE: The patient was scanned on a 1.5 Tesla GE magnet. A dedicated cardiac coil was used. Functional imaging was done using Fiesta sequences. 2,3, and 4 chamber views were done to assess for RWMA's. Modified Simpson's rule using a short axis stack was used to calculate an ejection fraction on a dedicated work [employer], [employer]. The patient received 10mL GADAVIST GADOBUTROL 1 MMOL/ML IV SOLN. After 10 minutes inversion recovery sequences were used to assess for infiltration and scar tissue. Phase contrast velocity encoded images obtained x 2. This examination is tailored for evaluation cardiac anatomy and function and provides very limited assessment of noncardiac structures, which are accordingly not evaluated during interpretation. If there is clinical concern for extracardiac pathology, further evaluation with CT imaging should be considered. FINDINGS: LEFT VENTRICLE: Normal left ventricular chamber size by indexed  volume. Mild left ventricular hypertrophy. Moderately reduced left ventricular systolic function. LVEF = 33% There are regional wall motion abnormalities: Hypokinesis of the basal septum, akinesis of the mid to apical ventricular septum. Severe hypokinesis of the mid to apical anterior wall. No definite myocardial edema, global T2 = 53 msec Abnormal first pass perfusion. Subendocardial perfusion defect in the basal anteroseptum, mid-apical anterior wall and septum, consistent with infarct. There is post contrast delayed myocardial enhancement: In the basal to mid anteroseptum, there is evidence of mid-myocardial delayed enhancement, with subendocardial no reflow hypoenhancement suggestive of microvascular hypoperfusion. At the mid ventricle, LGE extends to include the subendocardial anterior wall. At the apex, there is subendocardial and midmyocardial delayed enhancement in the anterior, septal and inferior walls. The apical septum demonstrates prominent subendocardial no reflow adjacent to mid-myocardial LGE. At the distalmost apex, there is involvement of the lateral wall with subendocardial LGE and no reflow hypoenhancement. Areas of no reflow are a poor prognostic marker. Overall, in the LAD distribution, areas of scar appear >50% of the myocardial  thickness, suggesting nonviable myocardium, however degree of scar may be overestimated in the setting of acute MI. Viability assessment is typically more accurate >7 days post acute myocardial infarction. No definite LV thrombus noted. Normal T1 myocardial nulling kinetics suggest against a diagnosis of cardiac amyloidosis. ECV = 35%, nonspecific elevation, likely secondary to acute MI. RIGHT VENTRICLE: Normal right ventricular chamber size. Normal right ventricular wall thickness. Mildly reduced right ventricular systolic function. RVEF = 48% There are no regional wall motion abnormalities. No post contrast delayed myocardial enhancement. ATRIA: Normal left atrial  size. Normal right atrial size. VALVES: No significant valvular abnormalities. Tricuspid aortic valve. PERICARDIUM: Normal pericardium. Small pericardial effusion. Prominent epicardial adipose layer. OTHER: No significant extracardiac findings. MEASUREMENTS: Qp/Qs: 0.92 Aortic valve regurgitation: Trace, regurgitant fraction 1% Pulmonary valve regurgitation: Trace, regurgitant fraction 1% Mitral valve regurgitation: Trivial, regurgitant fraction 4.7% Tricuspid valve regurgitation: Trivial, regurgitant fraction 3.8% Left ventricle: LV male LV EF: 33% (normal 49-79%) Absolute volumes: LV EDV: (normal 95-215 mL) LV ESV: (Normal 25-85 mL) LV SV: 85mL (Normal 61-145 mL) CO: 7.9L/min (Normal 3.4-7.8 L/min) Indexed volumes: LV EDV: 169mL/sq-m (Normal 50-108 mL/sq-m) LV ESV: 80mL/sq-m (Normal 11-47 mL/sq-m) LV SV: 45mL/sq-m (Normal 33-72 mL/sq-m) CI: 3.17L/min/sq-m (Normal 1.8-4.2 L/min/sq-m) Right ventricle: RV male RV EF:  48% (normal 51-80%) Absolute volumes: RV EDV: (Normal 109-217 mL) RV ESV: 84mL (Normal 23-91 mL) RV SV: 78mL (Normal 71-141 mL) CO: 7.2L/min (Normal 2.8-8.8 L/min) Indexed volumes: RV EDV: 75mL/sq-m (Normal 58-109 mL/sq-m) RV ESV: 65mL/sq-m (Normal 12-46 mL/sq-m) RV SV: 16mL/sq-m (Normal 38-71 mL/sq-m) CI: 2.9L/min/sq-m (Normal 1.7-4.2 L/min/sq-m) IMPRESSION: 1. Overall, in the LAD distribution, areas of scar appear >50% of the myocardial thickness, suggesting nonviable myocardium in the LAD territory, however degree of scar may be overestimated in the setting of acute MI. Viability assessment is typically more accurate >7 days post acute myocardial infarction. Areas of subendocardial no-reflow hypoenhancement noted in LAD distribution, poor prognostic indicator. 2. Normal left ventricular chamber size with moderately reduced function and septal and anterior regional wall motion abnormalities as noted in the findings. LVEF 33%. 3. Normal right ventricular chamber size with mildly  reduced RV systolic function, RVEF 48%. 4.  No significant valvular heart disease 5.  Small pericardial effusion. Electronically Signed   By: Weston Brass M.D.   On: 04/13/2023 17:31   MR CARDIAC VELOCITY FLOW MAP  Result Date: 04/13/2023 CLINICAL DATA:  Myocardial viability evaluation, EF <30% Evaluate for viability Patient had anterior STEMI 04/11/23, troponin >24000.  PTCA to LAD EXAM: MR CARDIA MORPHOLOGY WITHOUT AND WITH CONTRAST; MR CARDIAC VELOCITY FLOW MAPPING TECHNIQUE: The patient was scanned on a 1.5 Tesla GE magnet. A dedicated cardiac coil was used. Functional imaging was done using Fiesta sequences. 2,3, and 4 chamber views were done to assess for RWMA's. Modified Simpson's rule using a short axis stack was used to calculate an ejection fraction on a dedicated work [employer], [employer]. The patient received 10mL GADAVIST GADOBUTROL 1 MMOL/ML IV SOLN. After 10 minutes inversion recovery sequences were used to assess for infiltration and scar tissue. Phase contrast velocity encoded images obtained x 2. This examination is tailored for evaluation cardiac anatomy and function and provides very limited assessment of noncardiac structures, which are accordingly not evaluated during interpretation. If there is clinical concern for extracardiac pathology, further evaluation with CT imaging should be considered. FINDINGS: LEFT VENTRICLE: Normal left ventricular chamber size by indexed volume. Mild left ventricular hypertrophy. Moderately reduced left ventricular systolic function. LVEF =  33% There are regional wall motion abnormalities: Hypokinesis of the basal septum, akinesis of the mid to apical ventricular septum. Severe hypokinesis of the mid to apical anterior wall. No definite myocardial edema, global T2 = 53 msec Abnormal first pass perfusion. Subendocardial perfusion defect in the basal anteroseptum, mid-apical anterior wall and septum, consistent with infarct. There is post contrast  delayed myocardial enhancement: In the basal to mid anteroseptum, there is evidence of mid-myocardial delayed enhancement, with subendocardial no reflow hypoenhancement suggestive of microvascular hypoperfusion. At the mid ventricle, LGE extends to include the subendocardial anterior wall. At the apex, there is subendocardial and midmyocardial delayed enhancement in the anterior, septal and inferior walls. The apical septum demonstrates prominent subendocardial no reflow adjacent to mid-myocardial LGE. At the distalmost apex, there is involvement of the lateral wall with subendocardial LGE and no reflow hypoenhancement. Areas of no reflow are a poor prognostic marker. Overall, in the LAD distribution, areas of scar appear >50% of the myocardial thickness, suggesting nonviable myocardium, however degree of scar may be overestimated in the setting of acute MI. Viability assessment is typically more accurate >7 days post acute myocardial infarction. No definite LV thrombus noted. Normal T1 myocardial nulling kinetics suggest against a diagnosis of cardiac amyloidosis. ECV = 35%, nonspecific elevation, likely secondary to acute MI. RIGHT VENTRICLE: Normal right ventricular chamber size. Normal right ventricular wall thickness. Mildly reduced right ventricular systolic function. RVEF = 48% There are no regional wall motion abnormalities. No post contrast delayed myocardial enhancement. ATRIA: Normal left atrial size. Normal right atrial size. VALVES: No significant valvular abnormalities. Tricuspid aortic valve. PERICARDIUM: Normal pericardium. Small pericardial effusion. Prominent epicardial adipose layer. OTHER: No significant extracardiac findings. MEASUREMENTS: Qp/Qs: 0.92 Aortic valve regurgitation: Trace, regurgitant fraction 1% Pulmonary valve regurgitation: Trace, regurgitant fraction 1% Mitral valve regurgitation: Trivial, regurgitant fraction 4.7% Tricuspid valve regurgitation: Trivial, regurgitant fraction  3.8% Left ventricle: LV male LV EF: 33% (normal 49-79%) Absolute volumes: LV EDV: (normal 95-215 mL) LV ESV: (Normal 25-85 mL) LV SV: 85mL (Normal 61-145 mL) CO: 7.9L/min (Normal 3.4-7.8 L/min) Indexed volumes: LV EDV: 125mL/sq-m (Normal 50-108 mL/sq-m) LV ESV: 82mL/sq-m (Normal 11-47 mL/sq-m) LV SV: 23mL/sq-m (Normal 33-72 mL/sq-m) CI: 3.17L/min/sq-m (Normal 1.8-4.2 L/min/sq-m) Right ventricle: RV male RV EF:  48% (normal 51-80%) Absolute volumes: RV EDV: (Normal 109-217 mL) RV ESV: 84mL (Normal 23-91 mL) RV SV: 78mL (Normal 71-141 mL) CO: 7.2L/min (Normal 2.8-8.8 L/min) Indexed volumes: RV EDV: 22mL/sq-m (Normal 58-109 mL/sq-m) RV ESV: 76mL/sq-m (Normal 12-46 mL/sq-m) RV SV: 68mL/sq-m (Normal 38-71 mL/sq-m) CI: 2.9L/min/sq-m (Normal 1.7-4.2 L/min/sq-m) IMPRESSION: 1. Overall, in the LAD distribution, areas of scar appear >50% of the myocardial thickness, suggesting nonviable myocardium in the LAD territory, however degree of scar may be overestimated in the setting of acute MI. Viability assessment is typically more accurate >7 days post acute myocardial infarction. Areas of subendocardial no-reflow hypoenhancement noted in LAD distribution, poor prognostic indicator. 2. Normal left ventricular chamber size with moderately reduced function and septal and anterior regional wall motion abnormalities as noted in the findings. LVEF 33%. 3. Normal right ventricular chamber size with mildly reduced RV systolic function, RVEF 48%. 4.  No significant valvular heart disease 5.  Small pericardial effusion. Electronically Signed   By: Weston Brass M.D.   On: 04/13/2023 17:31   CARDIAC CATHETERIZATION  Result Date: 04/13/2023 Findings: RA = 7 RV = 31/7 PA = 30/11 (19) PCW = 9 Fick cardiac output/index = 6.5/2.7 Thermo CO/CI = 7.1/3.0 PVR = 1.5  WU Ao sat = 93% PA sat = 67%, 68% PAPi = 2.7 Assessment: 1. Normal RHC Arvilla Meres, MD 4:47 PM  VAS US DOPPLER PRE CABG  Result Date:  04/13/2023 PREOPERATIVE VASCULAR EVALUATION Patient Name:  Jacob Gonzales  Date of Exam:   04/13/2023 Medical Rec #: [id number]    Accession #:    [id number] Date of Birth: 1956/08/22   Patient Gender: M Patient Age:   44 years Exam Location:  [facility] Procedure:      VAS US DOPPLER PRE CABG Referring Phys: Evelene Croon --------------------------------------------------------------------------------  Indications:      Pre-CABG. Risk Factors:     Hypertension, Diabetes, coronary artery disease, prior CVA. Comparison Study: Previous carotid study 07/11/2015. Performing Technologist: McKayla Maag RVT, [place]  Examination Guidelines: A complete evaluation includes B-mode imaging, spectral Doppler, color Doppler, and power Doppler as needed of all accessible portions of each vessel. Bilateral testing is considered an integral part of a complete examination. Limited examinations for reoccurring indications may be performed as noted.  Right Carotid Findings: +----------+--------+--------+--------+-----------+------------------+           PSV cm/sEDV cm/sStenosisDescribe   Comments           +----------+--------+--------+--------+-----------+------------------+ CCA Prox  82      13                                            +----------+--------+--------+--------+-----------+------------------+ CCA Distal67      15                         intimal thickening +----------+--------+--------+--------+-----------+------------------+ ICA Prox  57      15      1-39%   homogeneous                   +----------+--------+--------+--------+-----------+------------------+ ICA Mid   47      18                                            +----------+--------+--------+--------+-----------+------------------+ ICA Distal43      16                                            +----------+--------+--------+--------+-----------+------------------+ +----------+--------+-------+----------------+------------+            PSV cm/sEDV cmsDescribe        Arm Pressure +----------+--------+-------+----------------+------------+ Subclavian113            Multiphasic, WNL             +----------+--------+-------+----------------+------------+ +---------+--------+--+--------+--+---------+ VertebralPSV cm/s38EDV cm/s16Antegrade +---------+--------+--+--------+--+---------+ Left Carotid Findings: +----------+--------+--------+--------+-------------------------+--------+           PSV cm/sEDV cm/sStenosisDescribe                 Comments +----------+--------+--------+--------+-------------------------+--------+ CCA Prox  80      19                                                +----------+--------+--------+--------+-------------------------+--------+ CCA Distal77  20                                                +----------+--------+--------+--------+-------------------------+--------+ ICA Prox  66      13      1-39%   homogeneous and irregular         +----------+--------+--------+--------+-------------------------+--------+ ICA Mid   71      27                                                +----------+--------+--------+--------+-------------------------+--------+ ICA Distal47      14                                                +----------+--------+--------+--------+-------------------------+--------+ ECA       144     0                                                 +----------+--------+--------+--------+-------------------------+--------+ +----------+--------+--------+----------------+------------+ SubclavianPSV cm/sEDV cm/sDescribe        Arm Pressure +----------+--------+--------+----------------+------------+           128             Multiphasic, WNL             +----------+--------+--------+----------------+------------+ +---------+--------+--+--------+-+ VertebralPSV cm/s21EDV cm/s8 +---------+--------+--+--------+-+  ABI Findings:  +---------+------------------+-----+---------+--------+ Right    Rt Pressure (mmHg)IndexWaveform Comment  +---------+------------------+-----+---------+--------+ Brachial 106                    triphasic         +---------+------------------+-----+---------+--------+ PTA      176               1.66 biphasic          +---------+------------------+-----+---------+--------+ DP       127               1.20 biphasic          +---------+------------------+-----+---------+--------+ Great Toe98                0.92 Normal            +---------+------------------+-----+---------+--------+ +---------+------------------+-----+---------+---------------------------------+ Left     Lt Pressure (mmHg)IndexWaveform Comment                           +---------+------------------+-----+---------+---------------------------------+ Brachial                        triphasicUnable to obtian pressure due to                                           IV placement                      +---------+------------------+-----+---------+---------------------------------+ PTA      189  1.78 triphasic                                  +---------+------------------+-----+---------+---------------------------------+ DP       141               1.33 triphasic                                  +---------+------------------+-----+---------+---------------------------------+ Great Toe82                0.77 Normal                                     +---------+------------------+-----+---------+---------------------------------+ +-------+---------------+----------------+ ABI/TBIToday's ABI/TBIPrevious ABI/TBI +-------+---------------+----------------+ Right  1.66/ 0.92                      +-------+---------------+----------------+ Left   1.78/ 0.77                      +-------+---------------+----------------+  Right Doppler Findings:  +--------+--------+-----+---------+--------+ Site    PressureIndexDoppler  Comments +--------+--------+-----+---------+--------+ [id number]          triphasic         +--------+--------+-----+---------+--------+ Radial               triphasic         +--------+--------+-----+---------+--------+ Ulnar                triphasic         +--------+--------+-----+---------+--------+  Left Doppler Findings: +-----------+--------+-----+---------+----------------------------------------+ Site       PressureIndexDoppler  Comments                                 +-----------+--------+-----+---------+----------------------------------------+ Brachial                triphasicUnable to obtian pressure due to IV                                       placement                                +-----------+--------+-----+---------+----------------------------------------+ Ulnar                   triphasic                                         +-----------+--------+-----+---------+----------------------------------------+ Palmar Arch             triphasic                                         +-----------+--------+-----+---------+----------------------------------------+   Summary: Right Carotid: Velocities in the right ICA are consistent with a 1-39% stenosis. Left Carotid: Velocities in the left ICA are consistent with a 1-39% stenosis. Vertebrals:  Bilateral vertebral arteries demonstrate antegrade flow. Subclavians: Normal flow hemodynamics were seen in bilateral subclavian  arteries. Right ABI: Resting right ankle-brachial index indicates noncompressible right lower extremity arteries. The right toe-brachial index is normal. Left ABI: Resting left ankle-brachial index indicates noncompressible left lower extremity arteries. The left toe-brachial index is normal. Right Upper Extremity: Doppler waveforms decrease <50% with right radial compression. Doppler  waveforms remain within normal limits with right ulnar compression. Left Upper Extremity: Doppler waveforms decrease <50% w left radial compression. Doppler waveform obliterate with left ulnar compression.     Preliminary      Medications:     Scheduled Medications:  aspirin  81 mg Oral Daily   Or   aspirin  300 mg Rectal Daily   [START ON 04/15/2023] aspirin  81 mg Oral Pre-Cath   atorvastatin  80 mg Oral Daily   Chlorhexidine Gluconate Cloth  6 each Topical Daily   folic acid  1 mg Oral Daily   furosemide  40 mg Oral Daily   insulin aspart  0-15 Units Subcutaneous TID WC   insulin glargine-yfgn  10 Units Subcutaneous QHS   multivitamin with minerals  1 tablet Oral Daily   potassium chloride  40 mEq Oral BID   sodium chloride flush  3 mL Intravenous Q12H   sodium chloride flush  3 mL Intravenous Q12H   thiamine  100 mg Oral Daily   ticagrelor  90 mg Oral BID    Infusions:  sodium chloride     sodium chloride     [START ON 04/15/2023] sodium chloride     heparin 2,100 Units/hr (04/14/23 0723)    PRN Medications: sodium chloride, sodium chloride, acetaminophen, LORazepam, ondansetron (ZOFRAN) IV, sodium chloride flush, sodium chloride flush    Patient Profile   67 y.o. male with history of CAD and prior MI in 2022, HFmrEF, DM II, HTN, hx CVA. Admitted with anterior STEMI and acute on chronic systolic CHF.   Assessment/Plan   Acute on chronic systolic CHF/ICM: -EF 40-45% following MI in 2022 -Echo this admit EF 25-30%. In setting of anterior STEMI. Severe LM, LAD (treated with PTCA) and ramus disease (small), moderate LCx.  -Hold spiro and losartan for now. -Add bidil 1/2 tab TID for afterload reduction -RHC (04/13/23) w/ RA 7, PCWP 9, CI 2.7.  -Euvolemic on exam today.  - Multidisciplinary discussion yesterday; CMR w/ nonviable anterior myocardium. Will plan for [facility] & PCI of the Lcx today.  - Start GDMT tomorrow.   2. Anterior STEMI/CAD -Hx NSTEMI in 2022 s/p PCI/DES  to OM2 -Now presenting with anterior STEMI. High grade d LM, ostial LAD (treated with PTCA), ostial D1 and ostial ramus disease. Moderate LCx. -Continue aspirin, heparin, lipitor - Brilinta 90mg  BID - Plan for PCI today, see above.  -No recurrent angina   3. Hypertension -Well controled; continue bidil -Start ARB/ARNI tomorrow.    4. DM II -A1c 8.0% -SSI + semglee   5. Hyperlipidemia -LDL 176, Lipoprotein a pending -Started Atorvastatin 80 -May benefit from PCSK-9 inhibitor as outpatient   Length of Stay: 3  Dorthula Nettles, DO  04/14/2023, 8:41 AM  Advanced Heart Failure Team Pager [phone] (M-F; 7a - 5p)  Please contact [facility] for night-coverage after hours (5p -7a ) and weekends on [email or web address]

## 2023-04-15 DIAGNOSIS — I5021 Acute systolic (congestive) heart failure: Secondary | ICD-10-CM | POA: Diagnosis not present

## 2023-04-15 DIAGNOSIS — I2102 ST elevation (STEMI) myocardial infarction involving left anterior descending coronary artery: Secondary | ICD-10-CM | POA: Diagnosis not present

## 2023-04-15 LAB — BASIC METABOLIC PANEL
Anion gap: 14 (ref 5–15)
BUN: 18 mg/dL (ref 8–23)
CO2: 22 mmol/L (ref 22–32)
Calcium: 8.6 mg/dL — ABNORMAL LOW (ref 8.9–10.3)
Chloride: 98 mmol/L (ref 98–111)
Creatinine, Ser: 0.96 mg/dL (ref 0.61–1.24)
GFR, Estimated: >60 mL/min (ref >60)
Glucose, Bld: 125 mg/dL — ABNORMAL HIGH (ref 70–99)
Potassium: 4.1 mmol/L (ref 3.5–5.1)
Sodium: 134 mmol/L — ABNORMAL LOW (ref 135–145)

## 2023-04-15 LAB — GLUCOSE, CAPILLARY
Glucose-Capillary: 120 mg/dL — ABNORMAL HIGH (ref 70–99)
Glucose-Capillary: 146 mg/dL — ABNORMAL HIGH (ref 70–99)
Glucose-Capillary: 115 mg/dL — ABNORMAL HIGH (ref 70–99)
Glucose-Capillary: 143 mg/dL — ABNORMAL HIGH (ref 70–99)

## 2023-04-15 LAB — CBC
HCT: 42 % (ref 39.0–52.0)
Hemoglobin: 14.2 g/dL (ref 13.0–17.0)
MCH: 29.8 pg (ref 26.0–34.0)
MCHC: 33.8 g/dL (ref 30.0–36.0)
MCV: 88.1 fL (ref 80.0–100.0)
Platelets: 249 10*3/uL (ref 150–400)
RBC: 4.77 MIL/uL (ref 4.22–5.81)
RDW: 14.1 % (ref 11.5–15.5)
WBC: 12.5 10*3/uL — ABNORMAL HIGH (ref 4.0–10.5)
nRBC: 0 % (ref 0.0–0.2)

## 2023-04-15 MED ORDER — SACUBITRIL-VALSARTAN 24-26 MG PO TABS
1.0000 | ORAL_TABLET | Freq: Two times a day (BID) | ORAL | Status: DC
  Administered 2023-04-15 – 2023-04-17 (×4): 1 via ORAL
  Filled 2023-04-15 (×6): qty 1

## 2023-04-15 NOTE — Progress Notes (Addendum)
CARDIAC REHAB PHASE I   PRE:  Rate/Rhythm: 93 SR  BP:  Sitting: 97/60  Came to walk pt. Pt was feeling dizzy. RN aware. Ed given to pt. Discussed restrictions, MI booklet, NTG use, ASA and Brilinta, stent, site care, heart healthy diet, and exercise guidelines. Unsure how much pt heard ed as he was falling asleep while I was talking. Pt left in bed w/ call bell in reach. Will refer to [facility] [place].    [id number] Joya San, MS, ACSM-CEP 04/15/2023 8:53 AM

## 2023-04-15 NOTE — Progress Notes (Signed)
Advanced Heart Failure Rounding Note  PCP-Cardiologist: Lance Muss, MD   Subjective:    - No complaints this AM - No CP, SOB or events overnight - IVUS guided PCI of distal left main into LCX with cutting balloon, lithotripsy, and stent placement yesterday   Objective:   Weight Range: 122.6 kg Body mass index is 38.78 kg/m.   Vital Signs:   Temp:  [93 F (33.9 C)-98.5 F (36.9 C)] 93 F (33.9 C) (05/11 0830) Pulse Rate:  [71-97] 90 (05/11 0800) Resp:  [10-36] 14 (05/11 0830) BP: (96-133)/(51-93) 97/60 (05/11 0830) SpO2:  [90 %-96 %] 90 % (05/11 0800) Last BM Date :  (PTA)  Weight change: Filed Weights   04/12/23 0500 04/13/23 0500 04/14/23 0013  Weight: 125.6 kg 124.8 kg 122.6 kg    Intake/Output:   Intake/Output Summary (Last 24 hours) at 04/15/2023 0946 Last data filed at 04/15/2023 0600 Gross per 24 hour  Intake 87.52 ml  Output 2300 ml  Net -2212.48 ml      Physical Exam    General:  Well appearing. Lying comfortably in bed HEENT: Normal Neck: Supple. JVP difficult d/t thick neck. Carotids 2+ bilat; no bruits.  Cor: PMI nondisplaced. Regular rate & rhythm. No rubs, gallops or murmurs. Lungs: Clear Abdomen: obese, nontender, nondistended.  Extremities: No edema, warm to touch. Neuro: Alert & orientedx3, cranial nerves grossly intact. moves all 4 extremities w/o difficulty. Affect pleasant   Telemetry   Sr/ST 90s-100s  Labs    CBC Recent Labs    04/14/23 0203 04/15/23 0121  WBC 13.6* 12.5*  HGB 14.0 14.2  HCT 41.0 42.0  MCV 89.3 88.1  PLT 253 249   Basic Metabolic Panel Recent Labs    16/10/96 0203 04/15/23 0121  NA 131* 134*  K 3.6 4.1  CL 101 98  CO2 24 22  GLUCOSE 146* 125*  BUN 14 18  CREATININE 0.93 0.96  CALCIUM 8.1* 8.6*  MG 2.1  --    Liver Function Tests No results for input(s): "AST", "ALT", "ALKPHOS", "BILITOT", "PROT", "ALBUMIN" in the last 72 hours. No results for input(s): "LIPASE", "AMYLASE" in the  last 72 hours. Cardiac Enzymes No results for input(s): "CKTOTAL", "CKMB", "CKMBINDEX", "TROPONINI" in the last 72 hours.  BNP: BNP (last 3 results) No results for input(s): "BNP" in the last 8760 hours.  ProBNP (last 3 results) No results for input(s): "PROBNP" in the last 8760 hours.   D-Dimer No results for input(s): "DDIMER" in the last 72 hours. Hemoglobin A1C No results for input(s): "HGBA1C" in the last 72 hours. Fasting Lipid Panel No results for input(s): "CHOL", "HDL", "LDLCALC", "TRIG", "CHOLHDL", "LDLDIRECT" in the last 72 hours. Thyroid Function Tests No results for input(s): "TSH", "T4TOTAL", "T3FREE", "THYROIDAB" in the last 72 hours.  Invalid input(s): "FREET3"  Other results:  RHC: 04/13/23: RA = 7 RV = 31/7 PA = 30/11 (19) PCW = 9 Fick cardiac output/index = 6.5/2.7 Thermo CO/CI = 7.1/3.0 PVR = 1.5 WU Ao sat = 93% PA sat = 67%, 68% PAPi = 2.7  Imaging    CARDIAC CATHETERIZATION  Result Date: 04/14/2023   Dist LM lesion is 90% stenosed.   Ost Cx lesion is 65% stenosed.   Ost LAD lesion is 95% stenosed.   Dist LAD lesion is 30% stenosed.   Ramus lesion is 80% stenosed.   1st Diag lesion is 80% stenosed.   2nd Mrg lesion is 20% stenosed.   A stent was successfully placed.  A stent was successfully placed.   Post intervention, there is a 0% residual stenosis.   Post intervention, there is a 0% residual stenosis. 1.  IVUS guided PCI of distal left main into left circumflex using Cutting Balloon and coronary lithotripsy.  Given the angulation between the left main and left circumflex, a 4.5 x 20 mm Megatron stent was used (due to this platform's increased radial strength).  The minimal stent area was greater than 7 mm. 2.  LVEDP of 25 mmHg. Recommendation: The results were reviewed with Dr. Gasper Lloyd.  Dual antiplatelet therapy for at least 1 year and goal-directed medical therapy for ischemic cardiomyopathy will be pursued.     Medications:     Scheduled  Medications:  aspirin  81 mg Oral Daily   atorvastatin  80 mg Oral Daily   Chlorhexidine Gluconate Cloth  6 each Topical Daily   folic acid  1 mg Oral Daily   furosemide  40 mg Oral Daily   insulin aspart  0-15 Units Subcutaneous TID WC   insulin glargine-yfgn  10 Units Subcutaneous QHS   multivitamin with minerals  1 tablet Oral Daily   potassium chloride  40 mEq Oral BID   sodium chloride flush  3 mL Intravenous Q12H   sodium chloride flush  3 mL Intravenous Q12H   thiamine  100 mg Oral Daily   ticagrelor  90 mg Oral BID    Infusions:  sodium chloride     sodium chloride      PRN Medications: sodium chloride, sodium chloride, acetaminophen, acetaminophen, LORazepam, ondansetron (ZOFRAN) IV, ondansetron (ZOFRAN) IV, sodium chloride flush, sodium chloride flush    Patient Profile   67 y.o. male with history of CAD and prior MI in 2022, HFmrEF, DM II, HTN, hx CVA. Admitted with anterior STEMI and acute on chronic systolic CHF.   Assessment/Plan   Acute on chronic systolic CHF/ICM: -EF 40-45% following MI in 2022 -Echo this admit EF 25-30%. In setting of anterior STEMI. Severe LM, LAD (treated with PTCA) and ramus disease (small), moderate LCx.  -Hold spiro and losartan for now. -Add bidil 1/2 tab TID for afterload reduction -RHC (04/13/23) w/ RA 7, PCWP 9, CI 2.7.  -Euvolemic on exam today.  - Multidisciplinary discussion; CMR w/ nonviable anterior myocardium.  - s/p PCI of LM and LCX yesterday  - Start GDMT today: entresto   2. Anterior STEMI/CAD -Hx NSTEMI in 2022 s/p PCI/DES to OM2 -Now presenting with anterior STEMI. High grade d LM, ostial LAD (treated with PTCA), ostial D1 and ostial ramus disease. Moderate LCx. -Continue aspirin, heparin, lipitor - Brilinta 90mg  BID - ASA 81 - will plan to add metoprolol XL vs carvedilol as BP permits -No recurrent angina   3. Hypertension -Well controled - Bidil held; will prioratize GDMT -Start entresto today    4. DM  II -A1c 8.0% -SSI + semglee   5. Hyperlipidemia -LDL 176, Lipoprotein a 18.4 -Started Atorvastatin 80 -May benefit from PCSK-9 inhibitor as outpatient   Length of Stay: 4  Maurice Small, MD  04/15/2023, 9:46 AM  Advanced Heart Failure Team Pager [phone] (M-F; 7a - 5p)  Please contact [facility] for night-coverage after hours (5p -7a ) and weekends on [email or web address]

## 2023-04-16 DIAGNOSIS — I2102 ST elevation (STEMI) myocardial infarction involving left anterior descending coronary artery: Secondary | ICD-10-CM | POA: Diagnosis not present

## 2023-04-16 DIAGNOSIS — I5021 Acute systolic (congestive) heart failure: Secondary | ICD-10-CM | POA: Diagnosis not present

## 2023-04-16 LAB — BASIC METABOLIC PANEL
Anion gap: 12 (ref 5–15)
BUN: 16 mg/dL (ref 8–23)
CO2: 22 mmol/L (ref 22–32)
Calcium: 8.6 mg/dL — ABNORMAL LOW (ref 8.9–10.3)
Chloride: 98 mmol/L (ref 98–111)
Creatinine, Ser: 0.99 mg/dL (ref 0.61–1.24)
GFR, Estimated: >60 mL/min (ref >60)
Glucose, Bld: 149 mg/dL — ABNORMAL HIGH (ref 70–99)
Potassium: 4.2 mmol/L (ref 3.5–5.1)
Sodium: 132 mmol/L — ABNORMAL LOW (ref 135–145)

## 2023-04-16 LAB — GLUCOSE, CAPILLARY
Glucose-Capillary: 151 mg/dL — ABNORMAL HIGH (ref 70–99)
Glucose-Capillary: 188 mg/dL — ABNORMAL HIGH (ref 70–99)
Glucose-Capillary: 122 mg/dL — ABNORMAL HIGH (ref 70–99)
Glucose-Capillary: 166 mg/dL — ABNORMAL HIGH (ref 70–99)

## 2023-04-16 LAB — CBC
HCT: 43.1 % (ref 39.0–52.0)
Hemoglobin: 14.6 g/dL (ref 13.0–17.0)
MCH: 30.3 pg (ref 26.0–34.0)
MCHC: 33.9 g/dL (ref 30.0–36.0)
MCV: 89.4 fL (ref 80.0–100.0)
Platelets: 251 10*3/uL (ref 150–400)
RBC: 4.82 MIL/uL (ref 4.22–5.81)
RDW: 14 % (ref 11.5–15.5)
WBC: 12.6 10*3/uL — ABNORMAL HIGH (ref 4.0–10.5)
nRBC: 0 % (ref 0.0–0.2)

## 2023-04-16 MED ORDER — METOPROLOL TARTRATE 12.5 MG HALF TABLET
12.5000 mg | ORAL_TABLET | Freq: Two times a day (BID) | ORAL | Status: DC
  Administered 2023-04-16 (×2): 12.5 mg via ORAL
  Filled 2023-04-16 (×2): qty 1

## 2023-04-16 MED ORDER — EPINEPHRINE 1 MG/10ML IJ SOSY
PREFILLED_SYRINGE | INTRAMUSCULAR | Status: AC
  Filled 2023-04-16: qty 10

## 2023-04-16 MED ORDER — EMPAGLIFLOZIN 10 MG PO TABS: 10.0000 mg | ORAL_TABLET | Freq: Every day | ORAL | Status: DC

## 2023-04-16 MED ORDER — DAPAGLIFLOZIN PROPANEDIOL 10 MG PO TABS
10.0000 mg | ORAL_TABLET | Freq: Every day | ORAL | Status: DC
  Administered 2023-04-16 – 2023-04-17 (×2): 10 mg via ORAL
  Filled 2023-04-16 (×2): qty 1

## 2023-04-16 NOTE — Progress Notes (Signed)
Advanced Heart Failure Rounding Note  PCP-Cardiologist: Lance Muss, MD   Subjective:    - No complaints this AM - No CP, SOB or events overnight - IVUS guided PCI of distal left main into LCX with cutting balloon, lithotripsy, and stent placement yesterday   Objective:   Weight Range: 121.3 kg Body mass index is 38.37 kg/m.   Vital Signs:   Temp:  [93 F (33.9 C)-99.3 F (37.4 C)] 98.6 F (37 C) (05/12 0400) Pulse Rate:  [84-100] 93 (05/12 0700) Resp:  [13-34] 27 (05/12 0630) BP: (93-130)/(51-89) 118/72 (05/12 0651) SpO2:  [89 %-96 %] 96 % (05/12 0700) Weight:  [121.3 kg] 121.3 kg (05/12 0651) Last BM Date :  (PTA)  Weight change: Filed Weights   04/13/23 0500 04/14/23 0013 04/16/23 0651  Weight: 124.8 kg 122.6 kg 121.3 kg    Intake/Output:   Intake/Output Summary (Last 24 hours) at 04/16/2023 0728 Last data filed at 04/16/2023 0409 Gross per 24 hour  Intake 120 ml  Output 900 ml  Net -780 ml      Physical Exam    General:  Well appearing. Lying comfortably in bed HEENT: Normal Neck: Supple. JVP difficult d/t thick neck. Carotids 2+ bilat; no bruits.  Cor: PMI nondisplaced. Regular rate & rhythm. No rubs, gallops or murmurs. Lungs: Clear Abdomen: obese, nontender, nondistended.  Extremities: No edema, warm to touch. Neuro: Alert & orientedx3, cranial nerves grossly intact. moves all 4 extremities w/o difficulty. Affect pleasant   Telemetry   Sr/ST 90s-100s  Labs    CBC Recent Labs    04/15/23 0121 04/16/23 0205  WBC 12.5* 12.6*  HGB 14.2 14.6  HCT 42.0 43.1  MCV 88.1 89.4  PLT 249 251   Basic Metabolic Panel Recent Labs    16/10/96 0203 04/15/23 0121 04/16/23 0205  NA 131* 134* 132*  K 3.6 4.1 4.2  CL 101 98 98  CO2 24 22 22   GLUCOSE 146* 125* 149*  BUN 14 18 16   CREATININE 0.93 0.96 0.99  CALCIUM 8.1* 8.6* 8.6*  MG 2.1  --   --    Liver Function Tests No results for input(s): "AST", "ALT", "ALKPHOS", "BILITOT",  "PROT", "ALBUMIN" in the last 72 hours. No results for input(s): "LIPASE", "AMYLASE" in the last 72 hours. Cardiac Enzymes No results for input(s): "CKTOTAL", "CKMB", "CKMBINDEX", "TROPONINI" in the last 72 hours.  BNP: BNP (last 3 results) No results for input(s): "BNP" in the last 8760 hours.  ProBNP (last 3 results) No results for input(s): "PROBNP" in the last 8760 hours.   D-Dimer No results for input(s): "DDIMER" in the last 72 hours. Hemoglobin A1C No results for input(s): "HGBA1C" in the last 72 hours. Fasting Lipid Panel No results for input(s): "CHOL", "HDL", "LDLCALC", "TRIG", "CHOLHDL", "LDLDIRECT" in the last 72 hours. Thyroid Function Tests No results for input(s): "TSH", "T4TOTAL", "T3FREE", "THYROIDAB" in the last 72 hours.  Invalid input(s): "FREET3"  Other results:  RHC: 04/13/23: RA = 7 RV = 31/7 PA = 30/11 (19) PCW = 9 Fick cardiac output/index = 6.5/2.7 Thermo CO/CI = 7.1/3.0 PVR = 1.5 WU Ao sat = 93% PA sat = 67%, 68% PAPi = 2.7  Imaging    No results found.   Medications:     Scheduled Medications:  aspirin  81 mg Oral Daily   atorvastatin  80 mg Oral Daily   Chlorhexidine Gluconate Cloth  6 each Topical Daily   folic acid  1 mg Oral Daily  furosemide  40 mg Oral Daily   insulin aspart  0-15 Units Subcutaneous TID WC   insulin glargine-yfgn  10 Units Subcutaneous QHS   multivitamin with minerals  1 tablet Oral Daily   potassium chloride  40 mEq Oral BID   sacubitril-valsartan  1 tablet Oral BID   sodium chloride flush  3 mL Intravenous Q12H   sodium chloride flush  3 mL Intravenous Q12H   thiamine  100 mg Oral Daily   ticagrelor  90 mg Oral BID    Infusions:  sodium chloride     sodium chloride      PRN Medications: sodium chloride, sodium chloride, acetaminophen, acetaminophen, LORazepam, ondansetron (ZOFRAN) IV, ondansetron (ZOFRAN) IV, sodium chloride flush, sodium chloride flush    Patient Profile   67 y.o. male  with history of CAD and prior MI in 2022, HFmrEF, DM II, HTN, hx CVA. Admitted with anterior STEMI and acute on chronic systolic CHF.   Assessment/Plan   Acute on chronic systolic CHF/ICM: -EF 40-45% following MI in 2022 -Echo this admit EF 25-30%. In setting of anterior STEMI. Severe LM, LAD (treated with PTCA) and ramus disease (small), moderate LCx.  -Hold spiro and losartan for now. -bidil added days ago was D/C'd -RHC (04/13/23) w/ RA 7, PCWP 9, CI 2.7.  -Euvolemic on exam today.  - Multidisciplinary discussion; CMR w/ nonviable anterior myocardium.  - s/p PCI of LM and LCX yesterday  - Entresto started 5/11 PM. BP has tolerated well so far - add empagliflozin and metoprolol today. transition metop to XL on discharge - ok to transfer to floor and get out of bed   2. Anterior STEMI/CAD -Hx NSTEMI in 2022 s/p PCI/DES to OM2 -Now presenting with anterior STEMI. High grade d LM, ostial LAD (treated with PTCA), ostial D1 and ostial ramus disease. Moderate LCx. -Continue aspirin, heparin, lipitor - Brilinta 90mg  BID - ASA 81 - will add metoprolol XL today - No recurrent angina   3. Hypertension - Well controled - Bidil held; will prioratize GDMT - Entresto started yesterday evening, tolerated well   4. DM II -A1c 8.0% -SSI + semglee   5. Hyperlipidemia -LDL 176, Lipoprotein a 18.4 -Started Atorvastatin 80 -May benefit from PCSK-9 inhibitor as outpatient   Length of Stay: 5  Maurice Small, MD  04/16/2023, 7:28 AM  Advanced Heart Failure Team Pager [phone] (M-F; 7a - 5p)  Please contact [facility] for night-coverage after hours (5p -7a ) and weekends on [email or web address]

## 2023-04-17 ENCOUNTER — Encounter (HOSPITAL_COMMUNITY): Payer: Self-pay | Admitting: Internal Medicine

## 2023-04-17 ENCOUNTER — Other Ambulatory Visit (HOSPITAL_COMMUNITY): Payer: Self-pay

## 2023-04-17 DIAGNOSIS — I5043 Acute on chronic combined systolic (congestive) and diastolic (congestive) heart failure: Secondary | ICD-10-CM | POA: Diagnosis not present

## 2023-04-17 LAB — CBC
HCT: 44.5 % (ref 39.0–52.0)
Hemoglobin: 15.2 g/dL (ref 13.0–17.0)
MCH: 30.2 pg (ref 26.0–34.0)
MCHC: 34.2 g/dL (ref 30.0–36.0)
MCV: 88.5 fL (ref 80.0–100.0)
Platelets: 314 10*3/uL (ref 150–400)
RBC: 5.03 MIL/uL (ref 4.22–5.81)
RDW: 13.9 % (ref 11.5–15.5)
WBC: 13.6 10*3/uL — ABNORMAL HIGH (ref 4.0–10.5)
nRBC: 0 % (ref 0.0–0.2)

## 2023-04-17 LAB — BASIC METABOLIC PANEL
Anion gap: 11 (ref 5–15)
BUN: 19 mg/dL (ref 8–23)
CO2: 23 mmol/L (ref 22–32)
Calcium: 8.8 mg/dL — ABNORMAL LOW (ref 8.9–10.3)
Chloride: 99 mmol/L (ref 98–111)
Creatinine, Ser: 1.04 mg/dL (ref 0.61–1.24)
GFR, Estimated: >60 mL/min (ref >60)
Glucose, Bld: 164 mg/dL — ABNORMAL HIGH (ref 70–99)
Potassium: 4.6 mmol/L (ref 3.5–5.1)
Sodium: 133 mmol/L — ABNORMAL LOW (ref 135–145)

## 2023-04-17 LAB — GLUCOSE, CAPILLARY
Glucose-Capillary: 132 mg/dL — ABNORMAL HIGH (ref 70–99)
Glucose-Capillary: 165 mg/dL — ABNORMAL HIGH (ref 70–99)
Glucose-Capillary: 176 mg/dL — ABNORMAL HIGH (ref 70–99)

## 2023-04-17 SURGERY — CORONARY ARTERY BYPASS GRAFTING (CABG)
Anesthesia: General | Site: Chest

## 2023-04-17 MED ORDER — DAPAGLIFLOZIN PROPANEDIOL 10 MG PO TABS
10.0000 mg | ORAL_TABLET | Freq: Every day | ORAL | 3 refills | Status: DC
  Filled 2023-04-17: qty 30, 30d supply, fill #0

## 2023-04-17 MED ORDER — METOPROLOL SUCCINATE ER 25 MG PO TB24
25.0000 mg | ORAL_TABLET | Freq: Every day | ORAL | Status: DC
  Administered 2023-04-17: 25 mg via ORAL
  Filled 2023-04-17: qty 1

## 2023-04-17 MED ORDER — ASPIRIN 81 MG PO CHEW
81.0000 mg | CHEWABLE_TABLET | Freq: Every day | ORAL | 3 refills | Status: DC
  Filled 2023-04-17: qty 30, 30d supply, fill #0

## 2023-04-17 MED ORDER — FUROSEMIDE 40 MG PO TABS
40.0000 mg | ORAL_TABLET | Freq: Every day | ORAL | 3 refills | Status: DC
  Filled 2023-04-17: qty 30, 30d supply, fill #0

## 2023-04-17 MED ORDER — NITROGLYCERIN 0.4 MG SL SUBL
0.4000 mg | SUBLINGUAL_TABLET | SUBLINGUAL | 0 refills | Status: AC | PRN
  Filled 2023-04-17: qty 25, 8d supply, fill #0

## 2023-04-17 MED ORDER — SACUBITRIL-VALSARTAN 24-26 MG PO TABS
1.0000 | ORAL_TABLET | Freq: Two times a day (BID) | ORAL | 3 refills | Status: DC
  Filled 2023-04-17: qty 60, 30d supply, fill #0

## 2023-04-17 MED ORDER — ATORVASTATIN CALCIUM 80 MG PO TABS
80.0000 mg | ORAL_TABLET | Freq: Every day | ORAL | 3 refills | Status: DC
  Filled 2023-04-17: qty 30, 30d supply, fill #0

## 2023-04-17 MED ORDER — METOPROLOL SUCCINATE ER 25 MG PO TB24
25.0000 mg | ORAL_TABLET | Freq: Every day | ORAL | 3 refills | Status: DC
  Filled 2023-04-17: qty 30, 30d supply, fill #0

## 2023-04-17 MED FILL — Nitroglycerin IV Soln 100 MCG/ML in D5W: INTRA_ARTERIAL | Qty: 10 | Status: AC

## 2023-04-17 NOTE — Discharge Summary (Signed)
Advanced Heart Failure Team  Discharge Summary   Patient ID: Jacob Gonzales MRN: [id number], DOB/AGE: 65-13-1957 65 y.o. Admit date: 04/11/2023 D/C date:     04/17/2023   Primary Discharge Diagnoses:  Acute on chronic systolic CHF/ICM: 2. Anterior STEMI/CAD 3. Hypertension 4. DM II 5. Hyperlipidemia  Hospital Course:   Jacob Gonzales is a 67 y.o. male with history of CAD and prior MI in 2022, HFmrEF, DM II, HTN, and  CVA.   Admitted with 04/11/23 anterior STEMI and acute on chronic systolic CHF. Echo this admit EF 25-30%. In setting of anterior STEMI. Severe LM, LAD (treated with PTCA) and ramus disease (small), moderate LCx.  CMRI with non viable anterior myocardium. Multidisciplinary team discussed options and  he underwent PCI of LM and LCX . Plan for DAPT x1 year with asa + brillinta.   From HF/GMT perspective, he was placed on entresto, farxiga, and toprol xl. Cleda Daub was not added due to elevated potassium. Plan to continue lasix.   Life Vest placed prior to discharge. Plan to continue until repeat Echo.   See below for detailed problems list. He will contine to be followed closely in the HF clinic.    1. Acute on chronic systolic CHF/ICM: -EF 40-45% following MI in 2022 -Echo this admit EF 25-30%. In setting of anterior STEMI. Severe LM, LAD (treated with PTCA) and ramus disease (small), moderate LCx.  -RHC (04/13/23) w/ RA 7, PCWP 9, CI 2.7.  - Multidisciplinary discussion; CMRI w/ nonviable anterior myocardium, LVEF 33%, RVEF 48% - s/p PCI of LM and LCX 05/10. LVEDP 25 mmHg - Volume status stable. Continue lasix 40 mg daily.  - GDMT as noted below. - Continue Entresto 24/26 mg BID - Continue Farxiga 10 mg daily - Continue metoprolol xl 25 mg daily - Consider spironolactone as his follow up.  - Life Vest arranged for d/c . Plan to repeat ECHO after HF meds optimized. If EF remains < 35% will need EP for ICD.    2. Anterior STEMI/CAD -Hx NSTEMI in 2022 s/p PCI/DES to OM2 - Anterior  STEMI. High grade d LM, ostial LAD (treated with PTCA), ostial D1 and ostial ramus disease. Moderate LCx. -Decided against CABG as above. S/p IVUS guided PCI d LM into LCX using cutting balloon and coronary lithotripsy, Megatron stent placed. - DAPT X 1 year, on aspirin and brilinta - Continue metoprolol xl - High-intensity statin   3. Hypertension - Well controlled - Meds as above   4. DM II -A1c 8.0% -Added Farxiga -On trulicity at home. F/U with PCP.   5. Hyperlipidemia -LDL 176, Lipoprotein a 18.4 -Started Atorvastatin 80 -May benefit from PCSK-9 inhibitor as outpatient    Discharge Vitals: Blood pressure 93/61, pulse 84, temperature 98.2 F (36.8 C), temperature source Oral, resp. rate 18, weight 120.5 kg, SpO2 97 %.  Labs: Lab Results  Component Value Date   WBC 13.6 (H) 04/17/2023   HGB 15.2 04/17/2023   HCT 44.5 04/17/2023   MCV 88.5 04/17/2023   PLT 314 04/17/2023    Recent Labs  Lab 04/11/23 1650 04/11/23 1653 04/17/23 0051  NA 135   < > 133*  K 3.4*   < > 4.6  CL 101   < > 99  CO2 21*   < > 23  BUN 10   < > 19  CREATININE 1.01   < > 1.04  CALCIUM 8.5*   < > 8.8*  PROT 7.4  --   --   BILITOT  1.0  --   --   ALKPHOS 77  --   --   ALT 19  --   --   AST 24  --   --   GLUCOSE 227*   < > 164*   < > = values in this interval not displayed.   Lab Results  Component Value Date   CHOL 227 (H) 04/11/2023   HDL 29 (L) 04/11/2023   LDLCALC 176 (H) 04/11/2023   TRIG 111 04/11/2023   BNP (last 3 results) No results for input(s): "BNP" in the last 8760 hours.  ProBNP (last 3 results) No results for input(s): "PROBNP" in the last 8760 hours.   Diagnostic Studies/Procedures   LHC 04/14/23    Dist LM lesion is 90% stenosed.   Ost Cx lesion is 65% stenosed.   Ost LAD lesion is 95% stenosed.   Dist LAD lesion is 30% stenosed.   Ramus lesion is 80% stenosed.   1st Diag lesion is 80% stenosed.   2nd Mrg lesion is 20% stenosed.   A stent was successfully  placed.   A stent was successfully placed.   Post intervention, there is a 0% residual stenosis.   Post intervention, there is a 0% residual stenosis.  1.  IVUS guided PCI of distal left main into left circumflex using Cutting Balloon and coronary lithotripsy.  Given the angulation between the left main and left circumflex, a 4.5 x 20 mm Megatron stent was used (due to this platform's increased radial strength).  The minimal stent area was greater than 7 mm. 2.  LVEDP of 25 mmHg.  Recommendation: The results were reviewed with Dr. Gasper Lloyd.  Dual antiplatelet therapy for at least 1 year and goal-directed medical therapy for ischemic cardiomyopathy will be pursued.  RHC 04/13/2023   RA = 7 RV = 31/7 PA = 30/11 (19) PCW = 9 Fick cardiac output/index = 6.5/2.7 Thermo CO/CI = 7.1/3.0 PVR = 1.5 WU Ao sat = 93% PA sat = 67%, 68% PAPi = 2.7   CMRI 04/13/2023  Overall, in the LAD distribution, areas of scar appear >50% of the myocardial thickness, suggesting nonviable myocardium in the LAD territory, however degree of scar may be overestimated in the setting of acute MI. Viability assessment is typically more accurate >7 days post acute myocardial infarction. Areas of subendocardial no-reflow hypoenhancement noted in LAD distribution, poor prognostic indicator.  2. Normal left ventricular chamber size with moderately reduced function and septal and anterior regional wall motion abnormalities as noted in the findings. LVEF 33%.  3. Normal right ventricular chamber size with mildly reduced RV systolic function, RVEF 48%.  4.  No significant valvular heart disease  5.  Small pericardial effusion.   Echo 04/12/2023   1. Left ventricular ejection fraction, by estimation, is 25 to 30%. The  left ventricle has severely decreased function. The left ventricle  demonstrates regional wall motion abnormalities with akinesis of the mid  to apical anterior, anteroseptal, and  inferoseptal walls as  well as the true apex. No LV thrombus visualized.  Consistent with LAD territory infarction. The left ventricular internal  cavity size was mildly dilated. There is mild concentric left ventricular  hypertrophy. Left ventricular  diastolic parameters are consistent with Grade I diastolic dysfunction  (impaired relaxation).   2. Peak RV-RA gradient 21 mmHg. IVC not visualized. Right ventricular  systolic function is normal. The right ventricular size is normal.   3. The mitral valve is normal in structure. No evidence of  mitral valve  regurgitation. No evidence of mitral stenosis.  Discharge Medications   Allergies as of 04/17/2023   No Known Allergies      Medication List     STOP taking these medications    amLODipine 10 MG tablet Commonly known as: NORVASC   carvedilol 6.25 MG tablet Commonly known as: COREG   ezetimibe 10 MG tablet Commonly known as: ZETIA   hydrochlorothiazide 12.5 MG tablet Commonly known as: HYDRODIURIL   hydrochlorothiazide 25 MG tablet Commonly known as: HYDRODIURIL   HYDROcodone-acetaminophen 5-325 MG tablet Commonly known as: Norco   lisinopril 40 MG tablet Commonly known as: ZESTRIL       TAKE these medications    Aspirin Low Dose 81 MG chewable tablet Generic drug: aspirin Chew 1 tablet (81 mg total) by mouth daily.   atorvastatin 80 MG tablet Commonly known as: LIPITOR Take 1 tablet (80 mg total) by mouth daily.   Brilinta 90 MG Tabs tablet Generic drug: ticagrelor Take 1 tablet (90 mg total) by mouth 2 (two) times daily.   dapagliflozin propanediol 10 MG Tabs tablet Commonly known as: FARXIGA Take 1 tablet (10 mg total) by mouth daily. What changed:  medication strength how much to take   Entresto 24-26 MG Generic drug: sacubitril-valsartan Take 1 tablet by mouth 2 (two) times daily.   furosemide 40 MG tablet Commonly known as: LASIX Take 1 tablet (40 mg total) by mouth daily.   metoprolol succinate 25 MG 24 hr  tablet Commonly known as: TOPROL-XL Take 1 tablet (25 mg total) by mouth daily. Start taking on: Apr 18, 2023   nitroGLYCERIN 0.4 MG SL tablet Commonly known as: NITROSTAT Place 1 tablet (0.4 mg total) under the tongue every 5 (five) minutes as needed for chest pain or shortness of breath.   tamsulosin 0.4 MG Caps capsule Commonly known as: FLOMAX Take 0.4 mg by mouth daily after supper.   Trulicity 1.5 MG/0.5ML Sopn Generic drug: Dulaglutide Inject 1 mg into the skin daily.               Durable Medical Equipment  (From admission, onward)           Start     Ordered   04/17/23 0923  For home use only DME Vest life vest  Once       Comments: [employer] Life Vest   Identify indication. Type yes or no  Cardiac Arrest due to VF or sustained VT: No Familial or inherited condition with SCA risk:No MI with an EF < 35% or DCM with an EF < 35%: yes ICD Explanation:  Other condition with high risk of VT/VF: No  Life Vest Setting  VT 150 bpm VF 200 BPM 150Jx5 Length of need: 3 months  Start Date: 04/17/23   04/17/23 5784            Disposition   The patient will be discharged in stable condition to home. Discharge Instructions     (HEART FAILURE PATIENTS) Call MD:  Anytime you have any of the following symptoms: 1) 3 pound weight gain in 24 hours or 5 pounds in 1 week 2) shortness of breath, with or without a dry hacking cough 3) swelling in the hands, feet or stomach 4) if you have to sleep on extra pillows at night in order to breathe.   Complete by: As directed    Amb Referral to Cardiac Rehabilitation   Complete by: As directed    Diagnosis:  STEMI PTCA  Coronary Stents     After initial evaluation and assessments completed: Virtual Based Care may be provided alone or in conjunction with Phase 2 Cardiac Rehab based on patient barriers.: Yes   Intensive Cardiac Rehabilitation (ICR) [facility] location only OR Traditional Cardiac Rehabilitation (TCR) *If criteria for  ICR are not met will enroll in TCR [facility] only): Yes   Diet - low sodium heart healthy   Complete by: As directed    Heart Failure patients record your daily weight using the same scale at the same time of day   Complete by: As directed    Increase activity slowly   Complete by: As directed        Follow-up Information     Hamrick, Maura L, MD Follow up.   Specialty: Family Medicine Why: office will call to arrange hospital follow up appt Contact information: [address] [place] [address] [phone]         [facility] Follow up on 04/26/2023.   Specialty: Cardiology Why: Advanced Heart Failure Clinic 9:30 AM  Entrance C Free Valet Parking Contact information: [address] [id number] mc [place] [address] [phone]                  Duration of Discharge Encounter: Greater than 35 minutes   Signed, Tonye Becket  NP-C  04/17/2023, 1:22 PM

## 2023-04-17 NOTE — TOC Progression Note (Addendum)
Transition of Care [facility]) - Progression Note    Patient Details  Name: Jacob Gonzales MRN: [id number] Date of Birth: Jul 16, 1956  Transition of [facility]) CM/SW Contact  Elliot Cousin, RN Phone Number: [phone] 04/17/2023, 10:03 AM  Clinical Narrative:   CM received referral for LifeVest. Faxed order for LifeVest to [employer]. Contacted [employer] rep, Rayfield Citizen to make aware. Plan for dc home today.   Spoke to pt at bedside. States his son, Melchior will provide transportation at home. States he has scale for daily weight. Pt reports he has list of different food banks in [place]. Educated the food bank can assist with food insecurities. He only received $62 in food stamps. States his dtr and son son live in the home and assist with food. Reports he drives to his appts. Will give update to Unit RN when pt has been approved and time has been set up to fit for Life Vest.   4:00 pm Contacted pt's son, Damont and states he will be there for education of Life Vest. Explained [employer] rep will be here at 5 pm to fit and provide education. Updated Unit RN. Provided pt with brochure for [facility] for his appt on 5/24/204 at 930 am. Son states he prefers a local PCP. Contacted his PCP, Maura Hamrick. They did not have pt in syste.      Expected Discharge Plan: Home/Self Care Barriers to Discharge: No Barriers Identified  Expected Discharge Plan and Services   Discharge Planning Services: CM Consult   Living arrangements for the past 2 months: Single Family Home                                       Social Determinants of Health (SDOH) Interventions SDOH Screenings   Food Insecurity: No Food Insecurity (04/13/2023)  Transportation Needs: No Transportation Needs (04/13/2023)  Utilities: Not At Risk (04/14/2023)  Tobacco Use: Low Risk  (04/17/2023)    Readmission Risk Interventions     No data to display

## 2023-04-17 NOTE — Progress Notes (Signed)
CARDIAC REHAB PHASE I   PRE:  Rate/Rhythm: 85 SR    BP: sitting 99/87 right arm    SpO2: 99 RA  MODE:  Ambulation: 470 ft   POST:  Rate/Rhythm: 110 ST    BP: sitting 113/74 right arm     SpO2: 100 RA  Pt able to ambulate without c/o. To recliner. Reviewed ed including Brilinta importance, restrictions, ambulation, NTG. Also discussed HF management with booklet. He has a scale. Sts his son helps with cooking.  BP taken while in recliner, in the 80s on left arm. RN aware.  [id number]  Ethelda Chick BS, ACSM-CEP 04/17/2023 11:30 AM

## 2023-04-17 NOTE — Progress Notes (Addendum)
Advanced Heart Failure Rounding Note  PCP-Cardiologist: Lance Muss, MD   Subjective:    Feeling well. No dyspnea or CP. Ready to go home.   05/10: IVUS guided PCI of distal left main into LCX with cutting balloon, lithotripsy, and stent placement   Objective:   Weight Range: 120.5 kg Body mass index is 38.12 kg/m.   Vital Signs:   Temp:  [97.7 F (36.5 C)-98.7 F (37.1 C)] 98.2 F (36.8 C) (05/13 0729) Pulse Rate:  [83-93] 84 (05/13 0729) Resp:  [18-20] 18 (05/13 0729) BP: (99-116)/(56-79) 116/79 (05/13 0729) SpO2:  [93 %-99 %] 97 % (05/13 0729) Weight:  [120.5 kg] 120.5 kg (05/13 0432) Last BM Date :  (PTA)  Weight change: Filed Weights   04/14/23 0013 04/16/23 0651 04/17/23 0432  Weight: 122.6 kg 121.3 kg 120.5 kg    Intake/Output:   Intake/Output Summary (Last 24 hours) at 04/17/2023 0850 Last data filed at 04/17/2023 8469 Gross per 24 hour  Intake 476 ml  Output 2550 ml  Net -2074 ml      Physical Exam    General:  Well appearing.Lying flat in bed. HEENT: normal Neck: supple. JVD difficult to assess d/t neck size. Carotids 2+ bilat; no bruits.  Cor: PMI nondisplaced. Regular rate & rhythm. No rubs, gallops or murmurs. Lungs: clear Abdomen: soft, nontender, nondistended.  Extremities: no cyanosis, clubbing, rash, edema Neuro: alert & orientedx3. Affect pleasant    Telemetry   SR 80s  Labs    CBC Recent Labs    04/16/23 0205 04/17/23 0051  WBC 12.6* 13.6*  HGB 14.6 15.2  HCT 43.1 44.5  MCV 89.4 88.5  PLT 251 314   Basic Metabolic Panel Recent Labs    62/95/28 0205 04/17/23 0051  NA 132* 133*  K 4.2 4.6  CL 98 99  CO2 22 23  GLUCOSE 149* 164*  BUN 16 19  CREATININE 0.99 1.04  CALCIUM 8.6* 8.8*   Liver Function Tests No results for input(s): "AST", "ALT", "ALKPHOS", "BILITOT", "PROT", "ALBUMIN" in the last 72 hours. No results for input(s): "LIPASE", "AMYLASE" in the last 72 hours. Cardiac Enzymes No results for  input(s): "CKTOTAL", "CKMB", "CKMBINDEX", "TROPONINI" in the last 72 hours.  BNP: BNP (last 3 results) No results for input(s): "BNP" in the last 8760 hours.  ProBNP (last 3 results) No results for input(s): "PROBNP" in the last 8760 hours.   D-Dimer No results for input(s): "DDIMER" in the last 72 hours. Hemoglobin A1C No results for input(s): "HGBA1C" in the last 72 hours. Fasting Lipid Panel No results for input(s): "CHOL", "HDL", "LDLCALC", "TRIG", "CHOLHDL", "LDLDIRECT" in the last 72 hours. Thyroid Function Tests No results for input(s): "TSH", "T4TOTAL", "T3FREE", "THYROIDAB" in the last 72 hours.  Invalid input(s): "FREET3"  Other results:  RHC: 04/13/23: RA = 7 RV = 31/7 PA = 30/11 (19) PCW = 9 Fick cardiac output/index = 6.5/2.7 Thermo CO/CI = 7.1/3.0 PVR = 1.5 WU Ao sat = 93% PA sat = 67%, 68% PAPi = 2.7  Imaging    No results found.   Medications:     Scheduled Medications:  aspirin  81 mg Oral Daily   atorvastatin  80 mg Oral Daily   Chlorhexidine Gluconate Cloth  6 each Topical Daily   dapagliflozin propanediol  10 mg Oral Daily   folic acid  1 mg Oral Daily   furosemide  40 mg Oral Daily   insulin aspart  0-15 Units Subcutaneous TID WC  insulin glargine-yfgn  10 Units Subcutaneous QHS   metoprolol tartrate  12.5 mg Oral BID   multivitamin with minerals  1 tablet Oral Daily   potassium chloride  40 mEq Oral BID   sacubitril-valsartan  1 tablet Oral BID   sodium chloride flush  3 mL Intravenous Q12H   sodium chloride flush  3 mL Intravenous Q12H   thiamine  100 mg Oral Daily   ticagrelor  90 mg Oral BID    Infusions:  sodium chloride     sodium chloride      PRN Medications: sodium chloride, sodium chloride, acetaminophen, acetaminophen, LORazepam, ondansetron (ZOFRAN) IV, ondansetron (ZOFRAN) IV, sodium chloride flush, sodium chloride flush    Patient Profile   67 y.o. male with history of CAD and prior MI in 2022, HFmrEF, DM II,  HTN, hx CVA. Admitted with anterior STEMI and acute on chronic systolic CHF.   Assessment/Plan   Acute on chronic systolic CHF/ICM: -EF 40-45% following MI in 2022 -Echo this admit EF 25-30%. In setting of anterior STEMI. Severe LM, LAD (treated with PTCA) and ramus disease (small), moderate LCx.  -RHC (04/13/23) w/ RA 7, PCWP 9, CI 2.7.  - Multidisciplinary discussion; CMR w/ nonviable anterior myocardium, LVEF 33%, RVEF 48% - s/p PCI of LM and LCX 05/10. LVEDP 25 mmHg - Volume okay on exam. Continue lasix 40 mg daily.  - Continue Entresto 24/26 mg BID - Continue Farxiga 10 mg daily - Switch lopressor to metoprolol xl 25 mg daily - K 5.0. Stop K supplement. Hopefully can add spiro next. - Arrange for LifeVest   2. Anterior STEMI/CAD -Hx NSTEMI in 2022 s/p PCI/DES to OM2 -Now presenting with anterior STEMI. High grade d LM, ostial LAD (treated with PTCA), ostial D1 and ostial ramus disease. Moderate LCx. -Decided against CABG as above. S/p IVUS guided PCI d LM into LCX using cutting balloon and coronary lithotripsy, Megatron stent placed. - DAPT X 1 year, on aspirin and brilinta - Continue metoprolol xl - High-intensity statin   3. Hypertension - Well controlled - Meds as above   4. DM II -A1c 8.0% -Added Farxiga -On trulicity at home. F/u with PCP.   5. Hyperlipidemia -LDL 176, Lipoprotein a 18.4 -Started Atorvastatin 80 -May benefit from PCSK-9 inhibitor as outpatient  Needs to mobilize.  Likely stable for discharge if he can get LifeVest by end of day.  Length of Stay: 6  FINCH, LINDSAY N, PA-C  04/17/2023, 8:50 AM  Advanced Heart Failure Team Pager [phone] (M-F; 7a - 5p)  Please contact [facility] for night-coverage after hours (5p -7a ) and weekends on [email or web address]   Patient seen with PA, agree with the above note.   He is doing well today, no complaints.  No chest pain or dyspnea.   General: NAD Neck: No JVD, no thyromegaly or thyroid nodule.  Lungs:  Clear to auscultation bilaterally with normal respiratory effort. CV: Nondisplaced PMI.  Heart regular S1/S2, no S3/S4, no murmur.  No peripheral edema.   Abdomen: Soft, nontender, no hepatosplenomegaly, no distention.  Skin: Intact without lesions or rashes.  Neurologic: Alert and oriented x 3.  Psych: Normal affect. Extremities: No clubbing or cyanosis.  HEENT: Normal.   I am going to continue the current HF and CAD meds.  He appears well-compensated.  We will arrange for Lifevest placement then he can go home later today.   Marca Ancona 04/17/2023. 9:37 AM

## 2023-04-17 NOTE — TOC Transition Note (Signed)
Discharge medications (1) are being stored in the Transitions of Care (TOC) Pharmacy on the second floor until patient is ready for discharge.    

## 2023-04-19 ENCOUNTER — Telehealth (HOSPITAL_COMMUNITY): Payer: Self-pay | Admitting: Licensed Clinical Social Worker

## 2023-04-19 MED ORDER — DIGITAL GLASS SCALE MISC: 0 refills | Status: AC

## 2023-04-19 NOTE — Telephone Encounter (Signed)
H&V Care Navigation CSW Progress Note  Clinical Social Worker informed that pt dtr called in to request some assistance with getting a scale.  CSW called dtr who is mainly frustrated about medical information being shared with her brother rather than herself.  She is authorized representative for the pt for disability and CSW had to explain that this did not extend to healthcare- informed they would need DPR for [place] to release information to her or direct verbal permission from the patient every time we needed to share information- pt dtr distracted and did not acknowledge this information.  Dtr then asked about getting access to mychart.  CSW informed I would need to talk to pt and get permission to send text to the dtrs phone with a link to sign up for My chart- pt got on phone and provided permission- CSW sent activation text to dtrs phone.  Dtr unable to set up account and became very frustrated with CSW assistance and hung up.  Prior to termination of the call pt had confirmed he needed help with obtaining a scale and CSW had clinic staff send prescription to [facility] for scale- they confirm this is the pharmacy they use anyway so will be able to pick this up easily.  Patient is participating in a Managed Medicaid Plan:  Yes  SDOH Screenings   Food Insecurity: Food Insecurity Present (04/17/2023)  Housing: Low Risk  (04/17/2023)  Transportation Needs: No Transportation Needs (04/13/2023)  Utilities: Not At Risk (04/14/2023)  Tobacco Use: Low Risk  (04/17/2023)    Burna Sis, LCSW Clinical Social Worker Advanced Heart Failure Clinic Desk#: [phone] Cell#: [phone]

## 2023-04-19 NOTE — Telephone Encounter (Signed)
Dme/order sent to pts pharmacy for scale

## 2023-04-25 ENCOUNTER — Telehealth (HOSPITAL_COMMUNITY): Payer: Self-pay

## 2023-04-25 NOTE — Progress Notes (Signed)
ADVANCED HF CLINIC CONSULT NOTE  PCP: Dr. Burnell Blanks Primary Cardiologist: Lance Muss, MD HF Cardiologist: Dr. Gasper Lloyd  HPI: 67 y.o.. 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    Admitted 5/24 with anterior STEMI. LHC: High grade d LM, ostial LAD (treated with balloon angioplasty), ostial D1 and ostial ramus disease. Given severity of disease, surgical revascularization recommended. He was seen by TCTS, with plans for CABG after optimization. LVEDP 31 on cath, diuresed with IV lasix. Echo showed EF 25-30%, WMA consistent with LAD territory infarct, RV okay. AHF consulted for volume optimization. CMRI showed w/ nonviable anterior myocardium, LVEF 33%, RVEF 48%. RHC showed stable hemodynamics with preserved CO. Ultimately decided against CABG and underwent IVUS guided PCI d LM into LCX using cutting balloon and coronary lithotripsy, Megatron stent placed. GDMT titrated and he was discharged home, weight 265 lbs.  Today he returns for post hospital HF follow up. Overall feeling fine. Denies increasing SOB, CP, dizziness, edema, or PND/Orthopnea. Appetite ok. No fever or chills. Weight at home 170 pounds. Taking all medications. He owns 2 farms in the area. No tobacco use, consumes ETOH on occasion.  Cardiac Studies - RHC (5/24): RA 7, PA 30/11 (19), PCW 9, CO/CI (Fick) 6.5/2.7, PVR 1.5 WU, PAPi 2.7  - cMRI (5/24): LVEF 33%, RVEF 48%, nonviable anterior myocardium  - Echo (5/24): EF 25-30%, WMA consistent with LAD territory infarct, RV ok   - LHC (5/24): High grade d LM, ostial LAD (treated with balloon angioplasty), ostial D1 and ostial ramus disease    Review of Systems: [y] = yes, [ ]  = no   General: Weight gain [ ] ; Weight loss [ ] ; Anorexia [ ] ; Fatigue [ ] ; Fever [ ] ; Chills [ ] ; Weakness [ ]   Cardiac: Chest pain/pressure [ ] ; Resting SOB [ ] ; Exertional SOB [ ] ; Orthopnea [ ] ; Pedal  Edema [ ] ; Palpitations [ ] ; Syncope [ ] ; Presyncope [ ] ; Paroxysmal nocturnal dyspnea[ ]   Pulmonary: Cough [ ] ; Wheezing[ ] ; Hemoptysis[ ] ; Sputum [ ] ; Snoring [ ]   GI: Vomiting[ ] ; Dysphagia[ ] ; Melena[ ] ; Hematochezia [ ] ; Heartburn[ ] ; Abdominal pain [ ] ; Constipation [ ] ; Diarrhea [ ] ; BRBPR [ ]   GU: Hematuria[ ] ; Dysuria [ ] ; Nocturia[ ]   Vascular: Pain in legs with walking [ ] ; Pain in feet with lying flat [ ] ; Non-healing sores [ ] ; Stroke Cove.Etienne ]; TIA [ ] ; Slurred speech [ ] ;  Neuro: Headaches[ ] ; Vertigo[ ] ; Seizures[ ] ; Paresthesias[ ] ;Blurred vision [ ] ; Diplopia [ ] ; Vision changes [ ]   Ortho/Skin: Arthritis [ ] ; Joint pain [ ] ; Muscle pain [ ] ; Joint swelling [ ] ; Back Pain [ ] ; Rash [ ]   Psych: Depression[ ] ; Anxiety[ ]   Heme: Bleeding problems [ ] ; Clotting disorders [ ] ; Anemia [ ]   Endocrine: Diabetes Cove.Etienne ]; Thyroid dysfunction[ ]    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   Arthritis    Bilateral shoulders, Hands, Knees   Bone cancer (HCC) 2016   Brain cancer (HCC) 2016   Diabetes (HCC) 11/17/2015   HTN (hypertension) 11/17/2015   NSTEMI (non-ST elevated myocardial infarction) [facility])     Current Outpatient Medications  Medication Sig Dispense Refill   aspirin 81 MG chewable tablet Chew 1 tablet (81 mg total) by mouth daily. 30 tablet 3   atorvastatin (LIPITOR)  80 MG tablet Take 1 tablet (80 mg total) by mouth daily. 30 tablet 3   dapagliflozin propanediol (FARXIGA) 10 MG TABS tablet Take 1 tablet (10 mg total) by mouth daily. 30 tablet 3   ezetimibe (ZETIA) 10 MG tablet Take 10 mg by mouth daily.     furosemide (LASIX) 40 MG tablet Take 1 tablet (40 mg total) by mouth daily. 30 tablet 3   metoprolol succinate (TOPROL-XL) 25 MG 24 hr tablet Take 1 tablet (25 mg total) by mouth daily. 30 tablet 3   Misc. Devices (DIGITAL GLASS SCALE) MISC UAD to monitor weight daily Dx: I50.9 1 each 0    nitroGLYCERIN (NITROSTAT) 0.4 MG SL tablet Place 1 tablet (0.4 mg total) under the tongue every 5 (five) minutes as needed for chest pain or shortness of breath. 25 tablet 0   sacubitril-valsartan (ENTRESTO) 24-26 MG Take 1 tablet by mouth 2 (two) times daily. 60 tablet 3   tamsulosin (FLOMAX) 0.4 MG CAPS capsule Take 0.4 mg by mouth daily after supper.     ticagrelor (BRILINTA) 90 MG TABS tablet Take 1 tablet (90 mg total) by mouth 2 (two) times daily. 60 tablet 11   TRULICITY 1.5 MG/0.5ML SOPN Inject 1 mg into the skin daily.     No current facility-administered medications for this visit.    No Known Allergies    Social History   Socioeconomic History   Marital status: Married    Spouse name: Not on file   Number of children: 3   Years of education: Not on file   Highest education level: Not on file  Occupational History   Not on file  Tobacco Use   Smoking status: Never   Smokeless tobacco: Never  Vaping Use   Vaping Use: Never used  Substance and Sexual Activity   Alcohol use: Yes   Drug use: No   Sexual activity: Not on file  Other Topics Concern   Not on file  Social History Narrative   Lives at home with wife   Caffeine use: Drinks soda (2-12packs per week)   Social Determinants of Health   Financial Resource Strain: Not on file  Food Insecurity: Food Insecurity Present (04/17/2023)   Hunger Vital Sign    Worried About Running Out of Food in the Last Year: Often true    Ran Out of Food in the Last Year: Often true  Transportation Needs: No Transportation Needs (04/13/2023)   PRAPARE - Administrator, Civil Service (Medical): No    Lack of Transportation (Non-Medical): No  Physical Activity: Not on file  Stress: Not on file  Social Connections: Not on file  Intimate Partner Violence: Not At Risk (04/13/2023)   Humiliation, Afraid, Rape, and Kick questionnaire    Fear of Current or Ex-Partner: No    Emotionally Abused: No    Physically Abused: No     Sexually Abused: No   Family History  Problem Relation Age of Onset   Heart attack Mother    Heart attack Father    Diabetes Mellitus II Father    Kidney failure Father    Uterine cancer Sister    Heart attack Brother    Heart attack Maternal Aunt    Heart attack Maternal Uncle    Heart attack Paternal Aunt    Heart attack Paternal Uncle    Stroke Neg Hx     There were no vitals filed for this visit.  PHYSICAL EXAM: General:  NAD. No  resp difficulty HEENT: Normal Neck: Supple. No JVD. Carotids 2+ bilat; no bruits. No lymphadenopathy or thryomegaly appreciated. Cor: PMI nondisplaced. Regular rate & rhythm. No rubs, gallops or murmurs. Lungs: Clear Abdomen: Soft, nontender, nondistended. No hepatosplenomegaly. No bruits or masses. Good bowel sounds. Extremities: No cyanosis, clubbing, rash, edema Neuro: Alert & oriented x 3, cranial nerves grossly intact. Moves all 4 extremities w/o difficulty. Affect pleasant.  ECG (personally reviewed):  ASSESSMENT & PLAN: Chronic systolic CHF/ICM: - Echo (following MI in 2022): EF 40-45% - Echo(5/24): EF 25-30%. In setting of anterior STEMI. Severe LM, LAD (treated with PTCA) and ramus disease (small), moderate LCx.  - RHC (5/24): RA 7, PCWP 9, CI 2.7.  - CMR (5/24): w/ nonviable anterior myocardium, LVEF 33%, RVEF 48% - s/p PCI of LM and LCX 05/10. LVEDP 25 mmHg - NYHA II, volume stable - Start spiro 12.5 mg daily. - Continue lasix 40 mg daily.  - Continue Entresto 24/26 mg bid - Continue Farxiga 10 mg daily - Continue Toprol XL 25 mg daily - Continue LifeVest - Labs today, BMET in 1 week.   2. CAD - Hx NSTEMI (2022) s/p PCI/DES to OM2 - Anterior STEMI (5/24) High grade d LM, ostial LAD (treated with PTCA), ostial D1 and ostial ramus disease. Moderate LCx. - Decided against CABG as above. S/p IVUS guided PCI d LM into LCX using cutting balloon and coronary lithotripsy, Megatron stent placed. - No chest pain - Continue DAPT X 1  year, w/ ASA + Brilinta - Continue beta blocker + statin. - Encourage CR   3. HTN - BP well controlled - Meds as above   4. DM II - A1c 8.0% - Continue Farxiga - On Trulicity.   5. Hyperlipidemia - LDL 176, Lp (a) 18.4 - Continue atorva 80 - May benefit from PCSK-9 inhibitor   Follow up in 3 months with Dr. Gasper Lloyd + echo.  Prince Rome, FNP-BC 04/25/23

## 2023-04-25 NOTE — Telephone Encounter (Signed)
Pt is not interested in the cardiac rehab. Closed referral. 

## 2023-04-26 ENCOUNTER — Ambulatory Visit (HOSPITAL_COMMUNITY)
Admit: 2023-04-26 | Discharge: 2023-04-26 | Disposition: A | Discharge status: Home / Self Care | Payer: Medicaid Other | Attending: Family Medicine | Admitting: Family Medicine

## 2023-04-26 ENCOUNTER — Encounter (HOSPITAL_COMMUNITY): Payer: Self-pay

## 2023-04-26 VITALS — BP 102/82 | HR 88 | Wt 259.6 lb

## 2023-04-26 DIAGNOSIS — I110 Hypertensive heart disease with heart failure: Secondary | ICD-10-CM | POA: Diagnosis not present

## 2023-04-26 DIAGNOSIS — I10 Essential (primary) hypertension: Secondary | ICD-10-CM | POA: Diagnosis not present

## 2023-04-26 DIAGNOSIS — Z955 Presence of coronary angioplasty implant and graft: Secondary | ICD-10-CM | POA: Insufficient documentation

## 2023-04-26 DIAGNOSIS — Z8673 Personal history of transient ischemic attack (TIA), and cerebral infarction without residual deficits: Secondary | ICD-10-CM | POA: Insufficient documentation

## 2023-04-26 DIAGNOSIS — E785 Hyperlipidemia, unspecified: Secondary | ICD-10-CM | POA: Insufficient documentation

## 2023-04-26 DIAGNOSIS — Z7902 Long term (current) use of antithrombotics/antiplatelets: Secondary | ICD-10-CM | POA: Diagnosis not present

## 2023-04-26 DIAGNOSIS — Z7984 Long term (current) use of oral hypoglycemic drugs: Secondary | ICD-10-CM | POA: Diagnosis not present

## 2023-04-26 DIAGNOSIS — E119 Type 2 diabetes mellitus without complications: Secondary | ICD-10-CM | POA: Diagnosis not present

## 2023-04-26 DIAGNOSIS — I5022 Chronic systolic (congestive) heart failure: Secondary | ICD-10-CM | POA: Diagnosis not present

## 2023-04-26 DIAGNOSIS — Z79899 Other long term (current) drug therapy: Secondary | ICD-10-CM | POA: Insufficient documentation

## 2023-04-26 DIAGNOSIS — Z7985 Long-term (current) use of injectable non-insulin antidiabetic drugs: Secondary | ICD-10-CM | POA: Insufficient documentation

## 2023-04-26 DIAGNOSIS — Z833 Family history of diabetes mellitus: Secondary | ICD-10-CM | POA: Diagnosis not present

## 2023-04-26 DIAGNOSIS — Z7982 Long term (current) use of aspirin: Secondary | ICD-10-CM | POA: Diagnosis not present

## 2023-04-26 DIAGNOSIS — Z823 Family history of stroke: Secondary | ICD-10-CM | POA: Insufficient documentation

## 2023-04-26 DIAGNOSIS — I252 Old myocardial infarction: Secondary | ICD-10-CM | POA: Diagnosis not present

## 2023-04-26 DIAGNOSIS — I2510 Atherosclerotic heart disease of native coronary artery without angina pectoris: Secondary | ICD-10-CM | POA: Diagnosis not present

## 2023-04-26 DIAGNOSIS — Z794 Long term (current) use of insulin: Secondary | ICD-10-CM

## 2023-04-26 DIAGNOSIS — Z8249 Family history of ischemic heart disease and other diseases of the circulatory system: Secondary | ICD-10-CM | POA: Diagnosis not present

## 2023-04-26 LAB — BASIC METABOLIC PANEL
Anion gap: 12 (ref 5–15)
BUN: 32 mg/dL — ABNORMAL HIGH (ref 8–23)
CO2: 22 mmol/L (ref 22–32)
Calcium: 9.1 mg/dL (ref 8.9–10.3)
Chloride: 99 mmol/L (ref 98–111)
Creatinine, Ser: 1.31 mg/dL — ABNORMAL HIGH (ref 0.61–1.24)
GFR, Estimated: >60 mL/min (ref >60)
Glucose, Bld: 169 mg/dL — ABNORMAL HIGH (ref 70–99)
Potassium: 4.6 mmol/L (ref 3.5–5.1)
Sodium: 133 mmol/L — ABNORMAL LOW (ref 135–145)

## 2023-04-26 LAB — BRAIN NATRIURETIC PEPTIDE: B Natriuretic Peptide: 128.3 pg/mL — ABNORMAL HIGH (ref 0.0–100.0)

## 2023-04-26 MED ORDER — FUROSEMIDE 40 MG PO TABS: 40.0000 mg | ORAL_TABLET | ORAL | 3 refills | Status: DC | PRN

## 2023-04-26 MED ORDER — SPIRONOLACTONE 25 MG PO TABS: 12.5000 mg | ORAL_TABLET | Freq: Every day | ORAL | 3 refills | Status: DC

## 2023-04-26 NOTE — Patient Instructions (Signed)
START Spironolactone 12.5 mg (one half) tab nightly at bedtime CHANGE Lasix to 40 mg as needed for weight gain or swelling  Labs today We will only contact you if something comes back abnormal or we need to make some changes. Otherwise no news is good news!  Labs needed in 7 days  You have been referred to back to Cardiac Rehab with [facility] -they will be in touch to arrange orientation   Your physician recommends that you schedule a follow-up appointment in: 3-4 weeks with the pharmacy team and in 3 months with Dr Gasper Lloyd and echo  Your physician has requested that you have an echocardiogram. Echocardiography is a painless test that uses sound waves to create images of your heart. It provides your doctor with information about the size and shape of your heart and how well your heart's chambers and valves are working. This procedure takes approximately one hour. There are no restrictions for this procedure. Please do NOT wear cologne, perfume, aftershave, or lotions (deodorant is allowed). Please arrive 15 minutes prior to your appointment time.   Do the following things EVERYDAY: Weigh yourself in the morning before breakfast. Write it down and keep it in a log. Take your medicines as prescribed Eat low salt foods--Limit salt (sodium) to 2000 mg per day.  Stay as active as you can everyday Limit all fluids for the day to less than 2 liters  .At the [facility], you and your health needs are our priority. As part of our continuing mission to provide you with exceptional heart care, we have created designated Provider Care Teams. These Care Teams include your primary Cardiologist (physician) and Advanced Practice Providers (APPs- Physician Assistants and Nurse Practitioners) who all work together to provide you with the care you need, when you need it.   You may see any of the following providers on your designated Care Team at your next follow up: Dr Arvilla Meres Dr Marca Ancona Dr. Marcos Eke, NP Robbie Lis, [place] [facility] [place], [place] Brynda Peon, NP Karle Plumber, PharmD   Please be sure to bring in all your medications bottles to every appointment.    Thank you for choosing [facility]

## 2023-04-28 ENCOUNTER — Inpatient Hospital Stay (INDEPENDENT_AMBULATORY_CARE_PROVIDER_SITE_OTHER): Payer: Self-pay | Admitting: Primary Care

## 2023-05-03 ENCOUNTER — Ambulatory Visit (HOSPITAL_COMMUNITY)
Admission: RE | Admit: 2023-05-03 | Discharge: 2023-05-03 | Disposition: A | Discharge status: Home / Self Care | Payer: Medicaid Other | Source: Ambulatory Visit | Attending: Internal Medicine | Admitting: Internal Medicine

## 2023-05-03 DIAGNOSIS — I5022 Chronic systolic (congestive) heart failure: Secondary | ICD-10-CM | POA: Diagnosis present

## 2023-05-03 LAB — BASIC METABOLIC PANEL
Anion gap: 15 (ref 5–15)
BUN: 35 mg/dL — ABNORMAL HIGH (ref 8–23)
CO2: 21 mmol/L — ABNORMAL LOW (ref 22–32)
Calcium: 9.5 mg/dL (ref 8.9–10.3)
Chloride: 99 mmol/L (ref 98–111)
Creatinine, Ser: 1.29 mg/dL — ABNORMAL HIGH (ref 0.61–1.24)
GFR, Estimated: >60 mL/min (ref >60)
Glucose, Bld: 181 mg/dL — ABNORMAL HIGH (ref 70–99)
Potassium: 4 mmol/L (ref 3.5–5.1)
Sodium: 135 mmol/L (ref 135–145)

## 2023-05-05 ENCOUNTER — Telehealth (HOSPITAL_COMMUNITY): Payer: Self-pay

## 2023-05-05 NOTE — Telephone Encounter (Signed)
Called and spoke with pt in regards to CR, pt stated he is still interested. Adv pt we are still waiting on Medicaid form. Will refax form today.

## 2023-05-08 ENCOUNTER — Encounter: Payer: Self-pay | Admitting: Internal Medicine

## 2023-05-08 ENCOUNTER — Ambulatory Visit: Payer: Self-pay

## 2023-05-08 NOTE — Telephone Encounter (Signed)
Reason for Disposition  Taking prescription medication that could cause nausea (e.g., narcotics/opiates, antibiotics, OCPs, many others)    Encouraged his daughter Jacob Gonzales to call the pharmacist where he gets his medications for compatibility issues and nausea medication.  Answer Assessment - Initial Assessment Questions 1. NAUSEA SEVERITY: "How bad is the nausea?" (e.g., mild, moderate, severe; dehydration, weight loss)   - MILD: loss of appetite without change in eating habits   - MODERATE: decreased oral intake without significant weight loss, dehydration, or malnutrition   - SEVERE: inadequate caloric or fluid intake, significant weight loss, symptoms of dehydration     Jacob Gonzales daughter calling in.  His medications are making him nauseas.   He is on 8 medications.    He was in the hospital 3 weeks ago and was started on all these medications.   They are making him nauseas.   He has heart, high BP and diabetes medicines and a diabetes shot he is on.   I was wondering if there was a medicine for nausea he could take that is compatible with all his medications.   He does not have a PCP per daughter.   These are medications from the hospital. I let her know she really needed to talk with a pharmacist for compatibility with all the medications he is on.   She needed to go meet her daughter to pick them up from school so she had to go but said she would call the pharmacist when she got back.   2. ONSET: "When did the nausea begin?"     He gets nauseas after his medications. 3. VOMITING: "Any vomiting?" If Yes, ask: "How many times today?"     Didn't get this far as daughter had to leave to pick up child from school. 4. RECURRENT SYMPTOM: "Have you had nausea before?" If Yes, ask: "When was the last time?" "What happened that time?"     Not asked 5. CAUSE: "What do you think is causing the nausea?"     The medications he was given while in the hospital 3 weeks ago. 6. PREGNANCY: "Is there any chance  you are pregnant?" (e.g., unprotected intercourse, missed birth control pill, broken condom)     N/A  Protocols used: Nausea-A-AH  Chief Complaint: Daughter, Jacob Gonzales called in that her father is having nausea after taking his medications.   Wanting to know what nausea medication he can take.   He was given 8 medications to take when he came home from the hospital.   (04/11/2023 admitted with M.I.)   Does not have a PCP.    Jacob Gonzales had to go pick up someone from school so unable to do a full triage.    Does not have a PCP.    I let Jacob Gonzales know it would be best if she called his pharmacist as far as compatibility since he is on so many medications.   The pharmacist would be able to assist her with nausea medication and compatibility.   Jacob Gonzales was agreeable to this plan and had to go.   She stated,   "I'll call the pharmacist when I get back home".    Symptoms: nausea from medications Frequency: after taking his medications Pertinent Negatives: Patient denies N/A Disposition: [] ED /[] Urgent Care (no appt availability in office) / [] Appointment(In office/virtual)/ []  [facility]/ [] Home Care/ [] Refused Recommended Disposition /[] [facility]/ [x]  Follow-up with PCP Additional Notes: Follow up with his pharmacist.   Jacob Gonzales was agreeable  to calling the pharmacist when she returns from picking someone up from school.   Complete triage unable to be done.    She as agreeable to calling his pharmacist when she returns home. He has an hospital f/u appt. With Gwinda Passe, NP for 05/19/2023 but is not an established pt with [facility].

## 2023-05-08 NOTE — Telephone Encounter (Signed)
Summary: Advice follow Heart Attack with nausea and fatigue   Daughter is calling to report that patient was hospitalized for a heart attack after discharge is extremely fatigued and nauseous after taking the medications  Hospital follow up rescheduled for 05/19/23. Please advise      Called pt - phone rang for a very long time. Message stated, " call cannot be completed at this time".

## 2023-05-15 NOTE — Progress Notes (Signed)
Advanced Heart Failure Clinic Note   PCP: Dr. Burnell Blanks Primary Cardiologist: Lance Muss, MD HF Cardiologist: Dr. Gasper Lloyd  HPI:  67 y.o. male with history of CAD (NSTEMI 2022 s/p PCI/DES to OM2), HFmrEF (EF 40-45% on echo in 01/2021), HTN, CVA, DM II, pulmonary nodules.    Admitted 04/2023 with anterior STEMI. LHC: High grade d LM, ostial LAD (treated with balloon angioplasty), ostial D1 and ostial ramus disease. Given severity of disease, surgical revascularization recommended. He was seen by TCTS, with plans for CABG after optimization. LVEDP 31 on cath, diuresed with IV Lasix. Echo showed EF 25-30%, WMA consistent with LAD territory infarct, RV okay. AHF consulted for volume optimization. CMRI showed w/ nonviable anterior myocardium, LVEF 33%, RVEF 48%. RHC showed stable hemodynamics with preserved CO. Ultimately decided against CABG and underwent IVUS guided PCI d LM into LCX using cutting balloon and coronary lithotripsy, Megatron stent placed. GDMT titrated and he was discharged home, weight 265 lbs.   Presented to [facility] for post hospital HF follow up with his son 04/26/23. Overall was feeling fine. He had SOB walking up steps but otherwise no issues. 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 Denied palpitations, CP, dizziness, edema, or PND/Orthopnea. Appetite was ok. No fever or chills. Weight at home was 265 pounds. Reported taking all medications. He owns 2 farms in the area. No tobacco use, consumes ETOH on occasion/drugs. Considering CR. LifeVest rolling up and does not fit well.  Today he returns to HF clinic for pharmacist medication titration. At last visit with APP, spironolactone 12.5 mg daily was initiated and Lasix was changed to PRN only. Overall he is feeling well today. Came to clinic with his son. No dizziness, lightheadedness, CP or palpitations. No SOB/DOE, but states activity is limited due to knee pain. Weight is stable at 250-252 lbs at home.  No LEE, PND or orthopnea. Thinks he is still taking Lasix daily (his daughter manages all his medications). No LEE, PND or orthopnea. Wearing LifeVest. Taking medications with food has helped with nausea.     HF Medications: Metoprolol succinate 25 mg daily Entresto 24/26 mg BID Spironolactone 12.5 mg daily Farxiga 10 mg daily Lasix 40 mg PRN  Has the patient been experiencing any side effects to the medications prescribed?  no  Does the patient have any problems obtaining medications due to transportation or finances?   No [facility] Medicaid. Fills at [employer].   Understanding of regimen: fair Understanding of indications: fair Potential of compliance: good Patient understands to avoid NSAIDs. Patient understands to avoid decongestants.    Pertinent Lab Values: 05/03/23: Serum creatinine 1.29, BUN 35, Potassium 4.0, Sodium 135  Vital Signs: Weight: 253.4 lbs (last clinic weight: 259.6 lbs) Blood pressure: 106/70  Heart rate: 80   Assessment/Plan: Chronic systolic CHF/ICM: - Echo (following MI in 2022): EF 40-45% - Echo (04/2023): EF 25-30%. In setting of anterior STEMI. Severe LM, LAD (treated with PTCA) and ramus disease (small), moderate LCx.  - RHC (04/2023): RA 7, PCWP 9, CI 2.7.  - CMR (04/2023): w/ nonviable anterior myocardium, LVEF 33%, RVEF 48% - s/p PCI of LM and LCX 04/14/23. LVEDP 25 mmHg - NYHA II, euvolemic on exam.  - Decrease Lasix to 40 mg PRN only - Continue metoprolol XL 25 mg daily. Anticipate increasing at next visit if remains stable.  - Continue Entresto 24/26 mg BID - Increase spironolactone to 25 mg daily - Continue Farxiga 10 mg daily. No  GU symptoms. - Repeat echo after GDMT titrated. Continue LifeVest.   2. CAD - Hx NSTEMI (2022) s/p PCI/DES to OM2 - Anterior STEMI (04/2023) High grade d LM, ostial LAD (treated with PTCA), ostial D1 and ostial ramus disease. Moderate LCx. - Decided against CABG as above. S/p IVUS guided PCI d LM into  LCX using cutting balloon and coronary lithotripsy, Megatron stent placed. - No chest pain. - Continue DAPT X 1 year, w/ ASA + Brilinta - Continue beta blocker + statin. - He is agreeable to trying CR, will re-refer.   3. HTN - BP well controlled. - Meds as above   4. DM II - A1c 8.0% - Continue Farxiga. - On Trulicity.   5. Hyperlipidemia - LDL 176, Lp (a) 18.4 - Continue atorvastatin 80 mg daily - Repeat LFTs/lipids 6 weeks. - May benefit from PCSK-9 inhibitor if LDL not at goal.     Karle Plumber, PharmD, BCPS, BCCP, CPP Heart Failure Clinic Pharmacist [phone]

## 2023-05-16 ENCOUNTER — Other Ambulatory Visit (HOSPITAL_COMMUNITY): Payer: Self-pay

## 2023-05-18 ENCOUNTER — Telehealth (INDEPENDENT_AMBULATORY_CARE_PROVIDER_SITE_OTHER): Payer: Self-pay | Admitting: Primary Care

## 2023-05-18 NOTE — Telephone Encounter (Signed)
Spoke with pt and they confirmed their apt.

## 2023-05-19 ENCOUNTER — Ambulatory Visit (INDEPENDENT_AMBULATORY_CARE_PROVIDER_SITE_OTHER): Payer: Medicaid Other | Admitting: Primary Care

## 2023-05-19 ENCOUNTER — Encounter (INDEPENDENT_AMBULATORY_CARE_PROVIDER_SITE_OTHER): Payer: Self-pay | Admitting: Primary Care

## 2023-05-19 VITALS — BP 103/71 | HR 76 | Resp 16 | Ht 69.0 in | Wt 252.8 lb

## 2023-05-19 DIAGNOSIS — Z1159 Encounter for screening for other viral diseases: Secondary | ICD-10-CM | POA: Diagnosis not present

## 2023-05-19 DIAGNOSIS — I5022 Chronic systolic (congestive) heart failure: Secondary | ICD-10-CM

## 2023-05-19 DIAGNOSIS — Z7689 Persons encountering health services in other specified circumstances: Secondary | ICD-10-CM

## 2023-05-19 DIAGNOSIS — Z1211 Encounter for screening for malignant neoplasm of colon: Secondary | ICD-10-CM | POA: Diagnosis not present

## 2023-05-19 DIAGNOSIS — E119 Type 2 diabetes mellitus without complications: Secondary | ICD-10-CM | POA: Diagnosis not present

## 2023-05-19 LAB — GLUCOSE, POCT (MANUAL RESULT ENTRY): POC Glucose: 153 mg/dl — AB (ref 70–99)

## 2023-05-19 NOTE — Patient Instructions (Signed)

## 2023-05-19 NOTE — Progress Notes (Signed)
[facility]   Subjective:   Jacob Gonzales is a 67 y.o. male presents for hospital follow up and establish care. Admit date to the hospital was 04/11/23, patient was discharged from the hospital on 04/17/23, patient was admitted for: Chronic systolic heart failure.  Patient has already followed up with cardiology.  Voices no complaints or concerns at this time blood pressure is unremarkable, lower extremity edema.  He voices no complaints or concerns at this time. Patient has No headache, No chest pain, No abdominal pain - No Nausea, No new weakness tingling or numbness, No Cough - shortness of breath   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   Arthritis    Bilateral shoulders, Hands, Knees   Bone cancer (HCC) 2016   Brain cancer (HCC) 2016   Diabetes (HCC) 11/17/2015   HTN (hypertension) 11/17/2015   NSTEMI (non-ST elevated myocardial infarction) (HCC)      No Known Allergies    Current Outpatient Medications on File Prior to Visit  Medication Sig Dispense Refill   aspirin 81 MG chewable tablet Chew 1 tablet (81 mg total) by mouth daily. 30 tablet 3   atorvastatin (LIPITOR) 80 MG tablet Take 1 tablet (80 mg total) by mouth daily. 30 tablet 3   dapagliflozin propanediol (FARXIGA) 10 MG TABS tablet Take 1 tablet (10 mg total) by mouth daily. 30 tablet 3   ezetimibe (ZETIA) 10 MG tablet Take 10 mg by mouth daily.     furosemide (LASIX) 40 MG tablet Take 1 tablet (40 mg total) by mouth as needed. 30 tablet 3   metoprolol succinate (TOPROL-XL) 25 MG 24 hr tablet Take 1 tablet (25 mg total) by mouth daily. 30 tablet 3   Misc. Devices (DIGITAL GLASS SCALE) MISC UAD to monitor weight daily Dx: I50.9 1 each 0   nitroGLYCERIN (NITROSTAT) 0.4 MG SL tablet Place 1 tablet (0.4 mg total) under the tongue every 5 (five) minutes as needed for chest pain or shortness of breath. 25 tablet 0   sacubitril-valsartan  (ENTRESTO) 24-26 MG Take 1 tablet by mouth 2 (two) times daily. 60 tablet 3   spironolactone (ALDACTONE) 25 MG tablet Take 0.5 tablets (12.5 mg total) by mouth at bedtime. 45 tablet 3   tamsulosin (FLOMAX) 0.4 MG CAPS capsule Take 0.4 mg by mouth daily after supper.     ticagrelor (BRILINTA) 90 MG TABS tablet Take 1 tablet (90 mg total) by mouth 2 (two) times daily. 60 tablet 11   TRULICITY 1.5 MG/0.5ML SOPN Inject 1 mg into the skin once a week. Tuesday     No current facility-administered medications on file prior to visit.     Review of System: Comprehensive ROS Pertinent positive and negative noted in HPI    Objective:  Blood Pressure 103/71   Pulse 76   Respiration 16   Height 5\' 9"  (1.753 m)   Weight 252 lb 12.8 oz (114.7 kg)   Oxygen Saturation 98%   Body Mass Index 37.33 kg/m   Filed Weights   05/19/23 0916  Weight: 252 lb 12.8 oz (114.7 kg)    Physical Exam:   General Appearance: Well nourished, in no apparent distress. Eyes: PERRLA, EOMs, conjunctiva no swelling or erythema Sinuses: No Frontal/maxillary tenderness ENT/Mouth: Ext aud canals clear, TMs without erythema, bulging. Hearing normal.  Neck: Supple, thyroid normal.  Respiratory: Respiratory effort normal, BS equal bilaterally without rales, rhonchi, wheezing or stridor.  Cardio: RRR with  no MRGs. Brisk peripheral pulses without edema.  Abdomen: Soft, + BS.  Non tender, no guarding, rebound, hernias, masses. Lymphatics: Non tender without lymphadenopathy.  Musculoskeletal: Full ROM, 5/5 strength, normal gait.  Skin: Warm, dry without rashes, lesions, ecchymosis.  Neuro: Cranial nerves intact. Normal muscle tone, no cerebellar symptoms. Sensation intact.  Psych: Awake and oriented X 3, normal affect, Insight and Judgment appropriate.    Assessment:  Jacob Gonzales was seen today for hospitalization follow-up.  Diagnoses and all orders for this visit: Encounter to establish care 2/2 hospital discharge Heart  failure clinic completed hospital discharge encounter to establish care Colon cancer screening -     Cologuard  Encounter for HCV screening test for low risk patient -     HCV Ab w Reflex to Quant PCR  Type 2 diabetes mellitus without complication, without long-term current use of insulin (HCC) -     POCT glucose (manual entry) -     Microalbumin / creatinine urine ratio  Chronic systolic heart failure (HCC) Managed by heart failure clinic    This note has been created with Education officer, environmental. Any transcriptional errors are unintentional.   Grayce Sessions, NP 05/19/2023, 9:34 AM

## 2023-05-24 ENCOUNTER — Ambulatory Visit (HOSPITAL_COMMUNITY)
Admission: RE | Admit: 2023-05-24 | Discharge: 2023-05-24 | Disposition: A | Discharge status: Home / Self Care | Payer: Medicaid Other | Source: Ambulatory Visit | Attending: Internal Medicine | Admitting: Internal Medicine

## 2023-05-24 VITALS — BP 106/70 | HR 80 | Wt 253.4 lb

## 2023-05-24 DIAGNOSIS — E119 Type 2 diabetes mellitus without complications: Secondary | ICD-10-CM | POA: Insufficient documentation

## 2023-05-24 DIAGNOSIS — E785 Hyperlipidemia, unspecified: Secondary | ICD-10-CM | POA: Diagnosis not present

## 2023-05-24 DIAGNOSIS — Z9861 Coronary angioplasty status: Secondary | ICD-10-CM | POA: Insufficient documentation

## 2023-05-24 DIAGNOSIS — I2510 Atherosclerotic heart disease of native coronary artery without angina pectoris: Secondary | ICD-10-CM | POA: Diagnosis not present

## 2023-05-24 DIAGNOSIS — I110 Hypertensive heart disease with heart failure: Secondary | ICD-10-CM | POA: Insufficient documentation

## 2023-05-24 DIAGNOSIS — I5022 Chronic systolic (congestive) heart failure: Secondary | ICD-10-CM | POA: Insufficient documentation

## 2023-05-24 MED ORDER — SPIRONOLACTONE 25 MG PO TABS: 25.0000 mg | ORAL_TABLET | Freq: Every day | ORAL | 3 refills | Status: DC

## 2023-05-24 NOTE — Patient Instructions (Signed)
It was a pleasure seeing you today!  MEDICATIONS: -We are changing your medications today -Increase spironolactone to 25 mg (1 tablet) daily -Decrease Lasix (furosemide) to taking only as needed for extra fluid. You may take it if you have a weight gain of more than 3 lbs overnight or 5 lbs in one week or if there is swelling in your legs/ankles.  -Call if you have questions about your medications.   NEXT APPOINTMENT: Return to clinic in 1 month with Dr. Gasper Lloyd.  In general, to take care of your heart failure: -Limit your fluid intake to 2 Liters (half-gallon) per day.   -Limit your salt intake to ideally 2-3 grams (2000-3000 mg) per day. -Weigh yourself daily and record, and bring that "weight diary" to your next appointment.  (Weight gain of 2-3 pounds in 1 day typically means fluid weight.) -The medications for your heart are to help your heart and help you live longer.   -Please contact us before stopping any of your heart medications.  Call the clinic at [phone] with questions or to reschedule future appointments.

## 2023-05-31 ENCOUNTER — Encounter (HOSPITAL_COMMUNITY): Payer: Self-pay

## 2023-05-31 ENCOUNTER — Telehealth (HOSPITAL_COMMUNITY): Payer: Self-pay

## 2023-05-31 NOTE — Telephone Encounter (Signed)
Attempted to call patient in regards to Cardiac Rehab - LM on VM   Sent letter 

## 2023-07-07 ENCOUNTER — Telehealth (HOSPITAL_COMMUNITY): Payer: Self-pay

## 2023-07-07 NOTE — Telephone Encounter (Signed)
Pt is no longer interested in the cardiac rehab program. Closed referral.

## 2023-07-20 ENCOUNTER — Telehealth (INDEPENDENT_AMBULATORY_CARE_PROVIDER_SITE_OTHER): Payer: Self-pay | Admitting: Primary Care

## 2023-07-20 NOTE — Telephone Encounter (Signed)
Pt was unavailable. Could not leave VM. Called because I wanted to infor pt about their apt.

## 2023-07-21 ENCOUNTER — Ambulatory Visit (INDEPENDENT_AMBULATORY_CARE_PROVIDER_SITE_OTHER): Payer: Medicaid Other | Admitting: Primary Care

## 2023-07-21 ENCOUNTER — Encounter (INDEPENDENT_AMBULATORY_CARE_PROVIDER_SITE_OTHER): Payer: Self-pay | Admitting: Primary Care

## 2023-07-21 VITALS — BP 100/66 | HR 72 | Resp 16 | Wt 247.6 lb

## 2023-07-21 DIAGNOSIS — E119 Type 2 diabetes mellitus without complications: Secondary | ICD-10-CM | POA: Diagnosis not present

## 2023-07-21 LAB — POCT GLYCOSYLATED HEMOGLOBIN (HGB A1C): HbA1c, POC (controlled diabetic range): 6.5 % (ref 0.0–7.0)

## 2023-07-21 NOTE — Patient Instructions (Signed)
Hypoglycemia Hypoglycemia is when the amount of sugar, or glucose, in your blood is too low. Low blood sugar can happen if you have diabetes or if you don't have diabetes. It may be an emergency. What are the causes? Low blood sugar happens most often in people who have diabetes. It may be caused by: Diabetes medicine. Not eating enough, or not eating often enough. Being more active than normal. If you don't have diabetes, you may still get low blood sugar if: There's a tumor in your pancreas. A tumor is a growth of cells that isn't normal. You don't eat enough, or you fast. Fasting is when you don't eat for long periods at a time. You have a bad infection or illness. You have problems after weight loss surgery. You have kidney or liver problems. You take certain medicines. What increases the risk? You're more likely to have low blood sugar if: You have diabetes and take medicine for it. You drink a lot of alcohol. You get sick. What are the signs or symptoms? Mild Hunger or feeling like you may vomit. Sweating and feeling cold to the touch. Feeling dizzy or light-headed. Being sleepy or having trouble sleeping. A headache. Blurry vision. Mood changes. These include feeling worried, nervous, or easily annoyed. Moderate Feeling confused. Changes in the way you act. Weakness. An uneven heartbeat. Very bad Having very low blood sugar is an emergency. It can cause: Fainting. Seizures. A coma. Death. How is this diagnosed?  Low blood sugar can be found with a blood test. This test tells you how much sugar is in your blood. It's done while you're having symptoms. Your health care provider may also do an exam and look at your medical history. How is this treated? Treating low blood sugar If you have low blood sugar, eat or drink something with sugar in it right away. The food or drink should have 15 grams of a fast-acting carbohydrate (carb). Options include: 4 oz (120 mL) of  fruit juice. 4 oz (120 mL) of soda (not diet soda). A few pieces of hard candy. Check food labels to see how many pieces to eat. 1 Tbsp (15 mL) of sugar or honey. 4 glucose tablets. 1 tube of glucose gel. Treating low blood sugar if you have diabetes Talk with your provider about how much carb you should take. If you're alert and can swallow safely, you may follow the 15:15 rule: Take 15 grams of a fast-acting carb. Check your blood sugar 15 minutes after you take the carb. If your blood sugar is still at or below 70 mg/dL (3.9 mmol/L), take 15 grams of a carb again. If your blood sugar doesn't go above 70 mg/dL (3.9 mmol/L) after 3 tries, get help right away. After your blood sugar goes back to normal, eat a meal or a snack within 1 hour. Always keep 15 grams of a fast-acting carb with you. This could be: 4 glucose tablets. A few pieces of hard candy. 1 Tbsp (15 mL) of honey or sugar. 1 tube of glucose gel. Treating very low blood sugar If your blood sugar is less than 54 mg/dL (3 mmol/L), it's an emergency. Get help right away. If you can't eat or drink, you will need to be given glucagon. A family member or friend should learn how to check your blood sugar and give you glucagon. Ask your provider if you should keep a glucagon kit at home. You may also need to be treated in a hospital. Follow  these instructions at home: If you have diabetes: Always keep a fast-acting carb (15 grams) with you. Follow your diabetes care plan. Make sure you: Know the symptoms of low blood sugar. Check your blood sugar as often as told. Always check it before and after you exercise. Always check your blood sugar before you drive. Take your medicines as told. Eat on time. Do not skip meals. Share your diabetes care plan with: Your work or school. The people you live with. Wear an alert bracelet or carry a card that says you have diabetes. General instructions If you drink alcohol: Limit how much you  have to: 0-1 drink a day if you're male. 0-2 drinks a day if you're male. Know how much alcohol is in your drink. In the [place] one drink is one 12 oz bottle of beer (355 mL), one 5 oz glass of wine (148 mL), or one 1 oz glass of hard liquor (44 mL). Be sure to eat food when you drink alcohol. Be sure to check your blood sugar after you drink. Alcohol may lead to low blood sugar later. Where to find more information American Diabetes Association (ADA): diabetes.org Contact a health care provider if: You have low blood sugar often. You have diabetes and are having trouble keeping your blood sugar in the right range. Get help right away if: You can't get your blood sugar above 70 mg/dL (3.9 mmol/L) after 3 tries. Your blood sugar is below 54 mg/dL (3 mmol/L). You have a seizure. You faint. These symptoms may be an emergency. Call 911 right away. Do not wait to see if the symptoms will go away. Do not drive yourself to the hospital. This information is not intended to replace advice given to you by your health care provider. Make sure you discuss any questions you have with your health care provider. Document Revised: 02/08/2023 Document Reviewed: 02/08/2023 Elsevier Patient Education  2024 [employer].

## 2023-07-21 NOTE — Progress Notes (Unsigned)
[facility]  Jacob Gonzales, is a 67 y.o. male  AVW:[id number]  [id number]:[id number]  DOB - 06/05/1956  No chief complaint on file.      Subjective:   Jacob Gonzales is a 67 y.o. male here today for a follow up visit T2D- Denies polyuria, polydipsia, polyphasia or vision changes.  Does not check blood sugars at home. He is managed by cardiology and Bp is unremarkably . 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 Patient has No headache, No chest pain, No abdominal pain - No Nausea, No new weakness tingling or numbness, No Cough - shortness of breath  No problems updated.  No Known Allergies  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   Arthritis    Bilateral shoulders, Hands, Knees   Bone cancer (HCC) 2016   Brain cancer (HCC) 2016   Diabetes (HCC) 11/17/2015   HTN (hypertension) 11/17/2015   NSTEMI (non-ST elevated myocardial infarction) [facility])     Current Outpatient Medications on File Prior to Visit  Medication Sig Dispense Refill   aspirin 81 MG chewable tablet Chew 1 tablet (81 mg total) by mouth daily. 30 tablet 3   atorvastatin (LIPITOR) 80 MG tablet Take 1 tablet (80 mg total) by mouth daily. 30 tablet 3   dapagliflozin propanediol (FARXIGA) 10 MG TABS tablet Take 1 tablet (10 mg total) by mouth daily. 30 tablet 3   furosemide (LASIX) 40 MG tablet Take 1 tablet (40 mg total) by mouth as needed. 30 tablet 3   metoprolol succinate (TOPROL-XL) 25 MG 24 hr tablet Take 1 tablet (25 mg total) by mouth daily. 30 tablet 3   Misc. Devices (DIGITAL GLASS SCALE) MISC UAD to monitor weight daily Dx: I50.9 1 each 0   nitroGLYCERIN (NITROSTAT) 0.4 MG SL tablet Place 1 tablet (0.4 mg total) under the tongue every 5 (five) minutes as needed for chest pain or shortness of breath. 25 tablet 0   sacubitril-valsartan (ENTRESTO) 24-26 MG Take 1 tablet by mouth 2  (two) times daily. 60 tablet 3   spironolactone (ALDACTONE) 25 MG tablet Take 1 tablet (25 mg total) by mouth daily. 90 tablet 3   tamsulosin (FLOMAX) 0.4 MG CAPS capsule Take 0.4 mg by mouth daily after supper.     ticagrelor (BRILINTA) 90 MG TABS tablet Take 1 tablet (90 mg total) by mouth 2 (two) times daily. 60 tablet 11   TRULICITY 1.5 MG/0.5ML SOPN Inject 1 mg into the skin once a week. Tuesday     No current facility-administered medications on file prior to visit.    Objective:   Vitals:   07/21/23 0908  BP: 100/66  Pulse: 72  Resp: 16  SpO2: 99%  Weight: 247 lb 9.6 oz (112.3 kg)    Comprehensive ROS Pertinent positive and negative noted in HPI   Exam General appearance : Awake, alert, not in any distress. Speech Clear. Not toxic looking HEENT: Atraumatic and Normocephalic, pupils equally reactive to light and accomodation, right ear cerumen impaction Neck: Supple, no JVD. No cervical lymphadenopathy.  Chest: Good air entry bilaterally, no added sounds  CVS: S1 S2 regular, no murmurs.  Abdomen: Bowel sounds present, Non tender and not distended with no gaurding, rigidity or rebound. Extremities: B/L Lower Ext shows no edema, both legs are warm to touch Neurology: Awake alert, and oriented X 3,  Non focal Skin:  No Rash  Data Review Lab Results  Component Value Date   HGBA1C 6.5 07/21/2023   HGBA1C 8.0 (H) 04/11/2023   HGBA1C 6.8 (H) 01/15/2021    Assessment & Plan   Diagnoses and all orders for this visit:  Type 2 diabetes mellitus without complication, without long-term current use of insulin (HCC) -     POCT glycosylated hemoglobin (Hb A1C) 6.5 improved  - educated on lifestyle modifications, including but not limited to diet choices and adding exercise to daily routine.       Patient have been counseled extensively about nutrition and exercise. Other issues discussed during this visit include: low cholesterol diet, weight control and daily exercise, foot  care, annual eye examinations at Ophthalmology, importance of adherence with medications and regular follow-up. We also discussed long term complications of uncontrolled diabetes and hypertension.   Return in about 3 months (around 10/21/2023) for T2D .  The patient was given clear instructions to go to ER or return to medical center if symptoms don't improve, worsen or new problems develop. The patient verbalized understanding. The patient was told to call to get lab results if they haven't heard anything in the next week.   This note has been created with Education officer, environmental. Any transcriptional errors are unintentional.   Jacob Sessions, NP 07/24/2023, 5:14 PM

## 2023-07-27 ENCOUNTER — Telehealth (HOSPITAL_COMMUNITY): Payer: Self-pay | Admitting: *Deleted

## 2023-07-30 NOTE — Progress Notes (Signed)
ADVANCED HEART FAILURE CLINIC NOTE  Referring Physician: No ref. provider found  Primary Care: Grayce Sessions, NP Primary Cardiologist:  HPI: 67 y.o.. 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    Admitted 5/24 with anterior STEMI. LHC: High grade d LM, ostial LAD (treated with balloon angioplasty), ostial D1 and ostial ramus disease. Given severity of disease, surgical revascularization recommended. He was seen by TCTS, with plans for CABG after optimization. LVEDP 31 on cath, diuresed with IV lasix. Echo showed EF 25-30%, WMA consistent with LAD territory infarct, RV okay. AHF consulted for volume optimization. CMRI showed w/ nonviable anterior myocardium, LVEF 33%, RVEF 48%. RHC showed stable hemodynamics with preserved CO. Ultimately decided against CABG and underwent IVUS guided PCI d LM into LCX using cutting balloon and coronary lithotripsy, Megatron stent placed. GDMT titrated and he was discharged home, weight 265 lbs.  Interval hx:    Since that time he has been seen in APP/TOC clinic where medications were further uptitrated. From a functional standpoint ***      Activity level/exercise tolerance:  *** Orthopnea:  Sleeps on *** pillows Paroxysmal noctural dyspnea:  *** Chest pain/pressure:  *** Orthostatic lightheadedness:  *** Palpitations:  *** Lower extremity edema:  *** Presyncope/syncope:  *** Cough:  ***  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   Arthritis    Bilateral shoulders, Hands, Knees   Bone cancer (HCC) 2016   Brain cancer (HCC) 2016   Diabetes (HCC) 11/17/2015   HTN (hypertension) 11/17/2015   NSTEMI (non-ST elevated myocardial infarction) [facility])     Current Outpatient Medications  Medication Sig Dispense Refill   aspirin 81 MG chewable tablet Chew 1 tablet (81 mg total) by mouth  daily. 30 tablet 3   atorvastatin (LIPITOR) 80 MG tablet Take 1 tablet (80 mg total) by mouth daily. 30 tablet 3   dapagliflozin propanediol (FARXIGA) 10 MG TABS tablet Take 1 tablet (10 mg total) by mouth daily. 30 tablet 3   furosemide (LASIX) 40 MG tablet Take 1 tablet (40 mg total) by mouth as needed. 30 tablet 3   metoprolol succinate (TOPROL-XL) 25 MG 24 hr tablet Take 1 tablet (25 mg total) by mouth daily. 30 tablet 3   Misc. Devices (DIGITAL GLASS SCALE) MISC UAD to monitor weight daily Dx: I50.9 1 each 0   nitroGLYCERIN (NITROSTAT) 0.4 MG SL tablet Place 1 tablet (0.4 mg total) under the tongue every 5 (five) minutes as needed for chest pain or shortness of breath. 25 tablet 0   sacubitril-valsartan (ENTRESTO) 24-26 MG Take 1 tablet by mouth 2 (two) times daily. 60 tablet 3   spironolactone (ALDACTONE) 25 MG tablet Take 1 tablet (25 mg total) by mouth daily. 90 tablet 3   tamsulosin (FLOMAX) 0.4 MG CAPS capsule Take 0.4 mg by mouth daily after supper.     ticagrelor (BRILINTA) 90 MG TABS tablet Take 1 tablet (90 mg total) by mouth 2 (two) times daily. 60 tablet 11   TRULICITY 1.5 MG/0.5ML SOPN Inject 1 mg into the skin once a week. Tuesday     No current facility-administered medications for this visit.    No Known Allergies    Social History   Socioeconomic History   Marital status: Married    Spouse name: Not on file   Number of children: 3   Years  of education: Not on file   Highest education level: Not on file  Occupational History   Not on file  Tobacco Use   Smoking status: Never   Smokeless tobacco: Never  Vaping Use   Vaping status: Never Used  Substance and Sexual Activity   Alcohol use: Yes   Drug use: No   Sexual activity: Not on file  Other Topics Concern   Not on file  Social History Narrative   Lives at home with wife   Caffeine use: Drinks soda (2-12packs per week)   Social Determinants of Health   Financial Resource Strain: Not on file  Food  Insecurity: Food Insecurity Present (04/17/2023)   Hunger Vital Sign    Worried About Running Out of Food in the Last Year: Often true    Ran Out of Food in the Last Year: Often true  Transportation Needs: No Transportation Needs (04/13/2023)   PRAPARE - Administrator, Civil Service (Medical): No    Lack of Transportation (Non-Medical): No  Physical Activity: Not on file  Stress: Not on file  Social Connections: Not on file  Intimate Partner Violence: Not At Risk (04/13/2023)   Humiliation, Afraid, Rape, and Kick questionnaire    Fear of Current or Ex-Partner: No    Emotionally Abused: No    Physically Abused: No    Sexually Abused: No      Family History  Problem Relation Age of Onset   Heart attack Mother    Heart attack Father    Diabetes Mellitus II Father    Kidney failure Father    Uterine cancer Sister    Heart attack Brother    Heart attack Maternal Aunt    Heart attack Maternal Uncle    Heart attack Paternal Aunt    Heart attack Paternal Uncle    Stroke Neg Hx     PHYSICAL EXAM: There were no vitals filed for this visit. GENERAL: Well nourished, well developed, and in no apparent distress at rest.  HEENT: Negative for arcus senilis or xanthelasma. There is no scleral icterus.  The mucous membranes are pink and moist.   NECK: Supple, No masses. Normal carotid upstrokes without bruits. No masses or thyromegaly.    CHEST: There are no chest wall deformities. There is no chest wall tenderness. Respirations are unlabored.  Lungs- *** CARDIAC:  JVP: *** cm H2O         {HEART SOUNDS:22645}  Normal rate with regular rhythm. No murmurs, rubs or gallops.  Pulses are 2+ and symmetrical in upper and lower extremities. *** edema.  ABDOMEN: Soft, non-tender, non-distended. There are no masses or hepatomegaly. There are normal bowel sounds.  EXTREMITIES: Warm and well perfused with no cyanosis, clubbing.  LYMPHATIC: No axillary or supraclavicular lymphadenopathy.   NEUROLOGIC: Patient is oriented x3 with no focal or lateralizing neurologic deficits.  PSYCH: Patients affect is appropriate, there is no evidence of anxiety or depression.  SKIN: Warm and dry; no lesions or wounds.   DATA REVIEW  ECG: ***  As per my personal interpretation  ECHO: (5/24): EF 25-30%, WMA consistent with LAD territory infarct, RV ok   CATH: - RHC (5/24): RA 7, PA 30/11 (19), PCW 9, CO/CI (Fick) 6.5/2.7, PVR 1.5 WU, PAPi 2.7 - LHC (5/24): High grade d LM, ostial LAD (treated with balloon angioplasty), ostial D1 and ostial ramus disease  CMR:   - cMRI (5/24): LVEF 33%, RVEF 48%, nonviable anterior myocardium  ASSESSMENT & PLAN:  Heart  Failure with reduced EF Etiology of UJ:[id number] cardiomyopathy; LHC above; CMR with nonviable myocardium.  NYHA class / AHA Stage:*** Volume status & Diuretics: *** Vasodilators:*** Beta-Blocker:*** MRA:*** Cardiometabolic:*** Devices therapies & Valvulopathies:*** Advanced therapies:***  2. CAD - Hx NSTEMI (2022) s/p PCI/DES to OM2 - Anterior STEMI (5/24) High grade d LM, ostial LAD (treated with PTCA), ostial D1 and ostial ramus disease. Moderate LCx. - Decided against CABG as above. S/p IVUS guided PCI d LM into LCX using cutting balloon and coronary lithotripsy, Megatron stent placed. - No chest pain. - Continue DAPT X 1 year, w/ ASA + Brilinta - Continue beta blocker + statin. - He is agreeable to trying CR, will re-refer.   3. HTN - BP well controlled. - Meds as above   4. DM II - A1c 8.0% - Continue Farxiga. - On Trulicity.   5. Hyperlipidemia - LDL 176, Lp (a) 18.4 - Continue atorva 80 - Repeat LFTs/lipids 6 weeks. - May benefit from PCSK-9 inhibitor if LDL not at goal.  Dorthula Nettles Advanced Heart Failure Mechanical Circulatory Support

## 2023-07-31 ENCOUNTER — Telehealth (HOSPITAL_COMMUNITY): Payer: Self-pay

## 2023-07-31 ENCOUNTER — Ambulatory Visit (HOSPITAL_COMMUNITY)
Admission: RE | Admit: 2023-07-31 | Discharge: 2023-07-31 | Disposition: A | Discharge status: Home / Self Care | Payer: Medicaid Other | Source: Ambulatory Visit | Attending: Cardiology | Admitting: Cardiology

## 2023-07-31 ENCOUNTER — Encounter (HOSPITAL_COMMUNITY): Payer: Self-pay | Admitting: Cardiology

## 2023-07-31 ENCOUNTER — Ambulatory Visit (HOSPITAL_COMMUNITY): Admission: RE | Admit: 2023-07-31 | Payer: Medicaid Other | Source: Ambulatory Visit

## 2023-07-31 VITALS — BP 102/78 | HR 67 | Wt 248.8 lb

## 2023-07-31 DIAGNOSIS — I2510 Atherosclerotic heart disease of native coronary artery without angina pectoris: Secondary | ICD-10-CM

## 2023-07-31 DIAGNOSIS — I252 Old myocardial infarction: Secondary | ICD-10-CM | POA: Diagnosis present

## 2023-07-31 DIAGNOSIS — E119 Type 2 diabetes mellitus without complications: Secondary | ICD-10-CM

## 2023-07-31 DIAGNOSIS — I110 Hypertensive heart disease with heart failure: Secondary | ICD-10-CM | POA: Insufficient documentation

## 2023-07-31 DIAGNOSIS — E785 Hyperlipidemia, unspecified: Secondary | ICD-10-CM | POA: Diagnosis not present

## 2023-07-31 DIAGNOSIS — Z794 Long term (current) use of insulin: Secondary | ICD-10-CM

## 2023-07-31 DIAGNOSIS — I5032 Chronic diastolic (congestive) heart failure: Secondary | ICD-10-CM

## 2023-07-31 DIAGNOSIS — I10 Essential (primary) hypertension: Secondary | ICD-10-CM

## 2023-07-31 DIAGNOSIS — I5022 Chronic systolic (congestive) heart failure: Secondary | ICD-10-CM | POA: Insufficient documentation

## 2023-07-31 DIAGNOSIS — Z21 Asymptomatic human immunodeficiency virus [HIV] infection status: Secondary | ICD-10-CM | POA: Diagnosis not present

## 2023-07-31 DIAGNOSIS — J449 Chronic obstructive pulmonary disease, unspecified: Secondary | ICD-10-CM | POA: Diagnosis not present

## 2023-07-31 DIAGNOSIS — I255 Ischemic cardiomyopathy: Secondary | ICD-10-CM | POA: Insufficient documentation

## 2023-07-31 DIAGNOSIS — Z5941 Food insecurity: Secondary | ICD-10-CM | POA: Insufficient documentation

## 2023-07-31 LAB — BASIC METABOLIC PANEL
Anion gap: 13 (ref 5–15)
BUN: 22 mg/dL (ref 8–23)
CO2: 22 mmol/L (ref 22–32)
Calcium: 9 mg/dL (ref 8.9–10.3)
Chloride: 101 mmol/L (ref 98–111)
Creatinine, Ser: 1.04 mg/dL (ref 0.61–1.24)
GFR, Estimated: >60 mL/min (ref >60)
Glucose, Bld: 137 mg/dL — ABNORMAL HIGH (ref 70–99)
Potassium: 4.3 mmol/L (ref 3.5–5.1)
Sodium: 136 mmol/L (ref 135–145)

## 2023-07-31 LAB — ECHOCARDIOGRAM COMPLETE
AR max vel: 3.09 cm2
AV Area VTI: 2.78 cm2
AV Area mean vel: 2.98 cm2
AV Mean grad: 3 mmHg
AV Peak grad: 4.6 mmHg
Ao pk vel: 1.07 m/s
Area-P 1/2: 3 cm2
Calc EF: 55.9 %
S' Lateral: 4.9 cm
Single Plane A2C EF: 56.8 %
Single Plane A4C EF: 53.4 %

## 2023-07-31 LAB — BRAIN NATRIURETIC PEPTIDE: B Natriuretic Peptide: 69.2 pg/mL (ref 0.0–100.0)

## 2023-07-31 MED ORDER — PERFLUTREN LIPID MICROSPHERE
1.0000 mL | INTRAVENOUS | Status: DC | PRN
  Administered 2023-07-31: 6 mL via INTRAVENOUS
  Filled 2023-07-31: qty 10

## 2023-07-31 NOTE — Patient Instructions (Addendum)
Good to see you today!  You can stop wearing your Life vest company will call you to pick up  vest  You have been ordered Pulmonary Function test  Labs done today, your results will be available in MyChart, we will contact you for abnormal readings.   If you have any questions or concerns before your next appointment please send [place] a message through [place] or call our office at [phone].    TO LEAVE A MESSAGE FOR THE NURSE SELECT OPTION 2, PLEASE LEAVE A MESSAGE INCLUDING: YOUR NAME DATE OF BIRTH CALL BACK NUMBER REASON FOR CALL**this is important as we prioritize the call backs  YOU WILL RECEIVE A CALL BACK THE SAME DAY AS LONG AS YOU CALL BEFORE 4:00 PM  At the [facility], you and your health needs are our priority. As part of our continuing mission to provide you with exceptional heart care, we have created designated Provider Care Teams. These Care Teams include your primary Cardiologist (physician) and Advanced Practice Providers (APPs- Physician Assistants and Nurse Practitioners) who all work together to provide you with the care you need, when you need it.   You may see any of the following providers on your designated Care Team at your next follow up: Dr Arvilla Meres Dr Marca Ancona Dr. Marcos Eke, NP Robbie Lis, [place] [facility] [place], [place] Brynda Peon, NP Karle Plumber, PharmD   Please be sure to bring in all your medications bottles to every appointment.    Thank you for choosing [facility]

## 2023-07-31 NOTE — Telephone Encounter (Signed)
Called [employer] to discontinue patient's life vest. Spoke to [employer].She will reach out to patient

## 2023-08-08 ENCOUNTER — Other Ambulatory Visit (HOSPITAL_COMMUNITY): Payer: Self-pay | Admitting: Family Medicine

## 2023-08-08 ENCOUNTER — Other Ambulatory Visit (HOSPITAL_COMMUNITY): Payer: Self-pay | Admitting: Physician Assistant

## 2023-08-14 ENCOUNTER — Ambulatory Visit (INDEPENDENT_AMBULATORY_CARE_PROVIDER_SITE_OTHER): Payer: Medicaid Other

## 2023-08-21 ENCOUNTER — Ambulatory Visit (INDEPENDENT_AMBULATORY_CARE_PROVIDER_SITE_OTHER): Payer: Medicaid Other

## 2023-08-21 ENCOUNTER — Other Ambulatory Visit (INDEPENDENT_AMBULATORY_CARE_PROVIDER_SITE_OTHER): Payer: Self-pay | Admitting: Primary Care

## 2023-08-21 NOTE — Telephone Encounter (Signed)
Medication Refill - Medication: TRULICITY 1.5 MG/0.5ML SOPN /requests inhaler but  Pt is all out of this med  Has the patient contacted their pharmacy? yes (Agent: If yes, when and what did the pharmacy advise?)contact pcp  Preferred Pharmacy (with phone number or street name): [facility] - [place], [place] - [address]  Phone: [phone] Fax: [phone] Has the patient been seen for an appointment in the last year OR does the patient have an upcoming appointment? yes  Agent: Please be advised that RX refills may take up to 3 business days. We ask that you follow-up with your pharmacy.

## 2023-08-22 NOTE — Telephone Encounter (Signed)
Requested medication (s) are due for refill today: Yes  Requested medication (s) are on the active medication list: Yes  Last refill:  04/16/21  Future visit scheduled: No  Notes to clinic:  Unable to refill per protocol, last refill by another provider.      Requested Prescriptions  Pending Prescriptions Disp Refills   TRULICITY 1.5 MG/0.5ML SOPN      Sig: Inject 1 mg into the skin once a week. Tuesday     Endocrinology:  Diabetes - GLP-1 Receptor Agonists Passed - 08/21/2023  3:56 PM      Passed - HBA1C is between 0 and 7.9 and within 180 days    HbA1c, POC (controlled diabetic range)  Date Value Ref Range Status  07/21/2023 6.5 0.0 - 7.0 % Final         Passed - Valid encounter within last 6 months    Recent Outpatient Visits           1 month ago Type 2 diabetes mellitus without complication, without long-term current use of insulin (HCC)   [place] [facility] Grayce Sessions, NP   3 months ago Colon cancer screening   [place] [facility] Grayce Sessions, NP

## 2023-08-23 ENCOUNTER — Other Ambulatory Visit (INDEPENDENT_AMBULATORY_CARE_PROVIDER_SITE_OTHER): Payer: Self-pay | Admitting: Primary Care

## 2023-08-23 DIAGNOSIS — G8929 Other chronic pain: Secondary | ICD-10-CM

## 2023-08-25 NOTE — Telephone Encounter (Signed)
Will forward to provider  

## 2023-08-30 ENCOUNTER — Other Ambulatory Visit (INDEPENDENT_AMBULATORY_CARE_PROVIDER_SITE_OTHER): Payer: Medicaid Other

## 2023-08-30 ENCOUNTER — Encounter: Payer: Self-pay | Admitting: Orthopaedic Surgery

## 2023-08-30 ENCOUNTER — Ambulatory Visit (INDEPENDENT_AMBULATORY_CARE_PROVIDER_SITE_OTHER): Payer: Medicaid Other | Admitting: Orthopaedic Surgery

## 2023-08-30 DIAGNOSIS — M1712 Unilateral primary osteoarthritis, left knee: Secondary | ICD-10-CM | POA: Diagnosis not present

## 2023-08-30 DIAGNOSIS — M170 Bilateral primary osteoarthritis of knee: Secondary | ICD-10-CM | POA: Diagnosis not present

## 2023-08-30 DIAGNOSIS — M1711 Unilateral primary osteoarthritis, right knee: Secondary | ICD-10-CM

## 2023-08-30 MED ORDER — TRULICITY 1.5 MG/0.5ML ~~LOC~~ SOAJ: 1.0000 mg | SUBCUTANEOUS | 1 refills | Status: DC

## 2023-08-30 MED ORDER — METHYLPREDNISOLONE ACETATE 40 MG/ML IJ SUSP
40.0000 mg | INTRAMUSCULAR | Status: AC | PRN
Start: 2023-08-30 — End: 2023-08-30
  Administered 2023-08-30: 40 mg via INTRA_ARTICULAR

## 2023-08-30 MED ORDER — LIDOCAINE HCL 1 % IJ SOLN
2.0000 mL | INTRAMUSCULAR | Status: AC | PRN
Start: 2023-08-30 — End: 2023-08-30
  Administered 2023-08-30: 2 mL

## 2023-08-30 MED ORDER — BUPIVACAINE HCL 0.5 % IJ SOLN
2.0000 mL | INTRAMUSCULAR | Status: AC | PRN
Start: 2023-08-30 — End: 2023-08-30
  Administered 2023-08-30: 2 mL via INTRA_ARTICULAR

## 2023-08-30 NOTE — Progress Notes (Signed)
Office Visit Note   Patient: Jacob Gonzales           Date of Birth: 1956/02/02           MRN: [id number] Visit Date: 08/30/2023              Requested by: Grayce Sessions, NP [address] [place],  [place] [address] PCP: Grayce Sessions, NP   Assessment & Plan: Visit Diagnoses:  1. Bilateral primary osteoarthritis of knee     Plan: Impression is advanced bilateral knee osteoarthritis right greater than left.  Today, we discussed various treatment options to include repeat cortisone injection versus total knee replacement surgery.  Patient is not interested in knee replacement surgery at this time.  He would like to proceed with cortisone injection.  We will proceed with the right more symptomatic knee today.  He may follow-up in 2 weeks for a left knee cortisone injection.  Of note, he is a type II diabetic with his most recent hemoglobin A1c of 8.0 on 07/21/2023.  I have discussed that he will need to have this under 7.8 prior to proceeding with surgical intervention.  He understands and agrees.  He will follow-up as needed.  Follow-Up Instructions: Return if symptoms worsen or fail to improve.   Orders:  Orders Placed This Encounter  Procedures   XR KNEE 3 VIEW LEFT   XR KNEE 3 VIEW RIGHT   No orders of the defined types were placed in this encounter.     Procedures: Large Joint Inj: R knee on 08/30/2023 2:08 PM Indications: pain Details: 22 G needle  Arthrogram: No  Medications: 40 mg methylPREDNISolone acetate 40 MG/ML; 2 mL lidocaine 1 %; 2 mL bupivacaine 0.5 % Consent was given by the patient. Patient was prepped and draped in the usual sterile fashion.       Clinical Data: No additional findings.   Subjective: Chief Complaint  Patient presents with   Right Knee - Pain   Left Knee - Pain    HPI patient is a pleasant 67 year old gentleman who comes in today with bilateral knee pain right greater than left for the past 20 years which has  continued to worsen.  The pain he has is to the entire aspect worse going from a seated to standing position as well as with moving.  He does not take any medication for this.  He has had cortisone injections in the past which have provided about 2 months relief.  His last injections were about 6 to 8 months ago.  Review of Systems as detailed in HPI.  All others reviewed and are negative.   Objective: Vital Signs: There were no vitals taken for this visit.  Physical Exam well-developed well-nourished gentleman in no acute distress.  Alert and oriented x 3.  Ortho Exam bilateral knee exam: Range of motion 0 to 110 degrees.  Medial joint line tenderness on the right.  No joint line tenderness on the left.  He is neurovascular intact distally.  Specialty Comments:  No specialty comments available.  Imaging: XR KNEE 3 VIEW RIGHT  Result Date: 08/30/2023 Bone-on-bone medial and patellofemoral compartments  XR KNEE 3 VIEW LEFT  Result Date: 08/30/2023 Bone-on-bone medial and patellofemoral compartments    PMFS History: Patient Active Problem List   Diagnosis Date Noted   Chronic systolic heart failure (HCC) 04/26/2023   Acute systolic heart failure (HCC) 04/13/2023   STEMI (ST elevation myocardial infarction) (HCC) 04/11/2023  Chest pain 01/15/2021   Pulmonary nodules 01/15/2021   NSTEMI (non-ST elevated myocardial infarction) [facility])    Elevated troponin    Lymphadenopathy    Other chest pain    Syncope 03/03/2019   Acute arterial ischemic stroke, vertebrobasilar, 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   Diabetes (HCC) 11/17/2015   HLD (hyperlipidemia) 11/17/2015   HTN (hypertension) 11/17/2015   Non compliance w medication regimen 11/17/2015   CVA (cerebral infarction) 07/11/2015   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   Arthritis    Bilateral shoulders, Hands, Knees   Bone cancer (HCC) 2016   Brain cancer  (HCC) 2016   Diabetes (HCC) 11/17/2015   HTN (hypertension) 11/17/2015   NSTEMI (non-ST elevated myocardial infarction) (HCC)     Family History  Problem Relation Age of Onset   Heart attack Mother    Heart attack Father    Diabetes Mellitus II Father    Kidney failure Father    Uterine cancer Sister    Heart attack Brother    Heart attack Maternal Aunt    Heart attack Maternal Uncle    Heart attack Paternal Aunt    Heart attack Paternal Uncle    Stroke Neg Hx     Past Surgical History:  Procedure Laterality Date   CORONARY BALLOON ANGIOPLASTY N/A 04/11/2023   Procedure: CORONARY BALLOON ANGIOPLASTY;  Surgeon: Orbie Pyo, MD;  Location: [facility] INV[facility]IVE [facility];  Service: Cardiovascular;  Laterality: N/A;   CORONARY LITHOTRIPSY N/A 04/14/2023   Procedure: CORONARY LITHOTRIPSY;  Surgeon: Orbie Pyo, MD;  Location: [facility] INVAS[facility] CV LAB;  Service: Cardiovascular;  Laterality: N/A;   CORONARY STENT INTERVENTION N/A 01/15/2021   Procedure: CORONARY STENT INTERVENTION;  Surgeon: Corky Crafts, MD;  Location: [facility] INVASIVE CV LAB;  Service: Cardiovascular;  Laterality: N/A;   CORONARY STENT INTERVENTION N/A 04/14/2023   Procedure: CORONARY STENT INTERVENTION;  Surgeon: Orbie Pyo, MD;  Location: [facility] IN[facility]AS[facility] CV [facility];  Service: Cardiovascular;  Laterality: N/A;   CORONARY ULTRASOUND/IVUS N/A 01/15/2021   Procedure: Intravascular Ultrasound/IVUS;  Surgeon: Corky Crafts, MD;  Location: [facility] INVASIVE CV LAB;  Service: Cardiovascular;  Laterality: N/A;   CORONARY ULTRASOUND/IVUS N/A 04/14/2023   Procedure: Coronary Ultrasound/IVUS;  Surgeon: Orbie Pyo, MD;  Location: [facility];  Service: Cardiovascular;  Laterality: N/A;   CORONARY/GRAFT ACUTE MI REVASCULARIZATION N/A 04/11/2023   Procedure: Coronary/Graft Acute MI Revascularization;  Surgeon: Orbie Pyo, MD;  Location: [facility];  Service: Cardiovascular;  Laterality: N/A;   LEFT HEART CATH AND  CORONARY ANGIOGRAPHY N/A 01/15/2021   Procedure: LEFT HEART CATH AND CORONARY ANGIOGRAPHY;  Surgeon: Corky Crafts, MD;  Location: [facility] INVASIVE CV LAB;  Service: Cardiovascular;  Laterality: N/A;   LEFT HEART CATH AND CORONARY ANGIOGRAPHY N/A 04/11/2023   Procedure: LEFT HEART CATH AND CORONARY ANGIOGRAPHY;  Surgeon: Orbie Pyo, MD;  Location: [facility];  Service: Cardiovascular;  Laterality: N/A;   NO PAST SURGERIES     RIGHT HEART CATH N/A 04/13/2023   Procedure: RIGHT HEART CATH;  Surgeon: Dolores Patty, MD;  Location: [facility] INVASIVE CV [facility];  Service: Cardiovascular;  Laterality: N/A;   Social History   Occupational History   Not on file  Tobacco Use   Smoking status: Never   Smokeless tobacco: Never  Vaping Use   Vaping status: Never Used  Substance and Sexual Activity   Alcohol use: Yes  Drug use: No   Sexual activity: Not on file

## 2023-09-13 ENCOUNTER — Ambulatory Visit: Payer: Medicaid Other | Admitting: Orthopaedic Surgery

## 2023-09-15 ENCOUNTER — Telehealth (HOSPITAL_COMMUNITY): Payer: Self-pay | Admitting: Cardiology

## 2023-09-15 NOTE — Telephone Encounter (Signed)
Called patient at [phone] to inform patient of PFT testing on 10/14 at 08:30 am at [facility].  Patient mailbox is full and front office unable to leave a voicemail for this patient.

## 2023-09-18 ENCOUNTER — Encounter (HOSPITAL_COMMUNITY): Payer: Medicaid Other | Admitting: Cardiology

## 2023-09-18 ENCOUNTER — Inpatient Hospital Stay (HOSPITAL_COMMUNITY): Admission: RE | Admit: 2023-09-18 | Payer: Medicaid Other | Source: Ambulatory Visit

## 2023-09-20 ENCOUNTER — Encounter (HOSPITAL_COMMUNITY): Payer: Medicaid Other

## 2023-09-20 ENCOUNTER — Encounter (HOSPITAL_COMMUNITY): Payer: Medicaid Other | Admitting: Cardiology

## 2023-09-28 ENCOUNTER — Ambulatory Visit (HOSPITAL_COMMUNITY)
Admission: RE | Admit: 2023-09-28 | Discharge: 2023-09-28 | Disposition: A | Discharge status: Home / Self Care | Payer: Medicaid Other | Source: Ambulatory Visit | Attending: Cardiology | Admitting: Cardiology

## 2023-09-28 DIAGNOSIS — I5022 Chronic systolic (congestive) heart failure: Secondary | ICD-10-CM | POA: Insufficient documentation

## 2023-09-28 LAB — PULMONARY FUNCTION TEST
DL/VA % pred: 113 %
DL/VA: 4.71 ml/min/mmHg/L
DLCO unc % pred: 105 %
DLCO unc: 28.43 ml/min/mmHg
FEF 25-75 Post: 6.14 L/s
FEF 25-75 Pre: 4.74 L/s
FEF2575-%Change-Post: 29 %
FEF2575-%Pred-Post: 221 %
FEF2575-%Pred-Pre: 170 %
FEV1-%Change-Post: 7 %
FEV1-%Pred-Post: 118 %
FEV1-%Pred-Pre: 110 %
FEV1-Post: 4.11 L
FEV1-Pre: 3.84 L
FEV1FVC-%Change-Post: 3 %
FEV1FVC-%Pred-Pre: 112 %
FEV6-%Change-Post: 3 %
FEV6-%Pred-Post: 107 %
FEV6-%Pred-Pre: 103 %
FEV6-Post: 4.75 L
FEV6-Pre: 4.57 L
FEV6FVC-%Change-Post: 0 %
FEV6FVC-%Pred-Post: 105 %
FEV6FVC-%Pred-Pre: 105 %
FVC-%Change-Post: 3 %
FVC-%Pred-Post: 102 %
FVC-%Pred-Pre: 98 %
FVC-Post: 4.75 L
FVC-Pre: 4.57 L
Post FEV1/FVC ratio: 87 %
Post FEV6/FVC ratio: 100 %
Pre FEV1/FVC ratio: 84 %
Pre FEV6/FVC Ratio: 100 %
RV % pred: 91 %
RV: 2.13 L
TLC % pred: 95 %
TLC: 6.66 L

## 2023-09-28 MED ORDER — ALBUTEROL SULFATE (2.5 MG/3ML) 0.083% IN NEBU
2.5000 mg | INHALATION_SOLUTION | Freq: Once | RESPIRATORY_TRACT | Status: AC
  Administered 2023-09-28: 2.5 mg via RESPIRATORY_TRACT

## 2023-10-16 ENCOUNTER — Encounter (HOSPITAL_COMMUNITY): Payer: Medicaid Other | Admitting: Cardiology

## 2023-11-17 ENCOUNTER — Ambulatory Visit (INDEPENDENT_AMBULATORY_CARE_PROVIDER_SITE_OTHER): Payer: Self-pay

## 2023-11-17 NOTE — Telephone Encounter (Signed)
Please reach out to pt and schedule an appt.  Possibly a double book at 910am?

## 2023-11-17 NOTE — Telephone Encounter (Signed)
  Chief Complaint: foot pain Symptoms: R foot pain, 10/10, R great toenail black Frequency: yesterday Pertinent Negatives: Patient denies any swelling or redness Disposition: [] ED /[x] Urgent Care (no appt availability in office) / [] Appointment(In office/virtual)/ []  [place] [facility]/ [] Home Care/ [] Refused Recommended Disposition /[] [place] [facility]/ []  Follow-up with PCP Additional Notes: pt states his daughter come over and was going to cut his toenails but R great toenail black and pain in R foot 10/10 so she was unable to cut them. He states this started yesterday. Doesn't look at his feet often. No appts until 11/21/23. Pt states he needs an AM appt because his son takes him. Offered to schedule UC appt tomorrow but pt unable to go. Provided UC hrs for this weekend in case he was able to go. York Spaniel his son may can take him Sunday AM. Advised to call back Monday if doesn\'t go to schedule appt. Care advice given and pt verbalized understanding.   Reason for Disposition  [1] SEVERE pain (e.g., excruciating, unable to do any normal activities) AND [2] not improved after 2 hours of pain medicine  Answer Assessment - Initial Assessment Questions 1. ONSET: "When did the pain start?"      Yesterday  2. LOCATION: "Where is the pain located?"      Both feet but R foot worse  3. PAIN: "How bad is the pain?"    (Scale 1-10; or mild, moderate, severe)  - MILD (1-3): doesn\'t interfere with normal activities.   - MODERATE (4-7): interferes with normal activities (e.g., work or school) or awakens from sleep, limping.   - SEVERE (8-10): excruciating pain, unable to do any normal activities, unable to walk.      10 /10 6. OTHER SYMPTOMS: "Do you have any other symptoms?" (e.g., leg pain, rash, fever, numbness)     Great R toe toenail black  Protocols used: Foot Pain-A-AH

## 2023-11-20 ENCOUNTER — Ambulatory Visit (INDEPENDENT_AMBULATORY_CARE_PROVIDER_SITE_OTHER): Payer: Medicare HMO | Admitting: Primary Care

## 2023-11-20 VITALS — BP 144/82 | HR 72 | Resp 16 | Wt 262.4 lb

## 2023-11-20 DIAGNOSIS — F4323 Adjustment disorder with mixed anxiety and depressed mood: Secondary | ICD-10-CM

## 2023-11-20 DIAGNOSIS — Z1211 Encounter for screening for malignant neoplasm of colon: Secondary | ICD-10-CM

## 2023-11-20 DIAGNOSIS — Z7984 Long term (current) use of oral hypoglycemic drugs: Secondary | ICD-10-CM

## 2023-11-20 DIAGNOSIS — E119 Type 2 diabetes mellitus without complications: Secondary | ICD-10-CM | POA: Diagnosis not present

## 2023-11-20 DIAGNOSIS — E66812 Obesity, class 2: Secondary | ICD-10-CM

## 2023-11-20 DIAGNOSIS — Z6838 Body mass index (BMI) 38.0-38.9, adult: Secondary | ICD-10-CM

## 2023-11-20 DIAGNOSIS — L602 Onychogryphosis: Secondary | ICD-10-CM | POA: Diagnosis not present

## 2023-11-20 DIAGNOSIS — E669 Obesity, unspecified: Secondary | ICD-10-CM | POA: Diagnosis not present

## 2023-11-20 DIAGNOSIS — Z7985 Long-term (current) use of injectable non-insulin antidiabetic drugs: Secondary | ICD-10-CM

## 2023-11-20 NOTE — Progress Notes (Signed)
[facility]  Bertram Gipe, is a 67 y.o. male  OZD:[id number]  QVZ:[id number]  DOB - 02/11/1956  Chief Complaint  Patient presents with   Foot Pain    B/l        Subjective:   Jay Radach is a 67 y.o. male here today for acute visit. Patient has No headache, No chest pain, No abdominal pain - No Nausea, No new weakness tingling or numbness, No Cough - shortness of breath Foot Pain This is a chronic problem. The current episode started more than 1 month ago. The problem occurs constantly. The problem has been gradually worsening. The symptoms are aggravated by standing, walking and exertion. He has tried nothing for the symptoms. The treatment provided no relief.  Secondary to toe nails   No problems updated.  Comprehensive ROS Pertinent positive and negative noted in HPI   No Known Allergies  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   Arthritis    Bilateral shoulders, Hands, Knees   Bone cancer (HCC) 2016   Brain cancer (HCC) 2016   Diabetes (HCC) 11/17/2015   HTN (hypertension) 11/17/2015   NSTEMI (non-ST elevated myocardial infarction) [facility]     Current Outpatient Medications on File Prior to Visit  Medication Sig Dispense Refill   aspirin 81 MG chewable tablet Chew 1 tablet (81 mg total) by mouth daily. 30 tablet 3   atorvastatin (LIPITOR) 80 MG tablet TAKE ONE TABLET BY MOUTH ONCE DAILY 30 tablet 3   dapagliflozin propanediol (FARXIGA) 10 MG TABS tablet TAKE ONE TABLET BY MOUTH ONCE DAILY 30 tablet 3   ENTRESTO 24-26 MG TAKE ONE TABLET BY MOUTH TWICE DAILY 60 tablet 3   furosemide (LASIX) 40 MG tablet TAKE 1 TABLET (40 MG TOTAL) BY MOUTH AS NEEDED. 30 tablet 3   metoprolol succinate (TOPROL-XL) 25 MG 24 hr tablet TAKE ONE TABLET BY MOUTH ONCE DAILY 30 tablet 3   Misc. Devices (DIGITAL GLASS SCALE) MISC UAD to monitor weight daily Dx: I50.9 1 each 0   nitroGLYCERIN (NITROSTAT)  0.4 MG SL tablet Place 1 tablet (0.4 mg total) under the tongue every 5 (five) minutes as needed for chest pain or shortness of breath. 25 tablet 0   spironolactone (ALDACTONE) 25 MG tablet Take 1 tablet (25 mg total) by mouth daily. 90 tablet 3   tamsulosin (FLOMAX) 0.4 MG CAPS capsule Take 0.4 mg by mouth daily after supper.     ticagrelor (BRILINTA) 90 MG TABS tablet Take 1 tablet (90 mg total) by mouth 2 (two) times daily. 60 tablet 11   TRULICITY 1.5 MG/0.5ML SOPN Inject 1 mg into the skin once a week. Tuesday 6 mL 1   No current facility-administered medications on file prior to visit.   Health Maintenance  Topic Date Due   Medicare Annual Wellness Visit  Never done   COVID-19 Vaccine (1) Never done   Complete foot exam   Never done   Eye exam for diabetics  Never done   Yearly kidney health urinalysis for diabetes  Never done   Hepatitis C Screening  Never done   DTaP/Tdap/Td vaccine (1 - Tdap) Never done   Zoster (Shingles) Vaccine (1 of 2) Never done   Colon Cancer Screening  Never done   Pneumonia Vaccine (2 of 2 - PCV) 01/16/2022   Flu Shot  Never done   Hemoglobin A1C  01/21/2024   Yearly kidney function blood test for diabetes  07/30/2024   HIV Screening  Completed   HPV Vaccine  Aged Out    Objective:   Vitals:   11/20/23 0923 11/20/23 0924  BP: (!) 143/90 (!) 144/82  Pulse: 72   Resp: 16   SpO2: 97%   Weight: 262 lb 6.4 oz (119 kg)      Physical Exam Constitutional:      Appearance: He is obese.  HENT:     Right Ear: Tympanic membrane and external ear normal.     Left Ear: Tympanic membrane and external ear normal.     Nose: Nose normal.  Eyes:     Extraocular Movements: Extraocular movements intact.  Cardiovascular:     Rate and Rhythm: Normal rate and regular rhythm.  Pulmonary:     Effort: Pulmonary effort is normal.     Breath sounds: Normal breath sounds.  Abdominal:     General: Bowel sounds are normal. There is distension.     Palpations:  Abdomen is soft.  Musculoskeletal:        General: Normal range of motion.     Cervical back: Normal range of motion and neck supple.     Right foot: Swelling present.     Comments: Onychauxis bilateral   Skin:    General: Skin is warm and dry.  Neurological:     Mental Status: He is alert and oriented to person, place, and time.  Psychiatric:        Mood and Affect: Mood normal.        Behavior: Behavior normal.       Assessment & Plan  Sylvanus was seen today for foot pain.  Diagnoses and all orders for this visit:  Onychauxis -     Ambulatory referral to Podiatry  Adjustment disorder with mixed anxiety and depressed mood Flowsheet Row Office Visit from 11/20/2023 in [facility]  PHQ-9 Total Score 21      Referred to [facility]  Colon cancer screening -     Ambulatory referral to Gastroenterology  Type 2 diabetes mellitus without complication, without long-term current use of insulin (HCC)  Controlled at 6.5  - educated on lifestyle modifications, including but not limited to diet choices and adding exercise to daily routine.     Patient have been counseled extensively about nutrition and exercise. Other issues discussed during this visit include: low cholesterol diet, weight control and daily exercise, foot care, annual eye examinations at Ophthalmology, importance of adherence with medications and regular follow-up. We also discussed long term complications of uncontrolled diabetes and hypertension.   Return for medical conditions.  The patient was given clear instructions to go to ER or return to medical center if symptoms don't improve, worsen or new problems develop. The patient verbalized understanding. The patient was told to call to get lab results if they haven't heard anything in the next week.   This note has been created with Education officer, environmental. Any transcriptional errors are unintentional.    Grayce Sessions, NP 11/20/2023, 9:56 AM

## 2023-11-22 ENCOUNTER — Telehealth (INDEPENDENT_AMBULATORY_CARE_PROVIDER_SITE_OTHER): Payer: Self-pay | Admitting: Licensed Clinical Social Worker

## 2023-11-22 NOTE — Telephone Encounter (Signed)
LCSWA called patient today to introduce herself and to assess patients' mental health needs. Patient did answer the phone. LCSWA was able to speak with pt about the cause of his depression. Patient was referred by PCP for Depression and grief ,Pt stated he is depressed because his wife passed away and is not interested in services at this time.

## 2023-12-04 ENCOUNTER — Encounter: Payer: Self-pay | Admitting: Podiatry

## 2023-12-04 ENCOUNTER — Ambulatory Visit (INDEPENDENT_AMBULATORY_CARE_PROVIDER_SITE_OTHER): Payer: Medicare HMO | Admitting: Podiatry

## 2023-12-04 VITALS — Ht 69.0 in | Wt 262.4 lb

## 2023-12-04 DIAGNOSIS — Z7901 Long term (current) use of anticoagulants: Secondary | ICD-10-CM | POA: Insufficient documentation

## 2023-12-04 DIAGNOSIS — M79675 Pain in left toe(s): Secondary | ICD-10-CM | POA: Diagnosis not present

## 2023-12-04 DIAGNOSIS — B351 Tinea unguium: Secondary | ICD-10-CM | POA: Insufficient documentation

## 2023-12-04 DIAGNOSIS — E1151 Type 2 diabetes mellitus with diabetic peripheral angiopathy without gangrene: Secondary | ICD-10-CM | POA: Diagnosis not present

## 2023-12-04 DIAGNOSIS — M79674 Pain in right toe(s): Secondary | ICD-10-CM | POA: Diagnosis not present
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
## 2023-12-29 ENCOUNTER — Telehealth (INDEPENDENT_AMBULATORY_CARE_PROVIDER_SITE_OTHER): Payer: Self-pay

## 2023-12-29 NOTE — Telephone Encounter (Unsigned)
Copied from CRM [id number]. Topic: Referral - Request for Referral >> Dec 29, 2023  9:04 AM Marlow Baars wrote: Did the patient discuss referral with their provider in the last year? No (If No - schedule appointment) (If Yes - send message)  Appointment offered? Yes but he wants to see if she would just do the referral  Type of order/referral and detailed reason for visit: He says he is having dental pain from a broke tooth  Preference of office, provider, location: Whatever dentist his provider recommends that accepts his insurance  If referral order, have you been seen by this specialty before? No (If Yes, this issue or another issue? When? Where?  Can we respond through MyChart? No he prefers a call or text  Please assist patient further

## 2023-12-31 ENCOUNTER — Emergency Department (HOSPITAL_COMMUNITY): Payer: 59

## 2023-12-31 ENCOUNTER — Encounter (HOSPITAL_COMMUNITY): Payer: Self-pay

## 2023-12-31 ENCOUNTER — Other Ambulatory Visit: Payer: Self-pay

## 2023-12-31 ENCOUNTER — Emergency Department (HOSPITAL_COMMUNITY)
Admission: EM | Admit: 2023-12-31 | Discharge: 2023-12-31 | Disposition: A | Discharge status: Home / Self Care | Payer: 59 | Attending: Emergency Medicine | Admitting: Emergency Medicine

## 2023-12-31 DIAGNOSIS — Z7982 Long term (current) use of aspirin: Secondary | ICD-10-CM | POA: Insufficient documentation

## 2023-12-31 DIAGNOSIS — I509 Heart failure, unspecified: Secondary | ICD-10-CM | POA: Insufficient documentation

## 2023-12-31 DIAGNOSIS — R0602 Shortness of breath: Secondary | ICD-10-CM | POA: Diagnosis present

## 2023-12-31 DIAGNOSIS — Z79899 Other long term (current) drug therapy: Secondary | ICD-10-CM | POA: Insufficient documentation

## 2023-12-31 DIAGNOSIS — J189 Pneumonia, unspecified organism: Secondary | ICD-10-CM | POA: Insufficient documentation

## 2023-12-31 DIAGNOSIS — E119 Type 2 diabetes mellitus without complications: Secondary | ICD-10-CM | POA: Diagnosis not present

## 2023-12-31 DIAGNOSIS — I110 Hypertensive heart disease with heart failure: Secondary | ICD-10-CM | POA: Diagnosis not present

## 2023-12-31 DIAGNOSIS — I2510 Atherosclerotic heart disease of native coronary artery without angina pectoris: Secondary | ICD-10-CM | POA: Insufficient documentation

## 2023-12-31 LAB — BRAIN NATRIURETIC PEPTIDE: B Natriuretic Peptide: 297.6 pg/mL — ABNORMAL HIGH (ref 0.0–100.0)

## 2023-12-31 LAB — CBC WITH DIFFERENTIAL/PLATELET
Abs Immature Granulocytes: 0.1 10*3/uL — ABNORMAL HIGH (ref 0.00–0.07)
Basophils Absolute: 0.2 10*3/uL — ABNORMAL HIGH (ref 0.0–0.1)
Basophils Relative: 1 %
Eosinophils Absolute: 0.4 10*3/uL (ref 0.0–0.5)
Eosinophils Relative: 3 %
HCT: 51.1 % (ref 39.0–52.0)
Hemoglobin: 17.1 g/dL — ABNORMAL HIGH (ref 13.0–17.0)
Immature Granulocytes: 1 %
Lymphocytes Relative: 24 %
Lymphs Abs: 3.5 10*3/uL (ref 0.7–4.0)
MCH: 30.1 pg (ref 26.0–34.0)
MCHC: 33.5 g/dL (ref 30.0–36.0)
MCV: 89.8 fL (ref 80.0–100.0)
Monocytes Absolute: 1.7 10*3/uL — ABNORMAL HIGH (ref 0.1–1.0)
Monocytes Relative: 12 %
Neutro Abs: 8.9 10*3/uL — ABNORMAL HIGH (ref 1.7–7.7)
Neutrophils Relative %: 59 %
Platelets: 328 10*3/uL (ref 150–400)
RBC: 5.69 MIL/uL (ref 4.22–5.81)
RDW: 14.4 % (ref 11.5–15.5)
WBC: 14.8 10*3/uL — ABNORMAL HIGH (ref 4.0–10.5)
nRBC: 0 % (ref 0.0–0.2)

## 2023-12-31 LAB — BASIC METABOLIC PANEL
Anion gap: 15 (ref 5–15)
BUN: 13 mg/dL (ref 8–23)
CO2: 24 mmol/L (ref 22–32)
Calcium: 9.6 mg/dL (ref 8.9–10.3)
Chloride: 100 mmol/L (ref 98–111)
Creatinine, Ser: 1.03 mg/dL (ref 0.61–1.24)
GFR, Estimated: >60 mL/min (ref >60)
Glucose, Bld: 212 mg/dL — ABNORMAL HIGH (ref 70–99)
Potassium: 4.7 mmol/L (ref 3.5–5.1)
Sodium: 139 mmol/L (ref 135–145)

## 2023-12-31 LAB — TROPONIN I (HIGH SENSITIVITY)
Troponin I (High Sensitivity): 64 ng/L — ABNORMAL HIGH (ref <18)
Troponin I (High Sensitivity): 68 ng/L — ABNORMAL HIGH (ref <18)

## 2023-12-31 MED ORDER — IOHEXOL 350 MG/ML SOLN
75.0000 mL | Freq: Once | INTRAVENOUS | Status: AC | PRN
  Administered 2023-12-31: 75 mL via INTRAVENOUS

## 2023-12-31 MED ORDER — DOXYCYCLINE HYCLATE 100 MG PO TABS
100.0000 mg | ORAL_TABLET | Freq: Once | ORAL | Status: AC
  Administered 2023-12-31: 100 mg via ORAL
  Filled 2023-12-31: qty 1

## 2023-12-31 MED ORDER — DOXYCYCLINE HYCLATE 100 MG PO CAPS: 100.0000 mg | ORAL_CAPSULE | Freq: Two times a day (BID) | ORAL | 0 refills | Status: DC

## 2023-12-31 NOTE — Discharge Instructions (Signed)
Take 2 of your fluid pills a day for the next 4 days and as long as her shortness of breath remains good you can go back to 1 a day.  You were also started on an antibiotic because they saw what looked like some pneumonia in your lungs.  If your symptoms start getting worse return to the emergency room.

## 2023-12-31 NOTE — ED Provider Triage Note (Signed)
Emergency Medicine Provider Triage Evaluation Note  Jacob Gonzales , a 68 y.o. male  was evaluated in triage.  Pt complains of shob. Started at 1 am this morning. Denies chest pain. Worse with lying flat and exertion. Recent H/o STEMI.  Review of Systems  Positive: See above Negative: See above  Physical Exam  BP (!) 165/114 (BP Location: Left Arm)   Pulse 70   Temp 98.1 F (36.7 C)   Resp (!) 22   Ht 5\' 9"  (1.753 m)   Wt 118.8 kg   SpO2 95%   BMI 38.69 kg/m  Gen:   Awake, no distress   Resp:  Normal effort  MSK:   Moves extremities without difficulty  Other:    Medical Decision Making  Medically screening exam initiated at 8:08 AM.  Appropriate orders placed.  Jacob Gonzales was informed that the remainder of the evaluation will be completed by another provider, this initial triage assessment does not replace that evaluation, and the importance of remaining in the ED until their evaluation is complete.  Work up started   Gareth Eagle, [place] 12/31/23 [phone]

## 2023-12-31 NOTE — ED Provider Notes (Signed)
[place] EMERGENCY DEPARTMENT AT [facility] Provider Note   CSN: [id number] Arrival date & time: 12/31/23  7829     History  Chief Complaint  Patient presents with   Shortness of Breath    Jacob Gonzales is a 68 y.o. male.  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  He started noticing this week that he was getting more winded with activity.  He reports the normal work he does that usually does not affect him was causing him to be severely winded where he was huffing and puffing.  Then last night he reports he could not lay down to go to sleep because he could not catch his breath.  He has been compliant with his medication and did take all of his medicines at 5 AM this morning and reports that he has been urinating a lot.  He has not had any cough or chest pain.  He has not noticed new swelling in his lower extremities.  There is been no recent medication changes.  No nasal congestion, fever, abdominal pain, nausea or vomiting.  The history is provided by the patient.  Shortness of Breath      Home Medications Prior to Admission medications   Medication Sig Start Date End Date Taking? Authorizing Provider  doxycycline (VIBRAMYCIN) 100 MG capsule Take 1 capsule (100 mg total) by mouth 2 (two) times daily. 12/31/23  Yes Gwyneth Sprout, MD  aspirin 81 MG chewable tablet Chew 1 tablet (81 mg total) by mouth daily. 04/17/23   Andrey Farmer, PA-C  atorvastatin (LIPITOR) 80 MG tablet TAKE ONE TABLET BY MOUTH ONCE DAILY 08/10/23   Andrey Farmer, PA-C  dapagliflozin propanediol (FARXIGA) 10 MG TABS tablet TAKE ONE TABLET BY MOUTH ONCE DAILY 08/10/23   Andrey Farmer, PA-C  ENTRESTO 24-26 MG TAKE ONE TABLET BY MOUTH TWICE DAILY 08/10/23   Andrey Farmer, PA-C  furosemide (LASIX) 40 MG tablet  TAKE 1 TABLET (40 MG TOTAL) BY MOUTH AS NEEDED. 08/09/23   Sabharwal, Aditya, DO  metoprolol succinate (TOPROL-XL) 25 MG 24 hr tablet TAKE ONE TABLET BY MOUTH ONCE DAILY 08/10/23   Andrey Farmer, PA-C  Misc. Devices (DIGITAL GLASS SCALE) MISC UAD to monitor weight daily Dx: I50.9 04/19/23   Bensimhon, Bevelyn Buckles, MD  nitroGLYCERIN (NITROSTAT) 0.4 MG SL tablet Place 1 tablet (0.4 mg total) under the tongue every 5 (five) minutes as needed for chest pain or shortness of breath. 04/17/23   Andrey Farmer, PA-C  spironolactone (ALDACTONE) 25 MG tablet Take 1 tablet (25 mg total) by mouth daily. 05/24/23 08/22/23  Sabharwal, Aditya, DO  tamsulosin (FLOMAX) 0.4 MG CAPS capsule Take 0.4 mg by mouth daily after supper.    [provider]  ticagrelor (BRILINTA) 90 MG TABS tablet Take 1 tablet (90 mg total) by mouth 2 (two) times daily. 04/14/23   Andrey Farmer, PA-C  TRULICITY 1.5 MG/0.5ML SOPN Inject 1 mg into the skin once a week. Tuesday 08/30/23   Hoy Register, MD      Allergies    Patient has no known allergies.    Review of Systems   Review of Systems  Respiratory:  Positive for shortness of breath.     Physical Exam Updated Vital Signs BP (!) 158/89   Pulse 88  Temp 98.4 F (36.9 C) (Oral)   Resp (!) 22   Ht 5\' 9"  (1.753 m)   Wt 118.8 kg   SpO2 100%   BMI 38.69 kg/m  Physical Exam Vitals and nursing note reviewed.  Constitutional:      General: He is not in acute distress.    Appearance: He is well-developed.  HENT:     Head: Normocephalic and atraumatic.  Eyes:     Conjunctiva/sclera: Conjunctivae normal.     Pupils: Pupils are equal, round, and reactive to light.  Cardiovascular:     Rate and Rhythm: Normal rate and regular rhythm.     Pulses: Normal pulses.     Heart sounds: No murmur heard. Pulmonary:     Effort: Pulmonary effort is normal. No respiratory distress.     Breath sounds: Normal breath sounds. No wheezing or rales.  Abdominal:      General: There is no distension.     Palpations: Abdomen is soft.     Tenderness: There is no abdominal tenderness. There is no guarding or rebound.  Musculoskeletal:        General: Normal range of motion.     Cervical back: Normal range of motion and neck supple.     Right lower leg: Edema present.     Left lower leg: Edema present.     Comments: 1+ pitting edema bilateral tib-fib area  Skin:    General: Skin is warm and dry.     Findings: No erythema or rash.  Neurological:     Mental Status: He is alert and oriented to person, place, and time.  Psychiatric:        Behavior: Behavior normal.     ED Results / Procedures / Treatments   Labs (all labs ordered are listed, but only abnormal results are displayed) Labs Reviewed  BASIC METABOLIC PANEL - Abnormal; Notable for the following components:      Result Value   Glucose, Bld 212 (*)    All other components within normal limits  CBC WITH DIFFERENTIAL/PLATELET - Abnormal; Notable for the following components:   WBC 14.8 (*)    Hemoglobin 17.1 (*)    Neutro Abs 8.9 (*)    Monocytes Absolute 1.7 (*)    Basophils Absolute 0.2 (*)    Abs Immature Granulocytes 0.10 (*)    All other components within normal limits  BRAIN NATRIURETIC PEPTIDE - Abnormal; Notable for the following components:   B Natriuretic Peptide 297.6 (*)    All other components within normal limits  TROPONIN I (HIGH SENSITIVITY) - Abnormal; Notable for the following components:   Troponin I (High Sensitivity) 64 (*)    All other components within normal limits  TROPONIN I (HIGH SENSITIVITY) - Abnormal; Notable for the following components:   Troponin I (High Sensitivity) 68 (*)    All other components within normal limits    EKG EKG Interpretation Date/Time:  Sunday December 31 2023 07:40:22 EST Ventricular Rate:  91 PR Interval:  164 QRS Duration:  78 QT Interval:  430 QTC Calculation: 528 R Axis:   35  Text Interpretation: Normal sinus rhythm  with sinus arrhythmia Anteroseptal infarct , age undetermined new Prolonged QT When compared with ECG of 26-Apr-2023 09:33, PREVIOUS ECG IS PRESENT Confirmed by Gwyneth Sprout ([id number]) on 12/31/2023 11:58:06 AM  Radiology CT Angio Chest PE W and/or Wo Contrast Result Date: 12/31/2023 CLINICAL DATA:  Pulmonary embolism (PE) suspected, high prob abnormal x-ray. Shortness of breath. EXAM: CT  ANGIOGRAPHY CHEST WITH CONTRAST TECHNIQUE: Multidetector CT imaging of the chest was performed using the standard protocol during bolus administration of intravenous contrast. Multiplanar CT image reconstructions and MIPs were obtained to evaluate the vascular anatomy. RADIATION DOSE REDUCTION: This exam was performed according to the departmental dose-optimization program which includes automated exposure control, adjustment of the mA and/or kV according to patient size and/or use of iterative reconstruction technique. CONTRAST:  75mL OMNIPAQUE IOHEXOL 350 MG/ML SOLN COMPARISON:  CT chest from 04/30/2021. FINDINGS: Cardiovascular: No evidence of embolism to the proximal subsegmental pulmonary artery level. Normal cardiac size. Mild pericardial effusion. No aortic aneurysm. There are coronary artery calcifications, in keeping with coronary artery disease. There are also mild peripheral atherosclerotic vascular calcifications of thoracic aorta and its major branches. Mediastinum/Nodes: Visualized thyroid gland appears grossly unremarkable. No solid / cystic mediastinal masses. The esophagus is nondistended precluding optimal assessment. There are few mildly prominent mediastinal and hilar lymph nodes, which do not meet the size criteria for lymphadenopathy and appear grossly similar to the prior study, favoring benign etiology. No axillary lymphadenopathy by size criteria. Lungs/Pleura: The central tracheo-bronchial tree is patent. There are bilateral small-to-moderate pleural effusions with associated compressive atelectatic  changes in the bilateral lower lobes. There are heterogeneous ground-glass opacities mainly in the right upper lobe and small area in the left lung lower lobe, concerning for multilobar pneumonia. Follow-up to clearing is recommended. No suspicious lung mass.  No pneumothorax. Upper Abdomen: Visualized upper abdominal viscera within normal limits. Musculoskeletal: The visualized soft tissues of the chest wall are grossly unremarkable. No suspicious osseous lesions. There are mild multilevel degenerative changes in the visualized spine. Review of the MIP images confirms the above findings. IMPRESSION: 1. No embolism to the proximal subsegmental pulmonary artery level. 2. Bilateral small-to-moderate pleural effusions. Mild pericardial effusion. 3. Heterogeneous ground-glass opacities in the right upper lobe and left lower lobe, concerning for multilobar pneumonia. Follow-up to clearing is recommended. Aortic Atherosclerosis (ICD10-I70.0). Electronically Signed   By: Jules Schick M.D.   On: 12/31/2023 14:37   DG Chest 2 View Result Date: 12/31/2023 CLINICAL DATA:  Shortness of breath. EXAM: CHEST - 2 VIEW COMPARISON:  02/07/2023 FINDINGS: Low volume film. Focal airspace disease noted right apex. There is bibasilar atelectasis or infiltrate with small bilateral pleural effusions. Vascular congestion with overt pulmonary edema. The cardio pericardial silhouette is enlarged. No acute bony abnormality. IMPRESSION: 1. Focal airspace disease right apex. Imaging features are compatible pneumonia. Imaging follow-up recommended to ensure resolution. 2. Bibasilar atelectasis or infiltrate with small bilateral pleural effusions. Electronically Signed   By: Kennith Center M.D.   On: 12/31/2023 08:42    Procedures Procedures    Medications Ordered in ED Medications  doxycycline (VIBRA-TABS) tablet 100 mg (has no administration in time range)  iohexol (OMNIPAQUE) 350 MG/ML injection 75 mL (75 mLs Intravenous Contrast  Given 12/31/23 1401)    ED Course/ Medical Decision Making/ A&P                                 Medical Decision Making Amount and/or Complexity of Data Reviewed External Data Reviewed: notes. Labs: ordered. Decision-making details documented in ED Course. Radiology: ordered and independent interpretation performed. Decision-making details documented in ED Course. ECG/medicine tests: ordered and independent interpretation performed. Decision-making details documented in ED Course.  Risk Prescription drug management.   Pt with multiple medical problems and comorbidities and presenting today with a complaint  that caries a high risk for morbidity and mortality.  Here today with complaints of shortness of breath as above.  Concern for CHF given his extensive cardiac history versus NSTEMI versus PE.  Patient is denying any infectious symptoms to lower suspicion for pneumonia at this time.  Denies any abdominal symptoms.  Patient is mildly hypertensive here but otherwise vital signs are reassuring.  He is satting 100% on room air. I independently interpreted patient's labs and EKG.  EKG without acute changes other than prolonged QT.  CBC with a leukocytosis of 14.8 with a stable hemoglobin of 17, BMP within normal limits except for blood sugar of 212, initial troponin is 64 and a repeat is pending.  BNP elevated at 297 and last August when he was doing well it was in the 60s.  I have independently visualized and interpreted pt's images today.  Chest x-ray today does show some haziness in the right upper lobe.  Radiology said compatible with pneumonia however patient is not having any symptoms of pneumonia also reported some small bilateral effusions.  Will do a CT to evaluate to ensure no other acute findings.  Based on lab results and patient history most likely CHF exacerbation.  3:01 PM CT was negative for PE but did show bilateral small to moderate pleural effusions with a mild pericardial  effusion as well as heterogeneous groundglass opacities in the right upper lobe in the left lower lobe concerning for multilobar pneumonia.  Patient reports that his daughter has been coughing and he thinks she has had the flu but he has not necessarily had much coughing.  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  Since he has been here he reports that urinating and numerous times and reports he feels a lot better.  States he is no longer short of breath with lying in the bed and overall is feeling better.  Discussed with him the CT results and lab results.  Given his symptoms have improved with diuresis had him increase his Lasix to 2 times a day for the next 4 days and started him on doxycycline to cover for atypical infection.  Discussed returning if his symptoms worsen and he is comfortable with this plan.  No evidence of dysrhythmia at this time patient is awake alert in no distress at this time.         Final Clinical Impression(s) / ED Diagnoses Final diagnoses:  Acute on chronic congestive heart failure, unspecified heart failure type (HCC)  Community acquired pneumonia, unspecified laterality    Rx / DC Orders ED Discharge Orders          Ordered    doxycycline (VIBRAMYCIN) 100 MG capsule  2 times daily        12/31/23 1458              Gwyneth Sprout, MD 12/31/23 1507

## 2023-12-31 NOTE — ED Triage Notes (Signed)
Patient reports having sob that started around 0100.  Reports can't lie down to sleep due to sob.  Denies chest pain or leg swelling. Hx of CHF and MI

## 2024-01-02 ENCOUNTER — Telehealth (INDEPENDENT_AMBULATORY_CARE_PROVIDER_SITE_OTHER): Payer: Self-pay | Admitting: Primary Care

## 2024-01-02 ENCOUNTER — Other Ambulatory Visit: Payer: Self-pay

## 2024-01-02 NOTE — Telephone Encounter (Signed)
Called pt to inform him on when his next atp was. Pt did answer and I was able to make him aware

## 2024-01-05 ENCOUNTER — Other Ambulatory Visit: Payer: Self-pay

## 2024-01-05 ENCOUNTER — Telehealth: Payer: Self-pay

## 2024-01-05 NOTE — Telephone Encounter (Signed)
Pharmacy Patient Advocate Encounter  Received notification from [facility] that Prior Authorization for TRULICITY has been APPROVED from 01/01/2024 to 12/04/2024   PA #/Case ID/Reference #: [id number]  DISPENSED YESTERDAY PER [facility]

## 2024-01-15 ENCOUNTER — Ambulatory Visit (INDEPENDENT_AMBULATORY_CARE_PROVIDER_SITE_OTHER): Payer: 59 | Admitting: Primary Care

## 2024-01-15 ENCOUNTER — Encounter (INDEPENDENT_AMBULATORY_CARE_PROVIDER_SITE_OTHER): Payer: Self-pay | Admitting: Primary Care

## 2024-01-15 ENCOUNTER — Encounter: Payer: Self-pay | Admitting: Internal Medicine

## 2024-01-15 VITALS — BP 120/60 | HR 84 | Resp 16 | Ht 70.0 in | Wt 257.2 lb

## 2024-01-15 DIAGNOSIS — Z6836 Body mass index (BMI) 36.0-36.9, adult: Secondary | ICD-10-CM

## 2024-01-15 DIAGNOSIS — E669 Obesity, unspecified: Secondary | ICD-10-CM

## 2024-01-15 DIAGNOSIS — Z7985 Long-term (current) use of injectable non-insulin antidiabetic drugs: Secondary | ICD-10-CM

## 2024-01-15 DIAGNOSIS — E66812 Obesity, class 2: Secondary | ICD-10-CM | POA: Diagnosis not present

## 2024-01-15 DIAGNOSIS — E119 Type 2 diabetes mellitus without complications: Secondary | ICD-10-CM | POA: Diagnosis not present

## 2024-01-15 DIAGNOSIS — E782 Mixed hyperlipidemia: Secondary | ICD-10-CM | POA: Diagnosis not present

## 2024-01-15 DIAGNOSIS — Z1159 Encounter for screening for other viral diseases: Secondary | ICD-10-CM

## 2024-01-15 DIAGNOSIS — Z2821 Immunization not carried out because of patient refusal: Secondary | ICD-10-CM

## 2024-01-15 DIAGNOSIS — Z7984 Long term (current) use of oral hypoglycemic drugs: Secondary | ICD-10-CM

## 2024-01-15 DIAGNOSIS — Z09 Encounter for follow-up examination after completed treatment for conditions other than malignant neoplasm: Secondary | ICD-10-CM

## 2024-01-15 DIAGNOSIS — Z28310 Unvaccinated for covid-19: Secondary | ICD-10-CM

## 2024-01-15 MED ORDER — ALBUTEROL SULFATE HFA 108 (90 BASE) MCG/ACT IN AERS: 2.0000 | INHALATION_SPRAY | Freq: Four times a day (QID) | RESPIRATORY_TRACT | 0 refills | Status: DC | PRN

## 2024-01-15 NOTE — Progress Notes (Signed)
 Subjective:  [name] is a 68 y.o. male presents for hospital follow up . Patient present to Ed with short of  breath felt like you were suffocation only way to breath was to sit up causing no sleep .Admit date to the hospital was 12/31/23, patient was discharged from the hospital on 12/31/23, patient was admitted for: Acute on chronic congestive heart failure, unspecified heart failure type multilobar pneumonia . Today he still has shortness of breath with exertion.  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   Arthritis    Bilateral shoulders, Hands, Knees   Bone cancer (HCC) 2016   Brain cancer (HCC) 2016   Diabetes (HCC) 11/17/2015   HTN (hypertension) 11/17/2015   NSTEMI (non-ST elevated myocardial infarction) (HCC)      No Known Allergies  Current Outpatient Medications on File Prior to Visit  Medication Sig Dispense Refill   aspirin  81 MG chewable tablet Chew 1 tablet (81 mg total) by mouth daily. 30 tablet 3   atorvastatin  (LIPITOR ) 80 MG tablet TAKE ONE TABLET BY MOUTH ONCE DAILY 30 tablet 3   dapagliflozin  propanediol (FARXIGA ) 10 MG TABS tablet TAKE ONE TABLET BY MOUTH ONCE DAILY 30 tablet 3   doxycycline  (VIBRAMYCIN ) 100 MG capsule Take 1 capsule (100 mg total) by mouth 2 (two) times daily. 20 capsule 0   ENTRESTO  24-26 MG TAKE ONE TABLET BY MOUTH TWICE DAILY 60 tablet 3   furosemide  (LASIX ) 40 MG tablet TAKE 1 TABLET (40 MG TOTAL) BY MOUTH AS NEEDED. 30 tablet 3   metoprolol  succinate (TOPROL -XL) 25 MG 24 hr tablet TAKE ONE TABLET BY MOUTH ONCE DAILY 30 tablet 3   Misc. Devices (DIGITAL GLASS SCALE) MISC UAD to monitor weight daily Dx: I50.9 1 each 0   nitroGLYCERIN  (NITROSTAT ) 0.4 MG SL tablet Place 1 tablet (0.4 mg total) under the tongue every 5 (five) minutes as needed for chest pain or shortness of breath. 25 tablet 0   spironolactone  (ALDACTONE ) 25 MG tablet Take 1 tablet (25 mg total) by  mouth daily. 90 tablet 3   tamsulosin (FLOMAX) 0.4 MG CAPS capsule Take 0.4 mg by mouth daily after supper.     ticagrelor  (BRILINTA ) 90 MG TABS tablet Take 1 tablet (90 mg total) by mouth 2 (two) times daily. 60 tablet 11   TRULICITY  1.5 MG/0.5ML SOPN Inject 1 mg into the skin once a week. Tuesday 6 mL 1   No current facility-administered medications on file prior to visit.    Review of System: ROS Comprehensive ROS Pertinent positive and negative noted in HPI   Objective:  BP 120/60   Pulse 84   Resp 16   Ht 5\' 10"  (1.778 m)   Wt 257 lb 3.2 oz (116.7 kg)   SpO2 99%   BMI 36.90 kg/m   Filed Weights   01/15/24 0930  Weight: 257 lb 3.2 oz (116.7 kg)    Physical Exam Vitals reviewed.  Constitutional:      Appearance: He is obese.  HENT:     Head: Normocephalic.     Right Ear: Tympanic membrane and external ear normal.     Left Ear: Tympanic membrane and external ear normal.  Eyes:     Extraocular Movements: Extraocular movements intact.     Pupils: Pupils are equal, round, and reactive to light.  Musculoskeletal:        General: Normal range of motion.  Skin:  General: Skin is warm and dry.  Neurological:     Mental Status: He is alert and oriented to person, place, and time.  Psychiatric:        Mood and Affect: Mood normal.        Behavior: Behavior normal.      Assessment:  Jacob Gonzales was seen today for hospitalization follow-up.  Diagnoses and all orders for this visit:  Type 2 diabetes mellitus without complication, without long-term current use of insulin  (HCC) -     Microalbumin / creatinine urine ratio; Future -     Ambulatory referral to Ophthalmology  Herpes zoster vaccination declined  Tetanus, diphtheria, and acellular pertussis (Tdap) vaccination declined  Pneumococcal vaccination declined  COVID-19 vaccine series declined  Mixed hyperlipidemia -     Lipid panel; Future  Encounter for HCV screening test for low risk patient -     HCV Ab w  Reflex to Quant PCR; Future   Hospital discharge follow-up  See HPI This note has been created with Dragon speech recognition software and smart phrase technology. Any transcriptional errors are unintentional.   Return for follow up will depend on A1C results .  Marius Siemens, NP 01/15/2024, 9:52 AM

## 2024-01-19 ENCOUNTER — Telehealth: Payer: Self-pay

## 2024-01-19 NOTE — Telephone Encounter (Signed)
Attempted to reach patient concerning the blood thinner listed in his chart; unable to leave a message as voicemail is full;  will attempt to reach patient via MyChart; will follow up at a later date/time;

## 2024-01-19 NOTE — Telephone Encounter (Signed)
Dr. Leonides Schanz,  Please review chart.  Patient has not responded to validate if he is or is not taking a blood thinner. However, Jacob Gonzales reviewed chart and states patient's procedure will need to be completed at the hospital.  IF THE PATIENT is NOT taking a blood thinner=OV or direct? (In case) Will attempt to reach patient at a later time to obtain needed info- Please advise Thanks Bre, PV RN

## 2024-01-19 NOTE — Telephone Encounter (Signed)
John, Please review this patient's chart and advise if this patient is appropriate for his procedure at a [facility].  (PV rm 52 01/31/2024)  Thank you Bre, PV RN

## 2024-01-22 ENCOUNTER — Ambulatory Visit (INDEPENDENT_AMBULATORY_CARE_PROVIDER_SITE_OTHER): Payer: 59 | Admitting: Primary Care

## 2024-01-22 DIAGNOSIS — Z7984 Long term (current) use of oral hypoglycemic drugs: Secondary | ICD-10-CM

## 2024-01-22 DIAGNOSIS — E119 Type 2 diabetes mellitus without complications: Secondary | ICD-10-CM

## 2024-01-22 DIAGNOSIS — Z1159 Encounter for screening for other viral diseases: Secondary | ICD-10-CM

## 2024-01-22 DIAGNOSIS — E782 Mixed hyperlipidemia: Secondary | ICD-10-CM

## 2024-01-22 LAB — POCT GLYCOSYLATED HEMOGLOBIN (HGB A1C): HbA1c, POC (controlled diabetic range): 7.3 % — AB (ref 0.0–7.0)

## 2024-01-22 NOTE — Progress Notes (Signed)
Pt came into the office today for A1C and lab work  PCP reviewed last A1C and now elevated but was out of Trulicity for 3 months. He has started taking expecting improvements.

## 2024-01-23 LAB — LIPID PANEL
Chol/HDL Ratio: 3.6 {ratio} (ref 0.0–5.0)
Cholesterol, Total: 135 mg/dL (ref 100–199)
HDL: 37 mg/dL — ABNORMAL LOW (ref >39)
LDL Chol Calc (NIH): 77 mg/dL (ref 0–99)
Triglycerides: 118 mg/dL (ref 0–149)
VLDL Cholesterol Cal: 21 mg/dL (ref 5–40)

## 2024-01-23 LAB — HCV INTERPRETATION

## 2024-01-23 LAB — HCV AB W REFLEX TO QUANT PCR: HCV Ab: NONREACTIVE

## 2024-01-23 NOTE — Telephone Encounter (Signed)
Attempted to reach patient to be scheduled for an OV per Dr. Derek Mound request; unable to speak with patient and unable to leave a voicemail as mailbox is full;  will attempt to reach patient at a later date/time;

## 2024-01-24 NOTE — Telephone Encounter (Signed)
PV and procedure cancelled; patient has yet to call back to the office to be instructed he needs to be scheduled for this OV-  Attempted to reach patient via MyChart without a response- will attempt to reach patient via phone;

## 2024-01-25 ENCOUNTER — Other Ambulatory Visit (INDEPENDENT_AMBULATORY_CARE_PROVIDER_SITE_OTHER): Payer: Self-pay | Admitting: Primary Care

## 2024-01-25 MED ORDER — TRULICITY 1.5 MG/0.5ML ~~LOC~~ SOAJ
1.0000 mg | SUBCUTANEOUS | 1 refills | Status: DC
Start: 2024-01-25 — End: 2024-08-12

## 2024-01-26 NOTE — Telephone Encounter (Signed)
Left message for patient to call back to the office; needs to be scheduled for an office visit as Pre Visit and procedure have been cancelled;

## 2024-01-29 ENCOUNTER — Ambulatory Visit (INDEPENDENT_AMBULATORY_CARE_PROVIDER_SITE_OTHER): Payer: 59

## 2024-01-30 ENCOUNTER — Telehealth: Payer: Self-pay | Admitting: *Deleted

## 2024-01-30 NOTE — Telephone Encounter (Signed)
 Good Afternoon Dr. Gasper Lloyd  We have received a surgical clearance request for [name] to have 3 teeth surgically extracted.  He has a PMH of CAD with NSTEMI 2022 and anterior STEMI 2024 with PCI to high-grade stenosis in the ostial LAD and previous PCI/DES to OM 2, HFrEF, CVA.  Can you please comment on guidance on holding ASA 81 mg and Brilinta 90 mg prior to dental extraction. Please forward you guidance and recommendations to P CV DIV PREOP   Thanks, Robin Searing, NP

## 2024-01-30 NOTE — Telephone Encounter (Signed)
   Pre-operative Risk Assessment    Patient Name: Jacob Gonzales  DOB: [birth year] MRN: [id number]   Date of last office visit: 07/30/24 DR. ADITYA SABHARWAL Date of next office visit: NONE  Request for Surgical Clearance    Procedure:  Dental Extraction - Amount of Teeth to be Pulled:  3 SURGICALLY EXTRACTED TEETH  Date of Surgery:  Clearance TBD                                Surgeon:  DR. Alberteen Spindle, DDS Surgeon's Group or Practice Name:  [facility] Phone number:  [phone] Fax number:  [phone]   Type of Clearance Requested:   - Medical  - Pharmacy:  Hold Aspirin and Ticagrelor (Brilinta)     Type of Anesthesia:  Local -LIDOCAINE WITH EPI   Additional requests/questions:  MEDICATION LIST  [name]   01/30/2024, 2:59 PM

## 2024-01-31 NOTE — Telephone Encounter (Signed)
 Attempted to reach patient, unable to speak with patient, unable to leave a message as mailbox is full; will attempt to reach patient at a later date/time;

## 2024-02-05 ENCOUNTER — Ambulatory Visit (INDEPENDENT_AMBULATORY_CARE_PROVIDER_SITE_OTHER): Payer: 59 | Admitting: Primary Care

## 2024-02-05 ENCOUNTER — Encounter (INDEPENDENT_AMBULATORY_CARE_PROVIDER_SITE_OTHER): Payer: Self-pay | Admitting: Primary Care

## 2024-02-05 VITALS — BP 125/71 | HR 75 | Temp 98.0°F | Resp 14 | Ht 70.0 in | Wt 259.0 lb

## 2024-02-05 DIAGNOSIS — E119 Type 2 diabetes mellitus without complications: Secondary | ICD-10-CM

## 2024-02-05 DIAGNOSIS — E669 Obesity, unspecified: Secondary | ICD-10-CM | POA: Diagnosis not present

## 2024-02-05 DIAGNOSIS — E66812 Obesity, class 2: Secondary | ICD-10-CM

## 2024-02-05 DIAGNOSIS — Z0001 Encounter for general adult medical examination with abnormal findings: Secondary | ICD-10-CM

## 2024-02-05 DIAGNOSIS — Z6837 Body mass index (BMI) 37.0-37.9, adult: Secondary | ICD-10-CM

## 2024-02-05 NOTE — Progress Notes (Signed)
 Annual Wellness Visit     Patient: Jacob Gonzales, Male    DOB: 14-Jun-1956, 68 y.o.   MRN: [id number]  Subjective  Chief Complaint  Patient presents with   Annual Exam    Jacob Gonzales is a 68 y.o. male who presents today for his Annual Wellness Visit. He reports consuming a general diet. The patient has a physically strenuous job, but has no regular exercise apart from work.  He generally feels poorly. He reports sleeping poorly. He does not have additional problems to discuss today.   HPI  Patient Active Problem List   Diagnosis Date Noted   Anticoagulant long-term use 12/04/2023   Pain due to onychomycosis of toenails of both feet 12/04/2023   Chronic systolic heart failure (HCC) 04/26/2023   Acute systolic heart failure (HCC) 04/13/2023   STEMI (ST elevation myocardial infarction) (HCC) 04/11/2023   Chest pain 01/15/2021   Pulmonary nodules 01/15/2021   NSTEMI (non-ST elevated myocardial infarction) [facility])    Elevated troponin    Lymphadenopathy    Other chest pain    Syncope 03/03/2019   Acute arterial ischemic stroke, vertebrobasilar, 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   Diabetes (HCC) 11/17/2015   HLD (hyperlipidemia) 11/17/2015   HTN (hypertension) 11/17/2015   Non compliance w medication regimen 11/17/2015   Cerebral infarction (HCC) 07/11/2015      Medications: Outpatient Medications Prior to Visit  Medication Sig   albuterol (VENTOLIN HFA) 108 (90 Base) MCG/ACT inhaler Inhale 2 puffs into the lungs every 6 (six) hours as needed for wheezing or shortness of breath.   aspirin 81 MG chewable tablet Chew 1 tablet (81 mg total) by mouth daily.   atorvastatin (LIPITOR) 80 MG tablet TAKE ONE TABLET BY MOUTH ONCE DAILY   dapagliflozin propanediol (FARXIGA) 10 MG TABS tablet TAKE ONE TABLET BY MOUTH ONCE DAILY   doxycycline (VIBRAMYCIN) 100 MG capsule Take 1 capsule (100 mg total) by mouth 2 (two) times daily.   ENTRESTO 24-26 MG TAKE ONE TABLET BY MOUTH TWICE DAILY    furosemide (LASIX) 40 MG tablet TAKE 1 TABLET (40 MG TOTAL) BY MOUTH AS NEEDED.   metoprolol succinate (TOPROL-XL) 25 MG 24 hr tablet TAKE ONE TABLET BY MOUTH ONCE DAILY   Misc. Devices (DIGITAL GLASS SCALE) MISC UAD to monitor weight daily Dx: I50.9   nitroGLYCERIN (NITROSTAT) 0.4 MG SL tablet Place 1 tablet (0.4 mg total) under the tongue every 5 (five) minutes as needed for chest pain or shortness of breath.   tamsulosin (FLOMAX) 0.4 MG CAPS capsule Take 0.4 mg by mouth daily after supper.   ticagrelor (BRILINTA) 90 MG TABS tablet Take 1 tablet (90 mg total) by mouth 2 (two) times daily.   TRULICITY 1.5 MG/0.5ML SOAJ Inject 1 mg into the skin once a week. Tuesday   spironolactone (ALDACTONE) 25 MG tablet Take 1 tablet (25 mg total) by mouth daily.   No facility-administered medications prior to visit.    No Known Allergies  Patient Care Team: Grayce Sessions, NP as PCP - General (Internal Medicine)  ROS Comprehensive ROS Pertinent positive and negative noted in HPI      Objective  BP 125/71 (BP Location: Left Arm, Patient Position: Sitting, Cuff Size: Normal)   Pulse 75   Temp 98 F (36.7 C) (Oral)   Resp 14   Ht 5\' 10"  (1.778 m)   Wt 259 lb (117.5 kg)   SpO2 100%   BMI 37.16 kg/m  BP Readings from Last 3 Encounters:  02/05/24 125/71  01/15/24 120/60  12/31/23 (!) 158/89      Physical Exam Vitals reviewed.  Constitutional:      Appearance: He is obese.  HENT:     Head: Normocephalic.     Right Ear: Tympanic membrane and external ear normal.     Left Ear: Tympanic membrane and external ear normal.     Nose: Nose normal.  Eyes:     Extraocular Movements: Extraocular movements intact.     Pupils: Pupils are equal, round, and reactive to light.  Cardiovascular:     Rate and Rhythm: Normal rate and regular rhythm.  Pulmonary:     Effort: Pulmonary effort is normal.     Breath sounds: Normal breath sounds.  Abdominal:     General: Bowel sounds are normal.  There is distension.     Palpations: Abdomen is soft.  Musculoskeletal:        General: Normal range of motion.  Skin:    General: Skin is warm and dry.  Neurological:     Mental Status: He is oriented to person, place, and time.  Psychiatric:        Mood and Affect: Mood normal.        Behavior: Behavior normal.        Thought Content: Thought content normal.        Judgment: Judgment normal.    Most recent functional status assessment:    04/13/2023   10:00 PM  In your present state of health, do you have any difficulty performing the following activities:  Hearing? 0  Vision? 0  Difficulty concentrating or making decisions? 0  Walking or climbing stairs? 0  Dressing or bathing? 0  Doing errands, shopping? 0   Most recent fall risk assessment:    02/05/2024    8:48 AM  Fall Risk   Falls in the past year? 0  Number falls in past yr: 0  Injury with Fall? 0    Most recent depression screenings:    02/05/2024    8:48 AM 11/20/2023    9:24 AM  PHQ 2/9 Scores  PHQ - 2 Score 0 6  PHQ- 9 Score 0 21   Most recent cognitive screening:     No data to display         Most recent Audit-C alcohol use screening     No data to display         A score of 3 or more in women, and 4 or more in men indicates increased risk for alcohol abuse, EXCEPT if all of the points are from question 1   Vision/Hearing Screen: No results found.  Last CBC Lab Results  Component Value Date   WBC 14.8 (H) 12/31/2023   HGB 17.1 (H) 12/31/2023   HCT 51.1 12/31/2023   MCV 89.8 12/31/2023   MCH 30.1 12/31/2023   RDW 14.4 12/31/2023   PLT 328 12/31/2023   Last metabolic panel Lab Results  Component Value Date   GLUCOSE 212 (H) 12/31/2023   NA 139 12/31/2023   K 4.7 12/31/2023   CL 100 12/31/2023   CO2 24 12/31/2023   BUN 13 12/31/2023   CREATININE 1.03 12/31/2023   GFRNONAA >60 12/31/2023   CALCIUM 9.6 12/31/2023   PROT 7.4 04/11/2023   ALBUMIN 3.6 04/11/2023   BILITOT 1.0  04/11/2023   ALKPHOS 77 04/11/2023   AST 24 04/11/2023   ALT 19 04/11/2023   ANIONGAP 15 12/31/2023   Last lipids Lab Results  Component  Value Date   CHOL 135 01/22/2024   HDL 37 (L) 01/22/2024   LDLCALC 77 01/22/2024   TRIG 118 01/22/2024   CHOLHDL 3.6 01/22/2024   Last hemoglobin A1c Lab Results  Component Value Date   HGBA1C 7.3 (A) 01/22/2024      No results found for any visits on 02/05/24.    Assessment & Plan   Annual wellness visit done today including the all of the following: Reviewed patient's Family Medical History Reviewed and updated list of patient's medical providers Assessment of cognitive impairment was done Assessed patient's functional ability Established a written schedule for health screening services Health Risk Assessent Completed and Reviewed  Exercise Activities and Dietary recommendations  Goals   None     Immunization History  Administered Date(s) Administered   Pneumococcal Polysaccharide-23 01/16/2021    Health Maintenance  Topic Date Due   Medicare Annual Wellness (AWV)  Never done   COVID-19 Vaccine (1) Never done   OPHTHALMOLOGY EXAM  Never done   Diabetic kidney evaluation - Urine ACR  Never done   Colonoscopy  Never done   INFLUENZA VACCINE  03/04/2024 (Originally 07/06/2023)   Zoster Vaccines- Shingrix (1 of 2) 04/13/2024 (Originally 10/27/1977)   DTaP/Tdap/Td (1 - Tdap) 01/14/2025 (Originally 10/27/1977)   Pneumonia Vaccine 44+ Years old (2 of 2 - PCV) 01/14/2025 (Originally 01/16/2022)   HEMOGLOBIN A1C  07/21/2024   Diabetic kidney evaluation - eGFR measurement  12/30/2024   FOOT EXAM  01/14/2025   Hepatitis C Screening  Completed   HIV Screening  Completed   HPV VACCINES  Aged Out     Discussed health benefits of physical activity, and encouraged him to engage in regular exercise appropriate for his age and condition.   Jacob Gonzales was seen today for annual exam.  Diagnoses and all orders for this visit:  Type 2  diabetes mellitus without complication, without long-term current use of insulin (HCC) -     Microalbumin / creatinine urine ratio -     Ambulatory referral to Ophthalmology     Grayce Sessions, NP

## 2024-02-06 ENCOUNTER — Encounter (INDEPENDENT_AMBULATORY_CARE_PROVIDER_SITE_OTHER): Payer: Self-pay | Admitting: *Deleted

## 2024-02-07 NOTE — Telephone Encounter (Signed)
   Patient Name: Jacob Gonzales  DOB: 05/13/1956 MRN: [id number]  Primary Cardiologist: None  Chart reviewed as part of pre-operative protocol coverage.   Please advise patient that clearance was not granted for his dental extractions.  He will need to be seen by a new interventional cardiologist in order to receive clearance and guidance on holding Brilinta.  Please contact patient and schedule for appointment with new interventional cardiologist in order for further guidance to be obtained regarding holding Brilinta.   Napoleon Form, Leodis Rains, NP 02/07/2024, 3:19 PM

## 2024-02-07 NOTE — Telephone Encounter (Signed)
 Message sent to scheduling team: Does the pt need a new pt appt to [facility] or just make an appt w/o being a new pt appt?

## 2024-02-09 NOTE — Telephone Encounter (Signed)
 Pt has been scheduled NEW PT APPT PRE OP CLEARANCE with Dr. [place] 04/16/24. I will update al parties involved.

## 2024-02-09 NOTE — Telephone Encounter (Signed)
 See notes pt's NEW PT APPT has been moved up to 02/12/24 with Dr. Lynnette Caffey.

## 2024-02-09 NOTE — Telephone Encounter (Signed)
 Called to schedule NP appt with gen card APP, MB is full

## 2024-02-11 NOTE — Progress Notes (Unsigned)
 Cardiology Office Note:   Date:  02/11/2024  ID:  Jacob Gonzales, DOB 1956/06/20, MRN [id number] PCP:  Grayce Sessions, NP  [facility] Providers Cardiologist:  Alverda Skeans, MD Referring MD: Grayce Sessions, NP  Chief Complaint/Reason for Referral: Preoperative cardiac evaluation for dental extraction ASSESSMENT:    1. Acute coronary syndrome (HCC)   2. Chronic combined systolic and diastolic heart failure (HCC)   3. Type 2 diabetes mellitus with complication, without long-term current use of insulin (HCC)   4. Hypertension associated with diabetes (HCC)   5. Hyperlipidemia associated with type 2 diabetes mellitus (HCC)   6. Aortic atherosclerosis (HCC)   7. BMI 38.0-38.9,adult   8. Preoperative cardiovascular examination     PLAN:   In order of problems listed above: Acute coronary syndrome: Continue aspirin 81 mg, Brilinta 90 mg twice daily, as needed nitroglycerin, atorvastatin 80 mg daily.  Would recommend completing 1 years worth of antiplatelet therapy prior to elective dental procedure.*** Chronic systolic and diastolic heart failure: Continue Farxiga 10 mg, increase Entresto to mid dose, continue Toprol 25 mg, and spironolactone 25 mg.  Will obtain echocardiogram in 1 month.  If ejection fraction remains less than 35% we will refer to EP for recommendations regarding primary prevention ICD.*** T2DM: Continue aspirin 81 mg daily, atorvastatin 80 mg daily, Farxiga 10 mg daily, and Entresto increased to mid dose.   Hypertension: Continue Toprol 25 mg daily, spironolactone 25 mg daily, increase Entresto to mid dose.*** Hyperlipidemia: Check lipid panel, LFTs today.  Goal LDL is less than 55 given history of ACS.*** Aortic atherosclerosis: Continue aspirin 81 mg, atorvastatin 80 mg, and strict blood pressure control. Elevated BMI: Continue Trulicity 1 mg q. weekly. Preoperative cardiovascular examination: As detailed above would complete 1 years worth of dual antiplatelet  therapy given the left main stenting prior to elective dental procedure.  After May the patient may undergo this elective procedure.  At that time the patient's Brilinta can be stopped and the procedure should be done on aspirin 81 mg only.  Following the dental procedure aspirin should be discontinued and Brilinta 90 mg twice daily continued as indefinite monotherapy.        {Are you ordering a CV Procedure (e.g. stress test, cath, DCCV, TEE, etc)?   Press F2        :[id number]}   Dispo:  No follow-ups on file.      Medication Adjustments/Labs and Tests Ordered: Current medicines are reviewed at length with the patient today.  Concerns regarding medicines are outlined above.  The following changes have been made:  {PLAN; NO CHANGE:13088:s}   Labs/tests ordered: No orders of the defined types were placed in this encounter.   Medication Changes: No orders of the defined types were placed in this encounter.   Current medicines are reviewed at length with the patient today.  The patient {ACTIONS; HAS/DOES NOT HAVE:19233} concerns regarding medicines.  I spent *** minutes reviewing all clinical data during and prior to this visit including all relevant imaging studies, laboratories, clinical information from other health systems and prior notes from both Cardiology and other specialties, interviewing the patient, conducting a complete physical examination, and coordinating care in order to formulate a comprehensive and personalized evaluation and treatment plan.   History of Present Illness:      FOCUSED PROBLEM LIST:   CAD Status post remote PCI of obtuse marginal Late presentation anterior ST elevation myocardial infarction Balloon angioplasty ostial LAD CMR no anterior wall viability Surgical turndown  PCI of the left main into left circumflex with Cutting Balloon and coronary lithotripsy May 2024 Ischemic cardiomyopathy EF 25 to 30% TTE May 2024 EF of 30 to 35% TTE August  2024 Diastolic dysfunction G1 DD, EF 30 to 35%, no significant valve problems TTE August 2024 Hypertension Hyperlipidemia LP(a) 18.4 Aortic atherosclerosis Chest CT 2025 BMI 38  March 2025:  Patient consents to use of AI scribe.***  The patient is 68 year old male with the above listed medical problems here for recommendations prior to dental extraction.  The patient has a complicated medical history.  He has a history of remote PCI of the obtuse marginal what presented in May 2024 with a late anterior ST elevation myocardial infarction.  He underwent balloon angioplasty of the left main into the ostial LAD acutely.  He was found to have multivessel disease.  He was medically stabilized.  An echocardiogram demonstrated an EF of 25 to 30%.  Due to very poorly controlled diabetes he was referred for PCI.  CMR demonstrated no anterior wall viability and therefore PCI of the left main to the left circumflex with Cutting Balloon angioplasty and coronary lithotripsy was performed.  He is scheduled for dental extraction and is here for recommendations regarding dual antiplatelet therapy and management around this planned procedure.          Current Medications: No outpatient medications have been marked as taking for the 02/12/24 encounter (Appointment) with Orbie Pyo, MD.     Review of Systems:   Please see the history of present illness.    All other systems reviewed and are negative.     EKGs/Labs/Other Test Reviewed:   EKG: EKG from January 2025 demonstrates normal sinus rhythm with anteroseptal MI pattern  EKG Interpretation Date/Time:    Ventricular Rate:    PR Interval:    QRS Duration:    QT Interval:    QTC Calculation:   R Axis:      Text Interpretation:           Risk Assessment/Calculations:   {Does this patient have ATRIAL FIBRILLATION?[phone]}      Physical Exam:   VS:  There were no vitals taken for this visit.   No BP recorded.  {Refresh Note OR  Click here to enter BP  :1}***   Wt Readings from Last 3 Encounters:  02/05/24 259 lb (117.5 kg)  01/15/24 257 lb 3.2 oz (116.7 kg)  12/31/23 262 lb (118.8 kg)      GENERAL:  No apparent distress, AOx3 HEENT:  No carotid bruits, +2 carotid impulses, no scleral icterus CAR: RRR Irregular RR*** no murmurs***, gallops, rubs, or thrills RES:  Clear to auscultation bilaterally ABD:  Soft, nontender, nondistended, positive bowel sounds x 4 VASC:  +2 radial pulses, +2 carotid pulses NEURO:  CN 2-12 grossly intact; motor and sensory grossly intact PSYCH:  No active depression or anxiety EXT:  No edema, ecchymosis, or cyanosis  Signed, Orbie Pyo, MD  02/11/2024 9:10 PM    [facility] [address] [place], [place], [place]  [address] Phone: [phone]; Fax: [phone]   Note:  This document was prepared using Dragon voice recognition software and may include unintentional dictation errors.

## 2024-02-12 ENCOUNTER — Encounter: Payer: Self-pay | Admitting: Internal Medicine

## 2024-02-12 ENCOUNTER — Ambulatory Visit: Attending: Internal Medicine | Admitting: Internal Medicine

## 2024-02-12 ENCOUNTER — Other Ambulatory Visit (HOSPITAL_COMMUNITY): Payer: Self-pay | Admitting: Physician Assistant

## 2024-02-12 ENCOUNTER — Other Ambulatory Visit (HOSPITAL_COMMUNITY): Payer: Self-pay | Admitting: Cardiology

## 2024-02-12 VITALS — BP 120/68 | HR 74 | Ht 70.0 in | Wt 256.0 lb

## 2024-02-12 DIAGNOSIS — E785 Hyperlipidemia, unspecified: Secondary | ICD-10-CM

## 2024-02-12 DIAGNOSIS — E1169 Type 2 diabetes mellitus with other specified complication: Secondary | ICD-10-CM

## 2024-02-12 DIAGNOSIS — I5042 Chronic combined systolic (congestive) and diastolic (congestive) heart failure: Secondary | ICD-10-CM | POA: Diagnosis not present

## 2024-02-12 DIAGNOSIS — E118 Type 2 diabetes mellitus with unspecified complications: Secondary | ICD-10-CM | POA: Diagnosis not present

## 2024-02-12 DIAGNOSIS — Z01810 Encounter for preprocedural cardiovascular examination: Secondary | ICD-10-CM | POA: Diagnosis not present

## 2024-02-12 DIAGNOSIS — I249 Acute ischemic heart disease, unspecified: Secondary | ICD-10-CM | POA: Diagnosis not present

## 2024-02-12 DIAGNOSIS — Z6838 Body mass index (BMI) 38.0-38.9, adult: Secondary | ICD-10-CM

## 2024-02-12 DIAGNOSIS — I152 Hypertension secondary to endocrine disorders: Secondary | ICD-10-CM

## 2024-02-12 DIAGNOSIS — E1159 Type 2 diabetes mellitus with other circulatory complications: Secondary | ICD-10-CM

## 2024-02-12 DIAGNOSIS — I700 Atherosclerosis of aorta: Secondary | ICD-10-CM

## 2024-02-12 MED ORDER — SACUBITRIL-VALSARTAN 49-51 MG PO TABS: 1.0000 | ORAL_TABLET | Freq: Two times a day (BID) | ORAL | 11 refills | Status: DC

## 2024-02-12 MED ORDER — EZETIMIBE 10 MG PO TABS: 10.0000 mg | ORAL_TABLET | Freq: Every day | ORAL | 3 refills | Status: AC

## 2024-02-12 NOTE — Patient Instructions (Signed)
 Medication Instructions:  Your physician has recommended you make the following change in your medication:   1) INCREASE Entresto to 49-51 mg twice daily 2) START ezetimibe (Zetia) 10 mg daily  *If you need a refill on your cardiac medications before your next appointment, please call your pharmacy*  Lab Work: In 2 months (around Apr 13, 2024) at any [facility]: Lipid panel, LFTs, LP(a) You may go to any of these [facility] locations:   [facility] - [id number] ([facility]) - [address] [phone] [address]   If you have labs (blood work) drawn today and your tests are completely normal, you will receive your results only by: [employer] (if you have MyChart) OR A paper copy in the mail If you have any lab test that is abnormal or we need to change your treatment, we will call you to review the results.  Testing/Procedures: Your physician has requested that you have an echocardiogram in June 2025. Echocardiography is a painless test that uses sound waves to create images of your heart. It provides your doctor with information about the size and shape of your heart and how well your heart's chambers and valves are working. This procedure takes approximately one hour. There are no restrictions for this procedure. Please do NOT wear cologne, perfume, aftershave, or lotions (deodorant is allowed). Please arrive 15 minutes prior to your appointment time.  Please note: We ask at that you not bring children with you during ultrasound (echo/ vascular) testing. Due to room size and safety concerns, children are not allowed in the ultrasound rooms during exams. Our front office staff cannot provide observation of children in our lobby area while testing is being conducted. An adult accompanying a patient to their appointment will only be allowed in the ultrasound room at the discretion of the ultrasound technician under special circumstances.  We apologize for any inconvenience.  Follow-Up: At [facility], you and your health needs are our priority.  As part of our continuing mission to provide you with exceptional heart care, we have created designated Provider Care Teams.  These Care Teams include your primary Cardiologist (physician) and Advanced Practice Providers (APPs -  Physician Assistants and Nurse Practitioners) who all work together to provide you with the care you need, when you need it.  We recommend signing up for the patient portal called "MyChart".  Sign up information is provided on this After Visit Summary.  MyChart is used to connect with patients for Virtual Visits (Telemedicine).  Patients are able to view lab/test results, encounter notes, upcoming appointments, etc.  Non-urgent messages can be sent to your provider as well.   To learn more about what you can do with MyChart, go to [email or web address].    Your next appointment:   3 month(s)  The format for your next appointment:   In Person  Provider:   Jari Favre, PA-C, Ronie Spies, PA-C, Robin Searing, NP, or Tereso Newcomer, PA-  Other Instructions   1st Floor: - Lobby - Registration  - Pharmacy  - Lab - Cafe  2nd Floor: - PV Lab - Diagnostic Testing (echo, CT, nuclear med)  3rd Floor: - Vacant  4th Floor: - TCTS (cardiothoracic surgery) - AFib Clinic - Structural Heart Clinic - Vascular Surgery  - Vascular Ultrasound  5th Floor: - HeartCare Cardiology (general and EP) - Clinical Pharmacy for coumadin, hypertension, lipid, weight-loss medications, and med management appointments    Valet parking  services will be available as well.

## 2024-02-15 ENCOUNTER — Telehealth (INDEPENDENT_AMBULATORY_CARE_PROVIDER_SITE_OTHER): Payer: Self-pay | Admitting: Primary Care

## 2024-02-15 NOTE — Telephone Encounter (Signed)
 E2C2 called with pt stating he received a letter in the mail regarding his labs pertaining to a recent visit. I informed pt that someone from the office will reach back out to pt. Please advise.

## 2024-02-15 NOTE — Telephone Encounter (Signed)
Returned pt call and made aware of lab results pt doesn't have any questions or concerns

## 2024-02-19 ENCOUNTER — Encounter (INDEPENDENT_AMBULATORY_CARE_PROVIDER_SITE_OTHER): Payer: Self-pay

## 2024-02-19 ENCOUNTER — Ambulatory Visit (INDEPENDENT_AMBULATORY_CARE_PROVIDER_SITE_OTHER): Payer: Medicare HMO | Admitting: Primary Care

## 2024-02-20 ENCOUNTER — Telehealth (INDEPENDENT_AMBULATORY_CARE_PROVIDER_SITE_OTHER): Payer: Self-pay

## 2024-02-20 NOTE — Telephone Encounter (Signed)
 Copied from CRM [id number]. 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 Patient should follow up 6-12 months for follow up exam. Cb#: [phone]

## 2024-02-21 ENCOUNTER — Encounter: Payer: 59 | Admitting: Internal Medicine

## 2024-02-22 NOTE — Telephone Encounter (Signed)
 Was this exam done by the insurance company or an ophthalmologist? Please call back to confirm. If done by insurance he will need an ophthalmology referral. You can place that referral if needed and use the diagnosis of diabetic retinopathy.

## 2024-02-27 ENCOUNTER — Ambulatory Visit (INDEPENDENT_AMBULATORY_CARE_PROVIDER_SITE_OTHER): Payer: Self-pay | Admitting: Primary Care

## 2024-03-02 LAB — BASIC METABOLIC PANEL WITH GFR: Creatinine: 0.8 (ref 0.6–1.3)

## 2024-03-02 LAB — COMPREHENSIVE METABOLIC PANEL WITH GFR: eGFR: 90.001

## 2024-03-02 LAB — MICROALBUMIN / CREATININE URINE RATIO: Microalb Creat Ratio: 29.999

## 2024-03-04 ENCOUNTER — Encounter (INDEPENDENT_AMBULATORY_CARE_PROVIDER_SITE_OTHER): Payer: Medicare HMO | Admitting: Podiatry

## 2024-03-04 DIAGNOSIS — Z91199 Patient's noncompliance with other medical treatment and regimen due to unspecified reason: Secondary | ICD-10-CM

## 2024-03-04 NOTE — Progress Notes (Signed)
Patient did not show for scheduled appointment today.

## 2024-03-06 NOTE — Telephone Encounter (Signed)
 Attempted to reach patient- unable to leave a voicemail- as number rings then disconnects- will wait for patient to call office since PV and procedure have been cancelled; will send letter to patient requesting he call the office back;

## 2024-03-14 ENCOUNTER — Other Ambulatory Visit (INDEPENDENT_AMBULATORY_CARE_PROVIDER_SITE_OTHER): Payer: Self-pay

## 2024-04-01 ENCOUNTER — Other Ambulatory Visit (HOSPITAL_COMMUNITY): Payer: Self-pay | Admitting: Physician Assistant

## 2024-04-16 ENCOUNTER — Ambulatory Visit: Admitting: Cardiology

## 2024-04-23 LAB — HM DIABETES EYE EXAM

## 2024-04-30 ENCOUNTER — Ambulatory Visit (INDEPENDENT_AMBULATORY_CARE_PROVIDER_SITE_OTHER): Payer: 59 | Admitting: Primary Care

## 2024-04-30 ENCOUNTER — Telehealth (INDEPENDENT_AMBULATORY_CARE_PROVIDER_SITE_OTHER): Payer: Self-pay | Admitting: Primary Care

## 2024-04-30 NOTE — Telephone Encounter (Signed)
 Called pt to offer a virtual appt because pt was late and or missed appt. Pt did not answer and VM left.

## 2024-05-01 ENCOUNTER — Telehealth (INDEPENDENT_AMBULATORY_CARE_PROVIDER_SITE_OTHER): Payer: Self-pay | Admitting: Primary Care

## 2024-05-01 ENCOUNTER — Other Ambulatory Visit (HOSPITAL_COMMUNITY): Payer: Self-pay | Admitting: Cardiology

## 2024-05-01 NOTE — Telephone Encounter (Signed)
 Called pt and was able to get him rescheduled.

## 2024-05-06 ENCOUNTER — Telehealth (INDEPENDENT_AMBULATORY_CARE_PROVIDER_SITE_OTHER): Payer: Self-pay | Admitting: Primary Care

## 2024-05-06 ENCOUNTER — Ambulatory Visit: Payer: Self-pay | Admitting: Internal Medicine

## 2024-05-06 ENCOUNTER — Ambulatory Visit (HOSPITAL_COMMUNITY)
Admission: RE | Admit: 2024-05-06 | Discharge: 2024-05-06 | Disposition: A | Discharge status: Home / Self Care | Source: Ambulatory Visit | Attending: Cardiovascular Disease | Admitting: Cardiovascular Disease

## 2024-05-06 DIAGNOSIS — I5042 Chronic combined systolic (congestive) and diastolic (congestive) heart failure: Secondary | ICD-10-CM

## 2024-05-06 LAB — ECHOCARDIOGRAM COMPLETE
Area-P 1/2: 3.6 cm2
Calc EF: 38.5 %
S' Lateral: 4.5 cm
Single Plane A2C EF: 35 %
Single Plane A4C EF: 43.2 %

## 2024-05-06 MED ORDER — PERFLUTREN LIPID MICROSPHERE
1.0000 mL | INTRAVENOUS | Status: AC | PRN
  Administered 2024-05-06: 2 mL via INTRAVENOUS

## 2024-05-06 NOTE — Telephone Encounter (Signed)
 Called pt to confirm appt. Pt will be present.

## 2024-05-07 ENCOUNTER — Ambulatory Visit (INDEPENDENT_AMBULATORY_CARE_PROVIDER_SITE_OTHER): Admitting: Primary Care

## 2024-05-07 VITALS — Wt 250.4 lb

## 2024-05-07 DIAGNOSIS — E782 Mixed hyperlipidemia: Secondary | ICD-10-CM | POA: Diagnosis not present

## 2024-05-07 DIAGNOSIS — I5022 Chronic systolic (congestive) heart failure: Secondary | ICD-10-CM | POA: Diagnosis not present

## 2024-05-07 DIAGNOSIS — Z7984 Long term (current) use of oral hypoglycemic drugs: Secondary | ICD-10-CM

## 2024-05-07 DIAGNOSIS — E119 Type 2 diabetes mellitus without complications: Secondary | ICD-10-CM

## 2024-05-07 LAB — POCT GLYCOSYLATED HEMOGLOBIN (HGB A1C): HbA1c, POC (controlled diabetic range): 6.9 % (ref 0.0–7.0)

## 2024-05-07 LAB — GLUCOSE, POCT (MANUAL RESULT ENTRY): POC Glucose: 151 mg/dL — AB (ref 70–99)

## 2024-05-08 LAB — HEPATIC FUNCTION PANEL
ALT: 24 IU/L (ref 0–44)
AST: 20 IU/L (ref 0–40)
Albumin: 4.4 g/dL (ref 3.9–4.9)
Alkaline Phosphatase: 125 IU/L — ABNORMAL HIGH (ref 44–121)
Bilirubin Total: 0.6 mg/dL (ref 0.0–1.2)
Bilirubin, Direct: 0.22 mg/dL (ref 0.00–0.40)
Total Protein: 7.6 g/dL (ref 6.0–8.5)

## 2024-05-08 LAB — LIPID PANEL
Chol/HDL Ratio: 3 ratio (ref 0.0–5.0)
Cholesterol, Total: 118 mg/dL (ref 100–199)
HDL: 40 mg/dL (ref >39)
LDL Chol Calc (NIH): 60 mg/dL (ref 0–99)
Triglycerides: 97 mg/dL (ref 0–149)
VLDL Cholesterol Cal: 18 mg/dL (ref 5–40)

## 2024-05-08 LAB — LIPOPROTEIN A (LPA): Lipoprotein (a): 24.8 nmol/L (ref <75.0)

## 2024-05-10 ENCOUNTER — Telehealth (HOSPITAL_COMMUNITY): Payer: Self-pay | Admitting: Cardiology

## 2024-05-10 NOTE — Telephone Encounter (Signed)
 Called to confirm/remind patient of their appointment [facility] on 05/10/2024.   Appointment:   [x] Confirmed  [] Left mess   [] No answer/No voice mail  [] VM Full/unable to leave message  [] Phone not in service  Patient reminded to bring all medications and/or complete list.  Confirmed patient has transportation. Gave directions, instructed to utilize valet parking.

## 2024-05-12 NOTE — Progress Notes (Signed)
 [facility] NOTE  Referring Physician: Marius Siemens, NP  Primary Care: Marius Siemens, NP Primary Cardiologist:  HPI: 68 y.o.. 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 His history with us  dates back to 5/24 when he was admitted with an with anterior STEMI. LHC w/ High grade  LM, ostial LAD (treated with balloon angioplasty), ostial D1 and ostial ramus disease. Given severity of disease, surgical revascularization recommended. Echo w/ EF 25-30%, WMA consistent with LAD territory infarct. CMRI showed w/ nonviable anterior myocardium, LVEF 33%, RVEF 48%. RHC with preserved CO. Ultimately decided against CABG and underwent IVUS guided PCI d LM into LCX.   Interval hx:  Since that time he has been seen in APP/TOC clinic where medications were further uptitrated. 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 During this time he has no shortness of breath, no chest pain, minimal lightheadedness and no palpitations; no LE edema.   Activity level/exercise tolerance:  NYHA IIB Orthopnea:  Sleeps on 2 pillows Paroxysmal noctural dyspnea:  no Chest pain/pressure:  no Orthostatic lightheadedness:  very minimal Palpitations:  no Lower extremity edema:  trace Presyncope/syncope:  no Cough:  no    Current Outpatient Medications  Medication Sig Dispense Refill   albuterol  (VENTOLIN  HFA) 108 (90 Base) MCG/ACT inhaler Inhale 2 puffs into the lungs every 6 (six) hours as needed for wheezing or shortness of breath. 18 g 0   aspirin  81 MG chewable tablet Chew 1 tablet (81 mg total) by mouth daily. 30 tablet 3   atorvastatin  (LIPITOR ) 80 MG tablet TAKE 1 TABLET (80 MG TOTAL) BY MOUTH DAILY. 30 tablet 1   doxycycline  (VIBRAMYCIN ) 100 MG capsule Take 1 capsule (100 mg total) by mouth 2 (two) times daily. 20 capsule 0    ezetimibe  (ZETIA ) 10 MG tablet Take 1 tablet (10 mg total) by mouth daily. 90 tablet 3   FARXIGA  10 MG TABS tablet TAKE 1 TABLET (10 MG TOTAL) BY MOUTH DAILY. 30 tablet 1   furosemide  (LASIX ) 40 MG tablet TAKE 1 TABLET (40 MG TOTAL) BY MOUTH AS NEEDED. NEEDS FOLLOW UP APPOINTMENT FOR MORE REFILLS 30 tablet 1   metoprolol  succinate (TOPROL -XL) 25 MG 24 hr tablet TAKE 1 TABLET (25 MG TOTAL) BY MOUTH DAILY. 30 tablet 1   Misc. Devices (DIGITAL GLASS SCALE) MISC UAD to monitor weight daily Dx: I50.9 1 each 0   nitroGLYCERIN  (NITROSTAT ) 0.4 MG SL tablet Place 1 tablet (0.4 mg total) under the tongue every 5 (five) minutes as needed for chest pain or shortness of breath. 25 tablet 0   sacubitril -valsartan  (ENTRESTO ) 49-51 MG Take 1 tablet by mouth 2 (two) times daily. 60 tablet 11   spironolactone  (ALDACTONE ) 25 MG tablet TAKE 1 TABLET (25 MG TOTAL) BY MOUTH DAILY. 90 tablet 3   tamsulosin (FLOMAX) 0.4 MG CAPS capsule Take 0.4 mg by mouth daily after supper.     ticagrelor  (BRILINTA ) 90 MG TABS tablet TAKE ONE TABLET BY MOUTH TWICE DAILY 60 tablet 2   TRULICITY  1.5 MG/0.5ML SOAJ Inject 1 mg into the skin once a week. Tuesday 6 mL 1   No current facility-administered medications for this visit.      PHYSICAL EXAM: There were no vitals filed for this visit. GENERAL: NAD Lungs- *** CARDIAC:  JVP: *** cm  Normal rate with regular rhythm. *** murmur.  Pulses ***. *** edema.  ABDOMEN: Soft, non-tender, non-distended.  EXTREMITIES: Warm and well perfused.  NEUROLOGIC: No obvious FND  DATA REVIEW  ECG: 04/26/23: NSR with anterior Q waves  as per my personal interpretation  ECHO: (5/24): EF 25-30%, WMA consistent with LAD territory infarct, RV ok   CATH: RHC (5/24): RA 7, PA 30/11 (19), PCW 9, CO/CI (Fick) 6.5/2.7, PVR 1.5 WU, PAPi 2.7 LHC (5/24): High grade d LM, ostial LAD (treated with balloon angioplasty), ostial D1 and ostial ramus disease  CMR, 04/28/23: LVEF 33%, RVEF 48%,  nonviable anterior myocardium  ASSESSMENT & PLAN:  Heart Failure with reduced EF Etiology of ZO:XWRUEAVW cardiomyopathy; LHC above; CMR with nonviable myocardium.  NYHA class / AHA Stage:II Volume status & Diuretics: lasix  40mg  prn only; has not required it recently.  Vasodilators:Entresto  24/26mg  BID; limited today by SBP.  Beta-Blocker:Metoprolol  25mg  daily MRA:spironolactone  25mg  daily Cardiometabolic:farxiga  10mg  Devices therapies & Valvulopathies:D/C lifevest. LVEF 45-50% now  Advanced therapies:Not currently indicated.   2. CAD - Hx NSTEMI (2022) s/p PCI/DES to OM2 - Anterior STEMI (5/24) High grade d LM, ostial LAD (treated with PTCA), ostial D1 and ostial ramus disease. Moderate LCx. - Decided against CABG as above. S/p IVUS guided PCI d LM into LCX using cutting balloon and coronary lithotripsy, Megatron stent placed. - No chest pain. - Continue DAPT X 1 year, w/ ASA + Brilinta  - Continue beta blocker + statin.   3. HTN - BP well controlled. - Meds as above   4. DM II - A1c 8.0% - Continue Farxiga . - On Trulicity .   5. Hyperlipidemia - LDL 176, Lp (a) 18.4 - Continue atorva 80 - Repeat LFTs/lipids at follow up.  - May benefit from PCSK-9 inhibitor if LDL not at goal.  Alwin Baars Advanced Heart Failure Mechanical Circulatory Support

## 2024-05-13 ENCOUNTER — Ambulatory Visit (HOSPITAL_COMMUNITY)
Admission: RE | Admit: 2024-05-13 | Discharge: 2024-05-13 | Disposition: A | Discharge status: Home / Self Care | Source: Ambulatory Visit | Attending: Cardiology | Admitting: Cardiology

## 2024-05-13 ENCOUNTER — Encounter (HOSPITAL_COMMUNITY): Payer: Self-pay | Admitting: Cardiology

## 2024-05-13 VITALS — BP 120/84 | HR 77 | Ht 70.0 in | Wt 260.0 lb

## 2024-05-13 DIAGNOSIS — Z7984 Long term (current) use of oral hypoglycemic drugs: Secondary | ICD-10-CM | POA: Diagnosis not present

## 2024-05-13 DIAGNOSIS — Z8673 Personal history of transient ischemic attack (TIA), and cerebral infarction without residual deficits: Secondary | ICD-10-CM | POA: Diagnosis not present

## 2024-05-13 DIAGNOSIS — E785 Hyperlipidemia, unspecified: Secondary | ICD-10-CM | POA: Insufficient documentation

## 2024-05-13 DIAGNOSIS — I10 Essential (primary) hypertension: Secondary | ICD-10-CM

## 2024-05-13 DIAGNOSIS — E1159 Type 2 diabetes mellitus with other circulatory complications: Secondary | ICD-10-CM

## 2024-05-13 DIAGNOSIS — Z7902 Long term (current) use of antithrombotics/antiplatelets: Secondary | ICD-10-CM | POA: Diagnosis not present

## 2024-05-13 DIAGNOSIS — I428 Other cardiomyopathies: Secondary | ICD-10-CM | POA: Diagnosis not present

## 2024-05-13 DIAGNOSIS — Z7722 Contact with and (suspected) exposure to environmental tobacco smoke (acute) (chronic): Secondary | ICD-10-CM | POA: Insufficient documentation

## 2024-05-13 DIAGNOSIS — I110 Hypertensive heart disease with heart failure: Secondary | ICD-10-CM | POA: Diagnosis not present

## 2024-05-13 DIAGNOSIS — E119 Type 2 diabetes mellitus without complications: Secondary | ICD-10-CM | POA: Insufficient documentation

## 2024-05-13 DIAGNOSIS — E1169 Type 2 diabetes mellitus with other specified complication: Secondary | ICD-10-CM | POA: Diagnosis not present

## 2024-05-13 DIAGNOSIS — I5022 Chronic systolic (congestive) heart failure: Secondary | ICD-10-CM | POA: Diagnosis not present

## 2024-05-13 DIAGNOSIS — Z955 Presence of coronary angioplasty implant and graft: Secondary | ICD-10-CM | POA: Diagnosis present

## 2024-05-13 DIAGNOSIS — Z7985 Long-term (current) use of injectable non-insulin antidiabetic drugs: Secondary | ICD-10-CM | POA: Insufficient documentation

## 2024-05-13 DIAGNOSIS — I252 Old myocardial infarction: Secondary | ICD-10-CM | POA: Insufficient documentation

## 2024-05-13 DIAGNOSIS — I152 Hypertension secondary to endocrine disorders: Secondary | ICD-10-CM

## 2024-05-13 DIAGNOSIS — M1711 Unilateral primary osteoarthritis, right knee: Secondary | ICD-10-CM | POA: Insufficient documentation

## 2024-05-13 DIAGNOSIS — E118 Type 2 diabetes mellitus with unspecified complications: Secondary | ICD-10-CM

## 2024-05-13 DIAGNOSIS — I2510 Atherosclerotic heart disease of native coronary artery without angina pectoris: Secondary | ICD-10-CM | POA: Insufficient documentation

## 2024-05-13 LAB — BASIC METABOLIC PANEL WITH GFR
Anion gap: 7 (ref 5–15)
BUN: 11 mg/dL (ref 8–23)
CO2: 20 mmol/L — ABNORMAL LOW (ref 22–32)
Calcium: 8.2 mg/dL — ABNORMAL LOW (ref 8.9–10.3)
Chloride: 110 mmol/L (ref 98–111)
Creatinine, Ser: 0.83 mg/dL (ref 0.61–1.24)
GFR, Estimated: >60 mL/min (ref >60)
Glucose, Bld: 204 mg/dL — ABNORMAL HIGH (ref 70–99)
Potassium: 3.6 mmol/L (ref 3.5–5.1)
Sodium: 137 mmol/L (ref 135–145)

## 2024-05-13 LAB — BRAIN NATRIURETIC PEPTIDE: B Natriuretic Peptide: 131.6 pg/mL — ABNORMAL HIGH (ref 0.0–100.0)

## 2024-05-13 NOTE — Patient Instructions (Addendum)
 Medication Changes:  STOP BRILINTA  FOR DENTAL PROCEDURE   AFTER PROCEDURE- RESTART BRILINTA  WHEN SAFE AND STOP TAKING ASPIRIN    Lab Work:  Labs done today, your results will be available in MyChart, we will contact you for abnormal readings.   Follow-Up in: 3 months as scheduled with Dr. Bruce Caper   At the [facility], you and your health needs are our priority. We have a designated team specialized in the treatment of Heart Failure. This Care Team includes your primary Heart Failure Specialized Cardiologist (physician), Advanced Practice Providers (APPs- Physician Assistants and Nurse Practitioners), and Pharmacist who all work together to provide you with the care you need, when you need it.   You may see any of the following providers on your designated Care Team at your next follow up:  Dr. Jules Oar Dr. Peder Bourdon Dr. Alwin Baars Dr. Judyth Nunnery Nieves Bars, NP Ruddy Corral, [place] [facility] [place], [place] Dennise Fitz, NP [place] Lee, NP Luster Salters, PharmD   Please be sure to bring in all your medications bottles to every appointment.   Need to Contact Us :  If you have any questions or concerns before your next appointment please send us  a message through [place] or call our office at [phone].    TO LEAVE A MESSAGE FOR THE NURSE SELECT OPTION 2, PLEASE LEAVE A MESSAGE INCLUDING: YOUR NAME DATE OF BIRTH CALL BACK NUMBER REASON FOR CALL**this is important as we prioritize the call backs  YOU WILL RECEIVE A CALL BACK THE SAME DAY AS LONG AS YOU CALL BEFORE 4:00 PM

## 2024-05-14 ENCOUNTER — Other Ambulatory Visit: Payer: Self-pay | Admitting: *Deleted

## 2024-05-14 DIAGNOSIS — E785 Hyperlipidemia, unspecified: Secondary | ICD-10-CM

## 2024-05-14 NOTE — Progress Notes (Signed)
 Repeat lipids/liver in 8 weeks.

## 2024-05-19 ENCOUNTER — Encounter (INDEPENDENT_AMBULATORY_CARE_PROVIDER_SITE_OTHER): Payer: Self-pay | Admitting: Primary Care

## 2024-05-19 NOTE — Patient Instructions (Signed)
 Okay

## 2024-05-19 NOTE — Progress Notes (Addendum)
 [facility]  Jacob Gonzales, is a 68 y.o. male  RDW:[id number]  FMW:[id number]  DOB - 10-02-1956  Chief Complaint  Patient presents with   Medical Management of Chronic Issues   Fatigue    Sleeping through the night but when  going out pt becomes tired and has to rest for a couple of hours        Subjective:   Jacob Gonzales is a 68 y.o. male here today for management of prediabetes and hyperlipidemia.  Denies polyuria, polydipsia, polyphasia or vision changes.  Does not check blood sugars at home.  Patient voices concerns lately he has been more fatigued, shortness of breath and needing to take breaks.  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  He does have a history of STV elevated MRI and a it ischemic stroke.  His leg slightly edematous but wears his socks up his legs and the elastic leaves indentions.  Blood pressure is well-controlled.  Will refer patient to cardiology No problems updated.  Comprehensive ROS Pertinent positive and negative noted in HPI   No Known Allergies  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   Arthritis    Bilateral shoulders, Hands, Knees   Bone cancer (HCC) 2016   Brain cancer (HCC) 2016   Diabetes (HCC) 11/17/2015   HTN (hypertension) 11/17/2015   NSTEMI (non-ST elevated myocardial infarction) [facility])     Current Outpatient Medications on File Prior to Visit  Medication Sig Dispense Refill   aspirin  81 MG chewable tablet Chew 1 tablet (81 mg total) by mouth daily. 30 tablet 3   atorvastatin  (LIPITOR ) 80 MG tablet TAKE 1 TABLET (80 MG TOTAL) BY MOUTH DAILY. 30 tablet 1   doxycycline  (VIBRAMYCIN ) 100 MG capsule Take 1 capsule (100 mg total) by mouth 2 (two) times daily. 20 capsule 0   ezetimibe  (ZETIA ) 10 MG tablet Take 1 tablet (10 mg total) by mouth daily. 90 tablet 3   FARXIGA  10 MG TABS tablet TAKE 1  TABLET (10 MG TOTAL) BY MOUTH DAILY. 30 tablet 1   furosemide  (LASIX ) 40 MG tablet TAKE 1 TABLET (40 MG TOTAL) BY MOUTH AS NEEDED. NEEDS FOLLOW UP APPOINTMENT FOR MORE REFILLS 30 tablet 1   metoprolol  succinate (TOPROL -XL) 25 MG 24 hr tablet TAKE 1 TABLET (25 MG TOTAL) BY MOUTH DAILY. 30 tablet 1   Misc. Devices (DIGITAL GLASS SCALE) MISC UAD to monitor weight daily Dx: I50.9 1 each 0   nitroGLYCERIN  (NITROSTAT ) 0.4 MG SL tablet Place 1 tablet (0.4 mg total) under the tongue every 5 (five) minutes as needed for chest pain or shortness of breath. 25 tablet 0   sacubitril -valsartan  (ENTRESTO ) 49-51 MG Take 1 tablet by mouth 2 (two) times daily. 60 tablet 11   spironolactone  (ALDACTONE ) 25 MG tablet TAKE 1 TABLET (25 MG TOTAL) BY MOUTH DAILY. 90 tablet 3   tamsulosin (FLOMAX) 0.4 MG CAPS capsule Take 0.4 mg by mouth daily after supper.     ticagrelor  (BRILINTA ) 90 MG TABS tablet TAKE ONE TABLET BY MOUTH TWICE DAILY 60 tablet 2   TRULICITY  1.5 MG/0.5ML SOAJ Inject 1 mg into the skin once a week. Tuesday (Patient taking differently: Inject 1 mg into the skin once a week. Thursday) 6 mL 1   No current facility-administered medications on file prior to visit.   Health Maintenance  Topic Date Due   Colon  Cancer Screening  Never done   Zoster (Shingles) Vaccine (1 of 2) Never done   COVID-19 Vaccine (1 - 2024-25 season) Never done   DTaP/Tdap/Td vaccine (1 - Tdap) 01/14/2025*   Pneumococcal Vaccine for age over 65 (2 of 2 - PCV) 01/14/2025*   Flu Shot  07/05/2024   Hemoglobin A1C  11/06/2024   Complete foot exam   01/14/2025   Medicare Annual Wellness Visit  02/04/2025   Yearly kidney health urinalysis for diabetes  03/02/2025   Eye exam for diabetics  04/23/2025   Yearly kidney function blood test for diabetes  05/13/2025   Hepatitis C Screening  Completed   HIV Screening  Completed   HPV Vaccine  Aged Out   Meningitis B Vaccine  Aged Out  *Topic was postponed. The date shown is not the  original due date.    Objective:   Vitals:   05/07/24 0858  Weight: 250 lb 6.4 oz (113.6 kg)   BP Readings from Last 3 Encounters:  05/13/24 120/84  02/12/24 120/68  02/05/24 125/71      Physical Exam Vitals reviewed.  Constitutional:      Appearance: He is obese.  HENT:     Head: Normocephalic.     Right Ear: Tympanic membrane and external ear normal.     Left Ear: Tympanic membrane and external ear normal.     Nose: Nose normal.   Eyes:     Extraocular Movements: Extraocular movements intact.    Cardiovascular:     Rate and Rhythm: Normal rate and regular rhythm.  Pulmonary:     Effort: Pulmonary effort is normal.     Breath sounds: Normal breath sounds.  Abdominal:     General: Bowel sounds are normal. There is distension.     Palpations: Abdomen is soft.   Musculoskeletal:        General: Normal range of motion.     Right lower leg: Edema present.     Left lower leg: Edema present.   Skin:    General: Skin is warm and dry.   Neurological:     Mental Status: He is oriented to person, place, and time.   Psychiatric:        Mood and Affect: Mood normal.        Behavior: Behavior normal.        Thought Content: Thought content normal.        Judgment: Judgment normal.    Assessment & Plan  Jacob Gonzales was seen today for medical management of chronic issues and fatigue.  Diagnoses and all orders for this visit:  Type 2 diabetes mellitus without complication, without long-term current use of insulin  (HCC) -     POCT glycosylated hemoglobin (Hb A1C) -     POCT glucose (manual entry) monitor carbohydrates -rice, potatoes, tortillas, [place]a Flour, breads, pasta, sweets, sodas.  Increase exercising to help maintain appropriate weight.   Mixed hyperlipidemia -     Lipid Panel -     Lipoprotein A (LPA) -     Hepatic Function Panel  Chronic systolic heart failure (HCC) Referred to cardiology for  evaluation    Patient have been counseled  extensively about nutrition and exercise. Other issues discussed during this visit include: low cholesterol diet, weight control and daily exercise, foot care, annual eye examinations at Ophthalmology, importance of adherence with medications and regular follow-up. We also discussed long term complications of uncontrolled diabetes and hypertension.   Return in about 3  months (around 08/07/2024).  The patient was given clear instructions to go to ER or return to medical center if symptoms don't improve, worsen or new problems develop. The patient verbalized understanding. The patient was told to call to get lab results if they haven't heard anything in the next week.   This note has been created with Education officer, environmental. Any transcriptional errors are unintentional.   Jacob SHAUNNA Bohr, NP 05/19/2024, 10:51 PM

## 2024-05-20 ENCOUNTER — Ambulatory Visit: Attending: Physician Assistant | Admitting: Physician Assistant

## 2024-05-20 NOTE — Progress Notes (Deleted)
  Cardiology Office Note:  .   Date:  05/20/2024  ID:  Jacob Gonzales, DOB 1956-11-20, MRN [id number] PCP: Marius Siemens, NP  [facility] Providers Cardiologist:  Kyra Phy, MD {  History of Present Illness: .   Jacob Gonzales is a 68 y.o. male with history of ischemic cardiomyopathy with reduced EF, CAD with DES to OM2 2022, anterior STEMI 04/2023 with high-grade disease in LM (denied CABG), hypertension, type 2 diabetes, hyperlipidemia, pulmonary nodules, CVA     CAD NSTEMI with DES to OM2 2022 Late presentation anterior STEMI 04/2023.  Balloon angioplasty to the ostial LAD.  cMRI showing no viable tissue.  Declined CABG. So then DES to LCx with Cutting Balloon and coronary lithotripsy.  Ischemic cardiomyopathy In the setting of STEMI as above.  EF 25 to 30%.  Preserved cardiac output with normal right sided pressures. EF 30 to 35%, normal RV 07/2023 EF 35 to 40%, normal RV.  No significant valvular disease.  05/2024.     Patient has history of anterior STEMI in May 2024 with reduced EF.  cMRI with no viable tissue.  Declined CABG.  Underwent stent placement to his Lcx.  Has history of ischemic cardiomyopathy with reduced EF recently seen by advanced heart failure June 2025 and felt to be well compensated and no current indications for advanced therapies.  Life vest was discontinued.  CAD - NSTEMI 2022 with DES to OM2 - Anterior STEMI 04/2023 with high-grade LM disease, declined CABG.  Moderate disease in LCx status post DES. Per Dr. Lorie Rook and AHF okay to continue Brilinta  monotherapy now.  Ischemic cardiomyopathy with reduced EF - In the setting of anterior STEMI, EF 25 to 30%.  Preserved CO, normal right-sided pressures. - Cardiac MRI with no viable tissue - Most recent echocardiogram 05/2024 showing gradual improvement EF 35 to 40%, normal RV.  No significant valvular disease.  Hypertension  Type 2 diabetes A1c 6.9% 13 days ago.  Hyperlipidemia LDL 60 13 days  ago We will repeat lipid panel and LFTs today  History of secondhand smoke Normal PFTs June 2025   ROS: Denies: Chest pain, shortness of breath, orthopnea, peripheral edema, palpitations, decreased exercise intolerance, fatigue, lightheadedness.   Studies Reviewed: .         Risk Assessment/Calculations:   {Does this patient have ATRIAL FIBRILLATION?:[phone]} No BP recorded.  {Refresh Note OR Click here to enter BP  :1}***       Physical Exam:   VS:  There were no vitals taken for this visit.   Wt Readings from Last 3 Encounters:  05/13/24 260 lb (117.9 kg)  05/07/24 250 lb 6.4 oz (113.6 kg)  02/12/24 256 lb (116.1 kg)    GEN: Well nourished, well developed in no acute distress NECK: No JVD; No carotid bruits CARDIAC: ***RRR, no murmurs, rubs, gallops RESPIRATORY:  Clear to auscultation without rales, wheezing or rhonchi  ABDOMEN: Soft, non-tender, non-distended EXTREMITIES:  No edema; No deformity   ASSESSMENT AND PLAN: .         {Are you ordering a CV Procedure (e.g. stress test, cath, DCCV, TEE, etc)?   Press F2        :[id number]}  Dispo: ***  Signed, Burnetta Cart, PA-C

## 2024-05-21 ENCOUNTER — Encounter: Payer: Self-pay | Admitting: Physician Assistant

## 2024-05-27 ENCOUNTER — Encounter (INDEPENDENT_AMBULATORY_CARE_PROVIDER_SITE_OTHER): Admitting: Podiatry

## 2024-05-27 DIAGNOSIS — Z91199 Patient's noncompliance with other medical treatment and regimen due to unspecified reason: Secondary | ICD-10-CM

## 2024-05-27 NOTE — Progress Notes (Signed)
Patient did not show for scheduled appointment today.

## 2024-06-25 ENCOUNTER — Telehealth (INDEPENDENT_AMBULATORY_CARE_PROVIDER_SITE_OTHER): Payer: Self-pay | Admitting: Primary Care

## 2024-06-25 NOTE — Telephone Encounter (Signed)
 Will forward to provider

## 2024-06-25 NOTE — Telephone Encounter (Signed)
 Pt came in through lobby stating that he was sent to [facility] to take X-Ray. Pt stated that last time he was seen 05/07/24 Per provider wanted to put in an order for pt to get X-Ray. Pt stated he went there to get order but was turned around due to no order being found according to pt. Please advise on moving forward.

## 2024-07-10 ENCOUNTER — Ambulatory Visit (INDEPENDENT_AMBULATORY_CARE_PROVIDER_SITE_OTHER): Admitting: Podiatry

## 2024-07-10 ENCOUNTER — Encounter: Payer: Self-pay | Admitting: Podiatry

## 2024-07-10 DIAGNOSIS — M79675 Pain in left toe(s): Secondary | ICD-10-CM

## 2024-07-10 DIAGNOSIS — B351 Tinea unguium: Secondary | ICD-10-CM

## 2024-07-10 DIAGNOSIS — E1151 Type 2 diabetes mellitus with diabetic peripheral angiopathy without gangrene: Secondary | ICD-10-CM

## 2024-07-10 DIAGNOSIS — M79674 Pain in right toe(s): Secondary | ICD-10-CM

## 2024-07-10 NOTE — Progress Notes (Signed)
This patient returns to my office for at risk foot care.  This patient requires this care by a professional since this patient will be at risk due to having diabetes and coagulation defect.  This patient is unable to cut nails himself since the patient cannot reach his nails.These nails are painful walking and wearing shoes.  This patient presents for at risk foot care today.  General Appearance  Alert, conversant and in no acute stress.  Vascular  Dorsalis pedis and posterior tibial  pulses are weakly palpable  bilaterally.  Capillary return is within normal limits  bilaterally. Temperature is within normal limits  bilaterally.  Neurologic  Senn-Weinstein monofilament wire test within normal limits  bilaterally. Muscle power within normal limits bilaterally.  Nails Thick disfigured discolored nails with subungual debris  from hallux to fifth toes bilaterally. No evidence of bacterial infection or drainage bilaterally.  Orthopedic  No limitations of motion  feet .  No crepitus or effusions noted.  No bony pathology or digital deformities noted.  Skin  normotropic skin with no porokeratosis noted bilaterally.  No signs of infections or ulcers noted.     Onychomycosis  Pain in right toes  Pain in left toes  Consent was obtained for treatment procedures.   Mechanical debridement of nails 1-5  bilaterally performed with a nail nipper.  Filed with dremel without incident.    Return office visit    3 months                  Told patient to return for periodic foot care and evaluation due to potential at risk complications.   Jericka Kadar DPM   

## 2024-07-23 ENCOUNTER — Other Ambulatory Visit (HOSPITAL_COMMUNITY): Payer: Self-pay | Admitting: Physician Assistant

## 2024-07-23 ENCOUNTER — Other Ambulatory Visit (HOSPITAL_COMMUNITY): Payer: Self-pay | Admitting: Cardiology

## 2024-07-31 ENCOUNTER — Telehealth: Payer: Self-pay

## 2024-07-31 NOTE — Progress Notes (Signed)
   07/31/2024  Patient ID: Jacob Gonzales, male   DOB: 20-Oct-1956, 68 y.o.   MRN: [id number]  This patient is appearing on a report for being at risk of failing the adherence measure for identified medications this calendar year.   Medication Adherence Summary (STAR/HEDIS Monitoring): Adherence Category: cholesterol (statin)    Drug Name: Atorvastatin  80 mg  Sold Date:07/23/2024 (per pharmacy) Days' Supply: 30   Drug Name: Farxiga  10 mg  Sold Date:07/23/2024 Days' Supply: 30 - not on [employer] medication adherence report   Trulicity  (not on report) las filled/sold on 05/01/2024 for 84 days supply. This medication was not on the report.     Notes: ? Adherence data pulled from pharmacy claims portal Dr. Annemarie and patient's preferred pharmacy was called as well.  ? Reviewed barriers to adherence: days supply. ? Plan: Will collaborate with provider to facilitate refill needs.  Dorcas Solian, PharmD Clinical Pharmacist Cell: [phone]

## 2024-08-02 ENCOUNTER — Telehealth (HOSPITAL_COMMUNITY): Payer: Self-pay | Admitting: Cardiology

## 2024-08-02 NOTE — Telephone Encounter (Signed)
 Called to confirm/remind patient of their appointment at the [facility] on 08/02/2024.   Appointment:   [x] Confirmed  [] Left mess   [] No answer/No voice mail  [] VM Full/unable to leave message  [] Phone not in service  Patient reminded to bring all medications and/or complete list.  Confirmed patient has transportation. Gave directions, instructed to utilize valet parking.

## 2024-08-05 NOTE — Progress Notes (Incomplete)
 ADVANCED HEART FAILURE CLINIC NOTE  Referring Physician: Celestia Rosaline SQUIBB, NP  Primary Care: Celestia Rosaline SQUIBB, NP Primary Cardiologist: Dr. Wendel  CC: HFreF  HPI: 68 y.o.. 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 His history with us  dates back to 5/24 when he was admitted with an with anterior STEMI. LHC w/ High grade  LM, ostial LAD (treated with balloon angioplasty), ostial D1 and ostial ramus disease. Given severity of disease, surgical revascularization recommended. Echo w/ EF 25-30%, WMA consistent with LAD territory infarct. CMRI showed w/ nonviable anterior myocardium, LVEF 33%, RVEF 48%. RHC with preserved CO. Ultimately decided against CABG and underwent IVUS guided PCI d LM into LCX.   Interval hx:  ***  Current Outpatient Medications  Medication Sig Dispense Refill   aspirin  81 MG chewable tablet Chew 1 tablet (81 mg total) by mouth daily. 30 tablet 3   atorvastatin  (LIPITOR ) 80 MG tablet TAKE 1 TABLET (80 MG TOTAL) BY MOUTH DAILY. 30 tablet 1   BRILINTA  90 MG TABS tablet TAKE ONE TABLET BY MOUTH TWICE DAILY 60 tablet 2   doxycycline  (VIBRAMYCIN ) 100 MG capsule Take 1 capsule (100 mg total) by mouth 2 (two) times daily. 20 capsule 0   ezetimibe  (ZETIA ) 10 MG tablet Take 1 tablet (10 mg total) by mouth daily. 90 tablet 3   FARXIGA  10 MG TABS tablet TAKE 1 TABLET (10 MG TOTAL) BY MOUTH DAILY. 30 tablet 1   furosemide  (LASIX ) 40 MG tablet Take 1 tablet (40 mg total) by mouth as needed. 30 tablet 1   metoprolol  succinate (TOPROL -XL) 25 MG 24 hr tablet TAKE 1 TABLET (25 MG TOTAL) BY MOUTH DAILY. 30 tablet 1   Misc. Devices (DIGITAL GLASS SCALE) MISC UAD to monitor weight daily Dx: I50.9 1 each 0   nitroGLYCERIN  (NITROSTAT ) 0.4 MG SL tablet Place 1 tablet (0.4 mg total) under the tongue every 5 (five) minutes as needed for chest pain or shortness of  breath. 25 tablet 0   sacubitril -valsartan  (ENTRESTO ) 49-51 MG Take 1 tablet by mouth 2 (two) times daily. 60 tablet 11   spironolactone  (ALDACTONE ) 25 MG tablet TAKE 1 TABLET (25 MG TOTAL) BY MOUTH DAILY. 90 tablet 3   tamsulosin (FLOMAX) 0.4 MG CAPS capsule Take 0.4 mg by mouth daily after supper.     TRULICITY  1.5 MG/0.5ML SOAJ Inject 1 mg into the skin once a week. Tuesday (Patient taking differently: Inject 1 mg into the skin once a week. Thursday) 6 mL 1   No current facility-administered medications for this visit.   PHYSICAL EXAM: There were no vitals filed for this visit. GENERAL: NAD Lungs- *** CARDIAC:  JVP: *** cm          Normal rate with regular rhythm. *** murmur.  Pulses ***. *** edema.  ABDOMEN: Soft, non-tender, non-distended.  EXTREMITIES: Warm and well perfused.  NEUROLOGIC: No obvious FND   DATA REVIEW  ECG: 04/26/23: NSR with anterior Q waves  as per my personal interpretation  ECHO: (5/24): EF 25-30%, WMA consistent with LAD territory infarct, RV ok  05/06/24: LVEF 35%-40%.   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  NYHA class / AHA Stage:II Volume status & Diuretics: lasix  40mg  prn only; has not required it recently.  Vasodilators:Entresto  49/51mg  BID; did not take this morning. Will continue at current dose  Beta-Blocker:Metoprolol  25mg  daily; HR 70s. Continue at 25mg  daily.  MRA:spironolactone  25mg  daily Cardiometabolic:farxiga  10mg  Devices therapies & Valvulopathies:D/C lifevest. Improvement in LVEF to 40%.  Advanced therapies:Not currently indicated.   2. CAD - Hx NSTEMI (2022) s/p PCI/DES to  OM2 - Anterior STEMI (5/24) High grade d LM, ostial LAD (treated with PTCA), ostial D1 and ostial ramus disease. Moderate LCx. - Decided against CABG as above. S/p IVUS guided PCI d LM into LCX using cutting balloon and coronary lithotripsy, Megatron stent placed. - Continue beta blocker + statin. - No chest pain - Planning on dental procedure soon. I discussed with Dr. Wendel. Will plan to hold Brilinta  prior to procedure while continuing ASA 81mg  daily. After dental work will transition to brilinta  monotherapy.    3. HTN - Well controlled.  - Repeat BMP/BNP.  - Meds as above   4. DM II - A1c 8.0% - Continue Farxiga . - On Trulicity .   5. Hyperlipidemia - LDL 176, Lp (a) 18.4 - Continue atorva 80 - Repeat LFTs/lipids at follow up.  - May benefit from PCSK-9 inhibitor if LDL not at goal. - Repeat Lipid panel on 05/07/24 w/ LDL of 60.   6. Hx of second hand smoke - Reports significant exposure to smoke over his life time - PFT from 05/06/24 WNL.   I spent *** minutes caring for this patient today including face to face time, ordering and reviewing labs, reviewing records from ***, seeing the patient, documenting in the record, and arranging follow ups.    Denese Mentink Advanced [facility] Failure Mechanical Circulatory Support

## 2024-08-06 ENCOUNTER — Ambulatory Visit (HOSPITAL_COMMUNITY)
Admission: RE | Admit: 2024-08-06 | Discharge: 2024-08-06 | Disposition: A | Discharge status: Home / Self Care | Source: Ambulatory Visit | Attending: Cardiology | Admitting: Cardiology

## 2024-08-06 ENCOUNTER — Encounter (HOSPITAL_COMMUNITY): Payer: Self-pay | Admitting: Cardiology

## 2024-08-06 VITALS — BP 132/80 | HR 78 | Wt 264.4 lb

## 2024-08-06 DIAGNOSIS — E119 Type 2 diabetes mellitus without complications: Secondary | ICD-10-CM | POA: Insufficient documentation

## 2024-08-06 DIAGNOSIS — R918 Other nonspecific abnormal finding of lung field: Secondary | ICD-10-CM | POA: Diagnosis not present

## 2024-08-06 DIAGNOSIS — Z7985 Long-term (current) use of injectable non-insulin antidiabetic drugs: Secondary | ICD-10-CM | POA: Insufficient documentation

## 2024-08-06 DIAGNOSIS — Z7722 Contact with and (suspected) exposure to environmental tobacco smoke (acute) (chronic): Secondary | ICD-10-CM | POA: Diagnosis not present

## 2024-08-06 DIAGNOSIS — M25569 Pain in unspecified knee: Secondary | ICD-10-CM | POA: Insufficient documentation

## 2024-08-06 DIAGNOSIS — I255 Ischemic cardiomyopathy: Secondary | ICD-10-CM | POA: Insufficient documentation

## 2024-08-06 DIAGNOSIS — I110 Hypertensive heart disease with heart failure: Secondary | ICD-10-CM | POA: Insufficient documentation

## 2024-08-06 DIAGNOSIS — Z8673 Personal history of transient ischemic attack (TIA), and cerebral infarction without residual deficits: Secondary | ICD-10-CM | POA: Insufficient documentation

## 2024-08-06 DIAGNOSIS — E1169 Type 2 diabetes mellitus with other specified complication: Secondary | ICD-10-CM | POA: Diagnosis not present

## 2024-08-06 DIAGNOSIS — E785 Hyperlipidemia, unspecified: Secondary | ICD-10-CM | POA: Insufficient documentation

## 2024-08-06 DIAGNOSIS — Z7984 Long term (current) use of oral hypoglycemic drugs: Secondary | ICD-10-CM | POA: Insufficient documentation

## 2024-08-06 DIAGNOSIS — Z7982 Long term (current) use of aspirin: Secondary | ICD-10-CM | POA: Diagnosis not present

## 2024-08-06 DIAGNOSIS — I2510 Atherosclerotic heart disease of native coronary artery without angina pectoris: Secondary | ICD-10-CM | POA: Insufficient documentation

## 2024-08-06 DIAGNOSIS — Z79899 Other long term (current) drug therapy: Secondary | ICD-10-CM | POA: Insufficient documentation

## 2024-08-06 DIAGNOSIS — M1710 Unilateral primary osteoarthritis, unspecified knee: Secondary | ICD-10-CM | POA: Diagnosis not present

## 2024-08-06 DIAGNOSIS — Z955 Presence of coronary angioplasty implant and graft: Secondary | ICD-10-CM | POA: Diagnosis not present

## 2024-08-06 DIAGNOSIS — I5022 Chronic systolic (congestive) heart failure: Secondary | ICD-10-CM | POA: Diagnosis not present

## 2024-08-06 DIAGNOSIS — Z7902 Long term (current) use of antithrombotics/antiplatelets: Secondary | ICD-10-CM | POA: Diagnosis not present

## 2024-08-06 DIAGNOSIS — I252 Old myocardial infarction: Secondary | ICD-10-CM | POA: Diagnosis not present

## 2024-08-06 DIAGNOSIS — E118 Type 2 diabetes mellitus with unspecified complications: Secondary | ICD-10-CM | POA: Diagnosis not present

## 2024-08-06 DIAGNOSIS — E669 Obesity, unspecified: Secondary | ICD-10-CM | POA: Insufficient documentation

## 2024-08-06 DIAGNOSIS — Z6838 Body mass index (BMI) 38.0-38.9, adult: Secondary | ICD-10-CM

## 2024-08-06 LAB — BASIC METABOLIC PANEL WITH GFR
Anion gap: 10 (ref 5–15)
BUN: 14 mg/dL (ref 8–23)
CO2: 23 mmol/L (ref 22–32)
Calcium: 8.4 mg/dL — ABNORMAL LOW (ref 8.9–10.3)
Chloride: 104 mmol/L (ref 98–111)
Creatinine, Ser: 0.89 mg/dL (ref 0.61–1.24)
GFR, Estimated: >60 mL/min (ref >60)
Glucose, Bld: 122 mg/dL — ABNORMAL HIGH (ref 70–99)
Potassium: 4 mmol/L (ref 3.5–5.1)
Sodium: 137 mmol/L (ref 135–145)

## 2024-08-06 LAB — BRAIN NATRIURETIC PEPTIDE: B Natriuretic Peptide: 84.9 pg/mL (ref 0.0–100.0)

## 2024-08-06 MED ORDER — SACUBITRIL-VALSARTAN 97-103 MG PO TABS: 1.0000 | ORAL_TABLET | Freq: Two times a day (BID) | ORAL | 3 refills | Status: DC

## 2024-08-06 NOTE — Patient Instructions (Signed)
 Medication Changes:  INCREASE ENTRESTO  TO 97/103MG  TWICE DAILY   Lab Work:  Labs done today, your results will be available in MyChart, we will contact you for abnormal readings.  Referrals:  YOU HAVE BEEN REFERRED TO ORTHO FOR KNEE PAIN THEY WILL REACH OUT TO YOU OR CALL TO ARRANGE THIS. PLEASE CALL US  WITH ANY CONCERNS   YOU HAVE BEEN REFERRED TO PHARMACY CLINIC FOR GLP-1  THEY WILL REACH OUT TO YOU OR CALL TO ARRANGE THIS. PLEASE CALL US  WITH ANY CONCERNS   Follow-Up in: 3 months as scheduled with APP   At the [facility], you and your health needs are our priority. We have a designated team specialized in the treatment of Heart Failure. This Care Team includes your primary Heart Failure Specialized Cardiologist (physician), Advanced Practice Providers (APPs- Physician Assistants and Nurse Practitioners), and Pharmacist who all work together to provide you with the care you need, when you need it.   You may see any of the following providers on your designated Care Team at your next follow up:  Dr. Toribio Fuel Dr. Ezra Shuck Dr. Ria Commander Dr. Odis Brownie Greig Mosses, NP Caffie Shed, [place] [facility] [place], [place] Beckey Coe, NP [place] Lee, NP Tinnie Redman, PharmD   Please be sure to bring in all your medications bottles to every appointment.   Need to Contact Us :  If you have any questions or concerns before your next appointment please send us  a message through Dora or call our office at [phone].    TO LEAVE A MESSAGE FOR THE NURSE SELECT OPTION 2, PLEASE LEAVE A MESSAGE INCLUDING: YOUR NAME DATE OF BIRTH CALL BACK NUMBER REASON FOR CALL**this is important as we prioritize the call backs  YOU WILL RECEIVE A CALL BACK THE SAME DAY AS LONG AS YOU CALL BEFORE 4:00 PM

## 2024-08-12 ENCOUNTER — Encounter (HOSPITAL_COMMUNITY): Admitting: Cardiology

## 2024-08-12 ENCOUNTER — Other Ambulatory Visit (INDEPENDENT_AMBULATORY_CARE_PROVIDER_SITE_OTHER): Payer: Self-pay | Admitting: Primary Care

## 2024-08-13 ENCOUNTER — Ambulatory Visit (INDEPENDENT_AMBULATORY_CARE_PROVIDER_SITE_OTHER): Admitting: Primary Care

## 2024-08-14 ENCOUNTER — Telehealth: Payer: Self-pay

## 2024-08-14 NOTE — Progress Notes (Signed)
   08/14/2024  Patient ID: Jacob Gonzales, male   DOB: [birth year], 68 y.o.   MRN: [id number]  This patient is appearing on a report for being at risk of failing the adherence measure for identified medications this calendar year.   Medication Adherence Summary (STAR/HEDIS Monitoring): Adherence Category: cholesterol (statin)    Drug Name: Atorvastatin  80 mg  Sold Date:07/23/2024 (per pharmacy) Days' Supply: 30   Drug Name: Farxiga  10 mg  Sold Date:07/23/2024 Days' Supply: 30 - not on [employer] medication adherence report   Trulicity  (not on report) last filled/sold on 08/12/2024 for 84 days supply. This medication was not on the report.     Notes: ? Adherence data pulled from pharmacy claims portal Dr. Annemarie. ? Reviewed barriers to adherence: days supply. ? Plan: Medication is not due yet based on last fill. No days supply conversion.  Dorcas Solian, PharmD Clinical Pharmacist Cell: [phone]

## 2024-08-20 ENCOUNTER — Other Ambulatory Visit (INDEPENDENT_AMBULATORY_CARE_PROVIDER_SITE_OTHER)

## 2024-08-20 ENCOUNTER — Ambulatory Visit (INDEPENDENT_AMBULATORY_CARE_PROVIDER_SITE_OTHER): Admitting: Orthopaedic Surgery

## 2024-08-20 VITALS — Ht 68.0 in | Wt 264.2 lb

## 2024-08-20 DIAGNOSIS — M1711 Unilateral primary osteoarthritis, right knee: Secondary | ICD-10-CM

## 2024-08-20 DIAGNOSIS — M1712 Unilateral primary osteoarthritis, left knee: Secondary | ICD-10-CM

## 2024-08-20 DIAGNOSIS — Z6841 Body Mass Index (BMI) 40.0 and over, adult: Secondary | ICD-10-CM

## 2024-08-20 NOTE — Progress Notes (Signed)
 Office Visit Note   Patient: Jacob Gonzales           Date of Birth: March 13, 1956           MRN: [id number] Visit Date: 08/20/2024              Requested by: Gardenia Led, DO [address] [place],  [place] [address] PCP: Celestia Rosaline SQUIBB, NP   Assessment & Plan: Visit Diagnoses:  1. Primary osteoarthritis of right knee   2. Primary osteoarthritis of left knee   3. Body mass index 40.0-44.9, adult (HCC)     Plan: History of Present Illness Jacob Gonzales is a 68 year old male with heart failure and diabetes who presents with worsening bilateral knee pain. He is accompanied by his son.  He experiences severe pain in his knees, especially the right knee, which prevents him from getting out of bed. A cortisone injection in the right knee a year ago provided relief for two months. His medical history includes heart failure, a stroke in 2016, and a heart attack last year.  Exams of bilateral knees are unchanged from prior visit.  Results LABS A1c: 6.9  RADIOLOGY Bilateral knee X-ray: Severe osteoarthritis with varus deformity  Assessment and Plan Bilateral primary osteoarthritis of knee Chronic bilateral knee pain, right worse than left. X-rays show advanced osteoarthritis with bone-on-bone contact and varus deformity. Weight loss necessary for knee replacement eligibility due to cardiovascular and respiratory risks. Explained knee replacement procedure, recovery, and rehabilitation commitment. - Administer bilateral knee cortisone injections for temporary pain relief. - Encourage weight loss to qualify for knee replacement surgery. - Refer to weight management specialist for consultation on October 8th regarding Ozempic for weight loss. - Provide educational handout on knee replacement surgery.  Follow-Up Instructions: No follow-ups on file.   Orders:  Orders Placed This Encounter  Procedures   XR KNEE 3 VIEW RIGHT   XR KNEE 3 VIEW LEFT   No orders of the defined  types were placed in this encounter.     Procedures: No procedures performed   Clinical Data: No additional findings.   Subjective: Chief Complaint  Patient presents with   Right Knee - Pain   Left Knee - Pain    HPI  Review of Systems  Constitutional: Negative.   HENT: Negative.    Eyes: Negative.   Respiratory: Negative.    Cardiovascular: Negative.   Gastrointestinal: Negative.   Endocrine: Negative.   Genitourinary: Negative.   Skin: Negative.   Allergic/Immunologic: Negative.   Neurological: Negative.   Hematological: Negative.   Psychiatric/Behavioral: Negative.    All other systems reviewed and are negative.    Objective: Vital Signs: Ht 5' 8 (1.727 m)   Wt 264 lb 3.2 oz (119.8 kg)   BMI 40.17 kg/m   Physical Exam Vitals and nursing note reviewed.  Constitutional:      Appearance: He is well-developed.  HENT:     Head: Normocephalic and atraumatic.  Eyes:     Pupils: Pupils are equal, round, and reactive to light.  Pulmonary:     Effort: Pulmonary effort is normal.  Abdominal:     Palpations: Abdomen is soft.  Musculoskeletal:        General: Normal range of motion.     Cervical back: Neck supple.  Skin:    General: Skin is warm.  Neurological:     Mental Status: He is alert and oriented to person, place, and time.  Psychiatric:  Behavior: Behavior normal.        Thought Content: Thought content normal.        Judgment: Judgment normal.     Ortho Exam  Specialty Comments:  No specialty comments available.  Imaging: XR KNEE 3 VIEW RIGHT Result Date: 08/20/2024 X-rays demonstrate severe tricompartmental osteoarthritis.  Bone-on-bone joint space narrowing.  Kellgren-Lawrence stage IV  XR KNEE 3 VIEW LEFT Result Date: 08/20/2024 X-rays of the left knee show advanced tricompartmental osteoarthritis.  Bone-on-bone joint space narrowing.  Kellgren-Lawrence stage IV    PMFS History: Patient Active Problem List   Diagnosis  Date Noted   Knee pain 08/06/2024   Anticoagulant long-term use 12/04/2023   Pain due to onychomycosis of toenails of both feet 12/04/2023   Chronic systolic heart failure (HCC) 04/26/2023   Acute systolic heart failure (HCC) 04/13/2023   STEMI (ST elevation myocardial infarction) (HCC) 04/11/2023   Chest pain 01/15/2021   Pulmonary nodules 01/15/2021   NSTEMI (non-ST elevated myocardial infarction) [facility])    Elevated troponin    Lymphadenopathy    Other chest pain    Syncope 03/03/2019   Acute arterial ischemic stroke, vertebrobasilar, 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   Diabetes (HCC) 11/17/2015   HLD (hyperlipidemia) 11/17/2015   HTN (hypertension) 11/17/2015   Non compliance w medication regimen 11/17/2015   Cerebral infarction (HCC) 07/11/2015   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   Arthritis    Bilateral shoulders, Hands, Knees   Bone cancer (HCC) 2016   Brain cancer (HCC) 2016   Diabetes (HCC) 11/17/2015   HTN (hypertension) 11/17/2015   NSTEMI (non-ST elevated myocardial infarction) (HCC)     Family History  Problem Relation Age of Onset   Heart attack Mother    Heart attack Father    Diabetes Mellitus II Father    Kidney failure Father    Uterine cancer Sister    Heart attack Brother    Heart attack Maternal Aunt    Heart attack Maternal Uncle    Heart attack Paternal Aunt    Heart attack Paternal Uncle    Stroke Neg Hx     Past Surgical History:  Procedure Laterality Date   CORONARY BALLOON ANGIOPLASTY N/A 04/11/2023   Procedure: CORONARY BALLOON ANGIOPLASTY;  Surgeon: Wendel Lurena POUR, MD;  Location: [facility] INVASIVE CV LAB;  Service: Cardiovascular;  Laterality: N/A;   CORONARY LITHOTRIPSY N/A 04/14/2023   Procedure: CORONARY LITHOTRIPSY;  Surgeon: Wendel Lurena POUR, MD;  Location: [facility] INVASIVE CV LAB;  Service: Cardiovascular;  Laterality: N/A;   CORONARY STENT INTERVENTION N/A 01/15/2021    Procedure: CORONARY STENT INTERVENTION;  Surgeon: Dann Candyce RAMAN, MD;  Location[facility] INVASIVE CV LAB;  Service: Cardiovascular;  Laterality: N/A;   CORONARY STENT INTERVENTION N/A 04/14/2023   Procedure: CORONARY STENT INTERVENTION;  Surgeon: Wendel Lurena POUR, MD;  Location: [facility] INVASIVE CV LAB;  Service: Cardiovascular;  Laterality: N/A;   CORONARY ULTRASOUND/IVUS N/A 01/15/2021   Procedure: Intravascular Ultrasound/IVUS;  Surgeon: Dann Candyce RAMAN, MD;  [facility] CV LAB;  Service: Cardiovascular;  Laterality: N/A;   CORONARY ULTRASOUND/IVUS N/A 04/14/2023   Procedure: Coronary Ultrasound/IVUS;  Surgeon: Wendel Lurena POUR, MD;  Location: [facility] INVASIVE CV LAB;  Service: Cardiovascular;  Laterality: N/A;   CORONARY/GRAFT ACUTE MI REVASCULARIZATION N/A 04/11/2023   Procedure: Coronary/Graft Acute MI Revascularization;  Surgeon: Wendel Lurena POUR, MD;  Location: [facility] INVASIVE CV LAB;  Service: Cardiovascular;  Laterality: N/A;   LEFT HEART CATH  AND CORONARY ANGIOGRAPHY N/A 01/15/2021   Procedure: LEFT HEART CATH AND CORONARY ANGIOGRAPHY;  Surgeon: Dann Candyce RAMAN, MD;  Location: [facility] INVASIVE CV LAB;  Service: Cardiovascular;  Laterality: N/A;   LEFT HEART CATH AND CORONARY ANGIOGRAPHY N/A 04/11/2023   Procedure: LEFT HEART CATH AND CORONARY ANGIOGRAPHY;  Surgeon: Wendel Lurena POUR, MD;  Location: [facility] INVASIVE CV LAB;  Service: Cardiovascular;  Laterality: N/A;   NO PAST SURGERIES     RIGHT HEART CATH N/A 04/13/2023   Procedure: RIGHT HEART CATH;  Surgeon: Cherrie Toribio SAUNDERS, MD;  Location: [facility] INVASIVE CV LAB;  Service: Cardiovascular;  Laterality: N/A;   Social History   Occupational History   Not on file  Tobacco Use   Smoking status: Never   Smokeless tobacco: Never  Vaping Use   Vaping status: Never Used  Substance and Sexual Activity   Alcohol use: Yes   Drug use: No   Sexual activity: Not on file

## 2024-09-11 ENCOUNTER — Encounter: Payer: Self-pay | Admitting: Pharmacist

## 2024-09-11 ENCOUNTER — Telehealth: Payer: Self-pay | Admitting: Pharmacist

## 2024-09-11 ENCOUNTER — Other Ambulatory Visit (HOSPITAL_COMMUNITY): Payer: Self-pay

## 2024-09-11 ENCOUNTER — Ambulatory Visit: Attending: Cardiology | Admitting: Pharmacist

## 2024-09-11 NOTE — Telephone Encounter (Signed)
 Pharmacy Patient Advocate Encounter  Insurance verification completed.   The patient is insured through [employer] claim for [employer]. Currently a quantity of 2 ML is a 28 day supply and the co-pay is $0 . The current 28 day co-pay is, $0.  No PA needed at this time.  This test claim was processed through [facility]- copay amounts may vary at other pharmacies due to pharmacy/plan contracts, or as the patient moves through the different stages of their insurance plan.

## 2024-09-11 NOTE — Progress Notes (Signed)
 Patient ID: Jacob Gonzales                 DOB: 1956/01/26                    MRN: [id number]     HPI: Jacob Gonzales is a 68 y.o. male patient referred to pharmacy clinic by Dr.Sabharwal to initiate GLP1-RA therapy. PMH is significant for T2DM, HLD, HTN,CAD, hx of STEMI, HF , and obesity. Most recent BMI 40.18 kg/m .  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 - willing to implement   Family History:  Relation Problem Comments  Mother (Deceased) Heart attack     Father (Deceased) Diabetes Mellitus II   Heart attack   Kidney failure     Sister Uterine cancer     Brother Heart attack     Maternal Aunt (Deceased) Heart attack     Maternal Uncle (Deceased) Heart attack     Paternal Aunt (Deceased) Heart attack     Paternal Uncle (Deceased) Heart attack     Maternal Grandmother (Deceased)   Maternal Grandfather (Deceased)   Paternal Grandmother (Deceased)   Paternal Grandfather (Deceased)     Social History:  Alcohol: none  Smoking: none   Labs: Lab Results  Component Value Date   HGBA1C 6.9 05/07/2024    Wt Readings from Last 1 Encounters:  08/20/24 264 lb 3.2 oz (119.8 kg)    BP Readings from Last 1 Encounters:  08/06/24 132/80   Pulse Readings from Last 1 Encounters:  08/06/24 78       Component Value Date/Time   CHOL 118 05/07/2024 1037   TRIG 97 05/07/2024 1037   HDL 40 05/07/2024 1037   CHOLHDL 3.0 05/07/2024 1037   CHOLHDL 7.8 04/11/2023 1650   VLDL 22 04/11/2023 1650   LDLCALC 60 05/07/2024 1037    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   Arthritis    Bilateral  shoulders, Hands, Knees   Bone cancer (HCC) 2016   Brain cancer (HCC) 2016   Diabetes (HCC) 11/17/2015   HTN (hypertension) 11/17/2015   NSTEMI (non-ST elevated myocardial infarction) (HCC)     Current Outpatient Medications on File Prior to Visit  Medication Sig Dispense Refill   aspirin  81 MG chewable tablet Chew 1 tablet (81 mg total) by mouth daily. 30 tablet 3   atorvastatin  (LIPITOR ) 80 MG tablet TAKE 1 TABLET (80 MG TOTAL) BY MOUTH DAILY. 30 tablet 1   BRILINTA  90 MG TABS tablet TAKE ONE TABLET BY MOUTH TWICE DAILY 60 tablet 2   ezetimibe  (ZETIA ) 10 MG tablet Take 1 tablet (10 mg total) by mouth daily. 90 tablet 3   FARXIGA  10 MG TABS tablet TAKE 1 TABLET (10 MG TOTAL) BY MOUTH DAILY. 30 tablet 1   furosemide  (LASIX ) 40 MG tablet Take 1 tablet (40 mg total) by mouth as needed. 30 tablet 1   metoprolol  succinate (TOPROL -XL) 25 MG 24 hr tablet TAKE 1 TABLET (25 MG TOTAL) BY MOUTH DAILY. 30 tablet 1   Misc. Devices (DIGITAL GLASS SCALE) MISC UAD to  monitor weight daily Dx: I50.9 1 each 0   nitroGLYCERIN  (NITROSTAT ) 0.4 MG SL tablet Place 1 tablet (0.4 mg total) under the tongue every 5 (five) minutes as needed for chest pain or shortness of breath. 25 tablet 0   sacubitril -valsartan  (ENTRESTO ) 97-103 MG Take 1 tablet by mouth 2 (two) times daily. 60 tablet 3   spironolactone  (ALDACTONE ) 25 MG tablet TAKE 1 TABLET (25 MG TOTAL) BY MOUTH DAILY. 90 tablet 3   tamsulosin (FLOMAX) 0.4 MG CAPS capsule Take 0.4 mg by mouth daily after supper.     TRULICITY  1.5 MG/0.5ML SOAJ INJECT 1 MG INTO THE SKIN ONCE A WEEK. TUESDAY 6 mL 1   No current facility-administered medications on file prior to visit.    No Known Allergies   Assessment/Plan:  1. Weight loss -   Problem  Morbid Obesity (Hcc)   Baseline weight and BMI: 256 lbs 36.73 kg/m  Current weight and BMI: 264 lbs 40.18 kg/m  Current meds that affect weight: furosemide   Goal weight: 200 lbs     Morbid obesity (HCC) Patient  has not met goal of at least 5% of body weight loss with comprehensive lifestyle modifications and Trulicity   in the past 3-6 months.will switch GLP1 to weight effective GLP1 to get weight benefit and better BG control . Confirmed patient has no personal or family history of medullary thyroid  carcinoma (MTC) or Multiple Endocrine Neoplasia syndrome type 2 (MEN 2). Injection technique reviewed at today's visit.   Advised patient on common side effects including nausea, diarrhea, dyspepsia, decreased appetite, and fatigue. Counseled patient on reducing meal size and how to titrate medication to minimize side effects. Counseled patient to call if intolerable side effects or if experiencing dehydration, abdominal pain, or dizziness. Along with pharmacotherapy, the patient will follow dietary modifications and aim for at least 150 minutes of moderate-intensity exercise per week, plus resistance training twice a week (as recommended by the American Heart Association). This resistance training--such as weightlifting, bodyweight exercises, or using resistance bands, adapted to the patient's ability--will help prevent muscle loss.  Just received 3 months supply of Trulicity  patient would like to use that up will call back mid Nov to switch Trulicity  to Mounjaro 5 mg once a week     Robbi Blanch, Booth.D Bessie Elspeth BIRCH. [facility] [address], [place], [place] [address] Phone: [phone]; Fax: [phone]

## 2024-09-11 NOTE — Assessment & Plan Note (Signed)
 Patient has not met goal of at least 5% of body weight loss with comprehensive lifestyle modifications and Trulicity   in the past 3-6 months.will switch GLP1 to weight effective GLP1 to get weight benefit and better BG control . Confirmed patient has no personal or family history of medullary thyroid  carcinoma (MTC) or Multiple Endocrine Neoplasia syndrome type 2 (MEN 2). Injection technique reviewed at today's visit.   Advised patient on common side effects including nausea, diarrhea, dyspepsia, decreased appetite, and fatigue. Counseled patient on reducing meal size and how to titrate medication to minimize side effects. Counseled patient to call if intolerable side effects or if experiencing dehydration, abdominal pain, or dizziness. Along with pharmacotherapy, the patient will follow dietary modifications and aim for at least 150 minutes of moderate-intensity exercise per week, plus resistance training twice a week (as recommended by the American Heart Association). This resistance training--such as weightlifting, bodyweight exercises, or using resistance bands, adapted to the patient's ability--will help prevent muscle loss.  Just received 3 months supply of Trulicity  patient would like to use that up will call back mid Nov to switch Trulicity  to Mounjaro 5 mg once a week

## 2024-09-18 ENCOUNTER — Other Ambulatory Visit: Payer: Self-pay | Admitting: Pharmacist

## 2024-09-18 MED ORDER — ATORVASTATIN CALCIUM 80 MG PO TABS: 80.0000 mg | ORAL_TABLET | Freq: Every day | ORAL | 1 refills | Status: AC

## 2024-09-18 NOTE — Progress Notes (Signed)
 Pharmacy Quality Measure Review  This patient is appearing on a report for being at risk of failing the adherence measure for cholesterol (statin) medications this calendar year.   Medication: atorvastatin  Last fill date: 09/04/2024 for 30 day supply  Insurance report was not up to date. No action needed at this time.  Reminder set for 10/05/2024 for next month's fills.   Herlene Fleeta Morris, PharmD, JAQUELINE, CPP Clinical Pharmacist [facility] [phone]

## 2024-09-23 DIAGNOSIS — H40013 Open angle with borderline findings, low risk, bilateral: Secondary | ICD-10-CM | POA: Diagnosis not present

## 2024-10-07 ENCOUNTER — Other Ambulatory Visit: Payer: Self-pay | Admitting: Pharmacist

## 2024-10-07 ENCOUNTER — Encounter: Payer: Self-pay | Admitting: Radiology

## 2024-10-07 NOTE — Progress Notes (Signed)
 Pharmacy Quality Measure Review  This patient is appearing on a report for being at risk of failing the adherence measure for cholesterol (statin) medications this calendar year.   Medication: atorvastatin  Last fill date: 09/04/2024 for 30 day supply  Collaborated with [facility]. Authorized refill of this plus several other rxns. Per [facility], they will call the patient when ready to pick-up.  Herlene Fleeta Morris, PharmD, JAQUELINE, CPP Clinical Pharmacist [facility] [phone]

## 2024-10-09 ENCOUNTER — Other Ambulatory Visit (HOSPITAL_COMMUNITY): Payer: Self-pay

## 2024-10-09 MED ORDER — METOPROLOL SUCCINATE ER 25 MG PO TB24: 25.0000 mg | ORAL_TABLET | Freq: Every day | ORAL | 1 refills | Status: DC

## 2024-10-09 MED ORDER — FUROSEMIDE 40 MG PO TABS: 40.0000 mg | ORAL_TABLET | ORAL | 1 refills | Status: DC | PRN

## 2024-10-09 MED ORDER — FARXIGA 10 MG PO TABS: 10.0000 mg | ORAL_TABLET | Freq: Every day | ORAL | 1 refills | Status: DC

## 2024-10-28 ENCOUNTER — Telehealth: Payer: Self-pay | Admitting: Pharmacist

## 2024-10-28 MED ORDER — MOUNJARO 2.5 MG/0.5ML ~~LOC~~ SOAJ: 2.5000 mg | SUBCUTANEOUS | 1 refills | Status: DC

## 2024-10-28 NOTE — Telephone Encounter (Signed)
 Spoke to pt, reports he has one more pen for Trulicity  1.5 left which he will use tomorrow. Ready to start Mounjaro  from next week. Mounjaro  2.5 mg prescription sent to pharmacy.

## 2024-11-04 ENCOUNTER — Telehealth (HOSPITAL_COMMUNITY): Payer: Self-pay

## 2024-11-04 NOTE — Telephone Encounter (Signed)
 Called to confirm/remind patient of their appointment at the [facility] on 11/05/24.   Appointment:   [x] Confirmed  [] Left mess   [] No answer/No voice mail  [] VM Full/unable to leave message  [] Phone not in service  Patient reminded to bring all medications and/or complete list.  Confirmed patient has transportation. Gave directions, instructed to utilize valet parking.

## 2024-11-04 NOTE — Progress Notes (Signed)
 [facility] NOTE  Referring Physician: Celestia Rosaline SQUIBB, NP  Primary Care: Celestia Rosaline SQUIBB, NP Primary Cardiologist: Dr. Wendel  CC: HFrEF  HPI: 68 y.o.. 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   His history with us  dates back to 5/24 when he was admitted with an with anterior STEMI. LHC w/ High grade  LM, ostial LAD (treated with balloon angioplasty), ostial D1 and ostial ramus disease. Given severity of disease, surgical revascularization recommended. Echo w/ EF 25-30%, WMA consistent with LAD territory infarct. CMRI showed w/ nonviable anterior myocardium, LVEF 33%, RVEF 48%. RHC with preserved CO. Ultimately decided against CABG and underwent IVUS guided PCI d LM into LCX. Since that time he has seen steady improvement in functional status with addition of GDMT.   Interval hx:  - Reports that he feels very limited due to knee pain. 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 Due to his he has become increasingly fatigued and tired.  - Doing very well from a HFrEF standpoint; minimal dyspnea; no edema, palpitations or lightheadedness.   Current Outpatient Medications  Medication Sig Dispense Refill   aspirin  81 MG chewable tablet Chew 1 tablet (81 mg total) by mouth daily. 30 tablet 3   atorvastatin  (LIPITOR ) 80 MG tablet Take 1 tablet (80 mg total) by mouth daily. 90 tablet 1   BRILINTA  90 MG TABS tablet TAKE ONE TABLET BY MOUTH TWICE DAILY 60 tablet 2   ezetimibe  (ZETIA ) 10 MG tablet Take 1 tablet (10 mg total) by mouth daily. 90 tablet 3   FARXIGA  10 MG TABS tablet Take 1 tablet (10 mg total) by mouth daily. 30 tablet 1   furosemide  (LASIX ) 40 MG tablet Take 1 tablet (40 mg total) by mouth as needed. 30 tablet 1   metoprolol  succinate (TOPROL -XL) 25 MG 24 hr tablet Take 1 tablet (25 mg total) by mouth daily. 30  tablet 1   Misc. Devices (DIGITAL GLASS SCALE) MISC UAD to monitor weight daily Dx: I50.9 1 each 0   nitroGLYCERIN  (NITROSTAT ) 0.4 MG SL tablet Place 1 tablet (0.4 mg total) under the tongue every 5 (five) minutes as needed for chest pain or shortness of breath. 25 tablet 0   sacubitril -valsartan  (ENTRESTO ) 97-103 MG Take 1 tablet by mouth 2 (two) times daily. 60 tablet 3   spironolactone  (ALDACTONE ) 25 MG tablet TAKE 1 TABLET (25 MG TOTAL) BY MOUTH DAILY. 90 tablet 3   tamsulosin (FLOMAX) 0.4 MG CAPS capsule Take 0.4 mg by mouth daily after supper.     tirzepatide  (MOUNJARO ) 2.5 MG/0.5ML Pen Inject 2.5 mg into the skin once a week. 2 mL 1   No current facility-administered medications for this visit.   PHYSICAL EXAM: There were no vitals filed for this visit.  GENERAL: NAD Lungs- CTA CARDIAC:  JVP: 7 cm          Normal rate with regular rhythm. no murmur.  Pulses 2+. no edema.  ABDOMEN: Soft, non-tender, non-distended.  EXTREMITIES: Warm and well perfused.  NEUROLOGIC: No obvious FND   DATA REVIEW  ECG: 04/26/23: NSR with anterior Q waves  as per my personal interpretation  ECHO: (5/24): EF 25-30%, WMA consistent with LAD territory infarct, RV ok  05/06/24: LVEF 35%-40%.   CATH: RHC (5/24): RA 7, PA 30/11 (19), PCW 9, CO/CI (Fick) 6.5/2.7, PVR  1.5 WU, PAPi 2.7 LHC (5/24): High grade d LM, ostial LAD (treated with balloon angioplasty), ostial D1 and ostial ramus disease  CMR, 04/28/23: LVEF 33%, RVEF 48%, nonviable anterior myocardium  Labs reviewed: sCr 0.89 today, K 4, BNP 84.   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  NYHA class / AHA Stage:II Volume status & Diuretics: lasix  40mg  PRN Vasodilators:Increase Entresto  to 97/103 BID.  See BMP results above.  Beta-Blocker:Metoprolol  25mg  daily; HR 70s. Continue at 25mg  daily.  MRA:spironolactone  25mg  daily Cardiometabolic:farxiga   10mg  Devices therapies & Valvulopathies:D/C lifevest. Improvement in LVEF to 40%.  Advanced therapies:Not currently indicated.   2. CAD - Hx NSTEMI (2022) s/p PCI/DES to OM2 - Anterior STEMI (5/24) High grade d LM, ostial LAD (treated with PTCA), ostial D1 and ostial ramus disease. Moderate LCx. - Decided against CABG as above. S/p IVUS guided PCI d LM into LCX using cutting balloon and coronary lithotripsy, Megatron stent placed. - Continue beta blocker + statin. - No chest pain - Planning on dental procedure soon. I discussed with Dr. Wendel. Will plan to hold Brilinta  prior to procedure while continuing ASA 81mg  daily. After dental work will transition to brilinta  monotherapy.    3. HTN - Well controlled.  - Repeat BMP/BNP today.  - Meds as above   4. DM II - A1c 8.0% - Continue Farxiga . - On Trulicity .   5. Hyperlipidemia - LDL 176, Lp (a) 18.4 - Continue atorva 80 - LDL 60; started on Zetia  10mg  daily.   6. Hx of second hand smoke - Reports significant exposure to smoke over his life time - PFT from 05/06/24 WNL.   7. Knee osteoarthritis  - Reports severe knee pain; will refer to ortho.   8. Obesity  - Unable to lose weight with dietary changes - Discussed starting GLP1RA; he is interested. Hx of T2DM as noted above. Will refer to pharmacy clinic.   I spent 45 minutes caring for this patient today including face to face time, ordering and reviewing labs,, seeing the patient, documenting in the record, and arranging follow ups.    Aditya Sabharwal [facility] Mechanical Circulatory Support

## 2024-11-05 ENCOUNTER — Encounter (HOSPITAL_COMMUNITY): Payer: Self-pay

## 2024-11-05 ENCOUNTER — Ambulatory Visit (HOSPITAL_COMMUNITY)
Admission: RE | Admit: 2024-11-05 | Discharge: 2024-11-05 | Disposition: A | Source: Ambulatory Visit | Attending: Family Medicine

## 2024-11-05 VITALS — BP 142/98 | HR 68 | Ht 68.0 in | Wt 269.8 lb

## 2024-11-05 DIAGNOSIS — E118 Type 2 diabetes mellitus with unspecified complications: Secondary | ICD-10-CM

## 2024-11-05 DIAGNOSIS — Z955 Presence of coronary angioplasty implant and graft: Secondary | ICD-10-CM | POA: Insufficient documentation

## 2024-11-05 DIAGNOSIS — I10 Essential (primary) hypertension: Secondary | ICD-10-CM | POA: Diagnosis not present

## 2024-11-05 DIAGNOSIS — Z7902 Long term (current) use of antithrombotics/antiplatelets: Secondary | ICD-10-CM | POA: Insufficient documentation

## 2024-11-05 DIAGNOSIS — E119 Type 2 diabetes mellitus without complications: Secondary | ICD-10-CM | POA: Insufficient documentation

## 2024-11-05 DIAGNOSIS — I252 Old myocardial infarction: Secondary | ICD-10-CM | POA: Insufficient documentation

## 2024-11-05 DIAGNOSIS — I5022 Chronic systolic (congestive) heart failure: Secondary | ICD-10-CM | POA: Insufficient documentation

## 2024-11-05 DIAGNOSIS — I2510 Atherosclerotic heart disease of native coronary artery without angina pectoris: Secondary | ICD-10-CM | POA: Insufficient documentation

## 2024-11-05 DIAGNOSIS — M1710 Unilateral primary osteoarthritis, unspecified knee: Secondary | ICD-10-CM | POA: Insufficient documentation

## 2024-11-05 DIAGNOSIS — Z8673 Personal history of transient ischemic attack (TIA), and cerebral infarction without residual deficits: Secondary | ICD-10-CM | POA: Insufficient documentation

## 2024-11-05 DIAGNOSIS — E785 Hyperlipidemia, unspecified: Secondary | ICD-10-CM

## 2024-11-05 DIAGNOSIS — R918 Other nonspecific abnormal finding of lung field: Secondary | ICD-10-CM | POA: Insufficient documentation

## 2024-11-05 DIAGNOSIS — I255 Ischemic cardiomyopathy: Secondary | ICD-10-CM | POA: Insufficient documentation

## 2024-11-05 DIAGNOSIS — I110 Hypertensive heart disease with heart failure: Secondary | ICD-10-CM | POA: Insufficient documentation

## 2024-11-05 DIAGNOSIS — Z7984 Long term (current) use of oral hypoglycemic drugs: Secondary | ICD-10-CM | POA: Insufficient documentation

## 2024-11-05 DIAGNOSIS — Z7982 Long term (current) use of aspirin: Secondary | ICD-10-CM | POA: Insufficient documentation

## 2024-11-05 DIAGNOSIS — R0683 Snoring: Secondary | ICD-10-CM | POA: Insufficient documentation

## 2024-11-05 DIAGNOSIS — Z79899 Other long term (current) drug therapy: Secondary | ICD-10-CM | POA: Insufficient documentation

## 2024-11-05 NOTE — Patient Instructions (Addendum)
 Thank you for coming in today  If you had labs drawn today, any labs that are abnormal the clinic will call you No news is good news  PLEASE CALL [phone] DR. THUKKANI'S OFFICE TO MAKE A APPOINTMENT   Medications: No changes  Follow up appointments:  Your physician recommends that you schedule a follow-up appointment in:  6 months with Dr. Zenaida with echocardiogram  Please call our office to schedule the follow-up appointment in March/April 2025 for June 2026 .    Do the following things EVERYDAY: Weigh yourself in the morning before breakfast. Write it down and keep it in a log. Take your medicines as prescribed Eat low salt foods--Limit salt (sodium) to 2000 mg per day.  Stay as active as you can everyday Limit all fluids for the day to less than 2 liters   At the [facility], you and your health needs are our priority. As part of our continuing mission to provide you with exceptional heart care, we have created designated Provider Care Teams. These Care Teams include your primary Cardiologist (physician) and Advanced Practice Providers (APPs- Physician Assistants and Nurse Practitioners) who all work together to provide you with the care you need, when you need it.   You may see any of the following providers on your designated Care Team at your next follow up: Dr Toribio Fuel Dr Ezra Shuck Dr. Ria Gardenia Greig Lenetta, NP Caffie Shed, [place] [facility] [place], [place] Beckey Coe, NP Tinnie Redman, PharmD   Please be sure to bring in all your medications bottles to every appointment.    Thank you for choosing [facility]  If you have any questions or concerns before your next appointment please send us  a message through [place] or call our office at [phone].    TO LEAVE A MESSAGE FOR THE NURSE SELECT OPTION 2, PLEASE LEAVE A MESSAGE INCLUDING: YOUR NAME DATE OF BIRTH CALL BACK  NUMBER REASON FOR CALL**this is important as we prioritize the call backs  YOU WILL RECEIVE A CALL BACK THE SAME DAY AS LONG AS YOU CALL BEFORE 4:00 PM

## 2024-11-06 ENCOUNTER — Other Ambulatory Visit: Payer: Self-pay | Admitting: Pharmacist

## 2024-11-06 NOTE — Progress Notes (Signed)
 Pharmacy Quality Measure Review  This patient is appearing on a report for being at risk of failing the adherence measure for cholesterol (statin) medications this calendar year.   Medication: atorvastatin  Last fill date: 11/05/2024 for 90 day supply  Fills now up to date. No follow-up needed at this time.   Herlene Fleeta Morris, PharmD, JAQUELINE, CPP Clinical Pharmacist [facility] [phone]

## 2024-12-10 NOTE — Progress Notes (Unsigned)
 " Cardiology Office Note:    Date:  12/11/2024   ID:  Jacob Gonzales, DOB [birth year], MRN [id number]  PCP:  Celestia Rosaline SQUIBB, NP   [facility] Providers Cardiologist:  Lurena MARLA Red, MD     Referring MD: Celestia Rosaline SQUIBB, NP   Chief complaint: Follow-up for chronic cardiovascular conditions     History of Present Illness:   Jacob Gonzales is a 69 y.o. male with a hx of ACS (NSTEMI in 2022, STEMI in 2024), combined systolic and diastolic heart failure, T2DM, HTN, HLD, aortic atherosclerosis presenting today for follow-up of chronic cardiovascular conditions.  Evaluated by Dr. Okey in July 2020 for syncope, workup was negative with echo revealing normal EF.  He presented to the ED on 01/15/2021 with sudden onset left-sided chest pressure and tightness with shortness of breath and nausea awaking him from sleep at 3 AM.  CT angiogram showing scattered pulmonary nodules throughout lungs.  Echo  demonstrated LVEF 40-45%, mildly decreased LV function, global hypokinesis, moderate LVH, G1 DD, normal RV, LA mildly dilated, trivial pericardial effusion, trivial MV regurg.  LHC demonstrating second OM lesion 99% stenosed with PCI/DES, nonobstructive disease noted in remaining coronary arteries.  DAPT ordered with ASA and Brilinta .  Patient presented to the hospital as a field initiated STEMI in May 2024.  LHC on 04/11/2023 showing ostial LAD occlusion treated with POBA.  This revealed a high-grade distal LM, ostial LAD, ostial D1, ostial ramus disease, with moderate disease in LCx.  Surgical revascularization was considered, EDP 31 mmHg.  Echo on 04/12/2023: LVEF 25-30%, severely decreased LV function, RWMA present with akinesis of the mid-- apical anterior, anteroseptal, and inferoseptal walls consistent with LAD territory infarction.  Mild concentric LVH, G1 DD, small pericardial effusion present.  See MRI on 04/13/2023: Apparent scar >50% of myocardial thickness, suggesting nonviable myocardium in  the LAD territory, however the degree of scar may be overestimated in the setting of acute MI.  LVEF 33%, RVEF 48%.  Given large territory of nonviable territory, plans for CABG were aborted, PCI was pursued.  Normal RHC on 04/13/2023, CO 6.5, CI 2.7.  IVUS guided PCI of distal left main into LCx using Cutting Balloon and coronary lithotripsy performed on 04/14/2023, a 4.5 X 20 mm Medtronic stent was placed, LVEDP 25 mmHg.  Discharged on Entresto , Farxiga , metoprolol  XL, spironolactone , aspirin , Brilinta , atorvastatin .  Patient was seen in follow-up with advanced heart failure clinic, Spiro was initiated.  Most recent echocardiogram on file from 05/06/2024: LVEF 35-40%, akinesis of distal anteroseptal wall, distal inferior wall and apex, overall moderate LV dysfunction, G1 DD, normal MV, normal AV.  Most recent follow-up with cardiology was with the advanced heart failure clinic on 11/05/2024, at that time patient was doing well.   Presents with his son to clinic today, appears stable from a cardiovascular standpoint. He denies chest pain, palpitations, dyspnea, orthopnea, n, v,  dark/tarry/bloody stools, hematuria, syncope, edema, weight gain.  He does note episodes of orthostasis and lightheadedness with positional changes at times, occurring multiple times during the week.  Was recently started on Mounjaro , has lost around 10 pounds in the last month.  Works a physical job.  Reports that his stamina has not returned to where it was prior to the STEMI, however he feels it has been stable over the last year, not declining.  Reports his blood pressure today is prior to taking any morning medications (102/60).   ROS:   Please see the history of present illness.  All other systems reviewed and are negative.     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   Arthritis    Bilateral shoulders, Hands, Knees   Bone cancer (HCC) 2016    Brain cancer (HCC) 2016   Diabetes (HCC) 11/17/2015   HTN (hypertension) 11/17/2015   NSTEMI (non-ST elevated myocardial infarction) [facility])     Past Surgical History:  Procedure Laterality Date   CORONARY BALLOON ANGIOPLASTY N/A 04/11/2023   Procedure: CORONARY BALLOON ANGIOPLASTY;  Surgeon: Wendel Lurena POUR, MD;  Location: [facility] INVASIVE CV LAB;  Service: Cardiovascular;  Laterality: N/A;   CORONARY LITHOTRIPSY N/A 04/14/2023   Procedure: CORONARY LITHOTRIPSY;  Surgeon: Wendel Lurena POUR, MD;  Location: [facility] INVASIVE CV LAB;  Service: Cardiovascular;  Laterality: N/A;   CORONARY STENT INTERVENTION N/A 01/15/2021   Procedure: CORONARY STENT INTERVENTION;  Surgeon: Dann Candyce RAMAN, MD;  Location: [facility] INVASIVE CV LAB;  Service: Cardiovascular;  Laterality: N/A;   CORONARY STENT INTERVENTION N/A 04/14/2023   Procedure: CORONARY STENT INTERVENTION;  Surgeon: Wendel Lurena POUR, MD;  Location: [facility] INVASIVE CV LAB;  Service: Cardiovascular;  Laterality: N/A;   CORONARY ULTRASOUND/IVUS N/A 01/15/2021   Procedure: Intravascular Ultrasound/IVUS;  Surgeon: Dann Candyce RAMAN, MD;  Location: [facility] INVASIVE CV LAB;  Service: Cardiovascular;  Laterality: N/A;   CORONARY ULTRASOUND/IVUS N/A 04/14/2023   Procedure: Coronary Ultrasound/IVUS;  Surgeon: Wendel Lurena POUR, MD;  Location: [facility] INVASIVE CV LAB;  Service: Cardiovascular;  Laterality: N/A;   CORONARY/GRAFT ACUTE MI REVASCULARIZATION N/A 04/11/2023   Procedure: Coronary/Graft Acute MI Revascularization;  Surgeon: Wendel Lurena POUR, MD;  Location: [facility] INVASIVE CV LAB;  Service: Cardiovascular;  Laterality: N/A;   LEFT HEART CATH AND CORONARY ANGIOGRAPHY N/A 01/15/2021   Procedure: LEFT HEART CATH AND CORONARY ANGIOGRAPHY;  Surgeon: Dann Candyce RAMAN, MD;  Location: [facility] INVASIVE CV LAB;  Service: Cardiovascular;  Laterality: N/A;   LEFT HEART CATH AND CORONARY ANGIOGRAPHY N/A 04/11/2023   Procedure: LEFT HEART CATH AND CORONARY ANGIOGRAPHY;  Surgeon: Wendel Lurena POUR, MD;   Location: [facility] INVASIVE CV LAB;  Service: Cardiovascular;  Laterality: N/A;   NO PAST SURGERIES     RIGHT HEART CATH N/A 04/13/2023   Procedure: RIGHT HEART CATH;  Surgeon: Cherrie Toribio SAUNDERS, MD;  Location: [facility] INVASIVE CV LAB;  Service: Cardiovascular;  Laterality: N/A;    Current Medications: Active Medications[1]   Allergies:   Patient has no known allergies.   Social History   Socioeconomic History   Marital status: Married    Spouse name: Not on file   Number of children: 3   Years of education: Not on file   Highest education level: Not on file  Occupational History   Not on file  Tobacco Use   Smoking status: Never   Smokeless tobacco: Never  Vaping Use   Vaping status: Never Used  Substance and Sexual Activity   Alcohol use: Yes   Drug use: No   Sexual activity: Not on file  Other Topics Concern   Not on file  Social History Narrative   Lives at home with wife   Caffeine use: Drinks soda (2-12packs per week)   Social Drivers of Health   Tobacco Use: Low Risk (12/11/2024)   Patient History    Smoking Tobacco Use: Never    Smokeless Tobacco Use: Never    Passive Exposure: Not on file  Financial Resource Strain: Not on file  Food Insecurity: Food Insecurity Present (04/17/2023)  Hunger Vital Sign    Worried About Running Out of Food in the Last Year: Often true    Ran Out of Food in the Last Year: Often true  Transportation Needs: No Transportation Needs (04/13/2023)   PRAPARE - Administrator, Civil Service (Medical): No    Lack of Transportation (Non-Medical): No  Physical Activity: Not on file  Stress: Not on file  Social Connections: Not on file  Depression (PHQ2-9): Low Risk (02/05/2024)   Depression (PHQ2-9)    PHQ-2 Score: 0  Recent Concern: Depression (PHQ2-9) - High Risk (11/20/2023)   Depression (PHQ2-9)    PHQ-2 Score: 21  Alcohol Screen: Not on file  Housing: Low Risk (04/17/2023)   Housing    Last Housing Risk Score: 0  Utilities:  Not At Risk (04/14/2023)   AHC Utilities    Threatened with loss of utilities: No  Health Literacy: Not on file     Family History: The patient's family history includes Diabetes Mellitus II in his father; Heart attack in his brother, father, maternal aunt, maternal uncle, mother, paternal aunt, and paternal uncle; Kidney failure in his father; Uterine cancer in his sister. There is no history of Stroke.  EKGs/Labs/Other Studies Reviewed:    The following studies were reviewed today:  EKG Interpretation Date/Time:  Wednesday December 11 2024 08:58:44 EST Ventricular Rate:  83 PR Interval:  170 QRS Duration:  82 QT Interval:  376 QTC Calculation: 441 R Axis:   -36  Text Interpretation: Normal sinus rhythm Left axis deviation Low voltage QRS Inferior infarct , age undetermined Cannot rule out Anteroseptal infarct (cited on or before 11-Apr-2023) When compared with ECG of 31-Dec-2023 07:40, QRS axis Shifted left Inferior infarct is now Present QT has shortened Confirmed by Aleisha Paone [phone]) on 12/11/2024 9:06:18 AM    Recent Labs: 12/31/2023: Hemoglobin 17.1; Platelets 328 05/07/2024: ALT 24 08/06/2024: B Natriuretic Peptide 84.9; BUN 14; Creatinine, Ser 0.89; Potassium 4.0; Sodium 137  Recent Lipid Panel    Component Value Date/Time   CHOL 118 05/07/2024 1037   TRIG 97 05/07/2024 1037   HDL 40 05/07/2024 1037   CHOLHDL 3.0 05/07/2024 1037   CHOLHDL 7.8 04/11/2023 1650   VLDL 22 04/11/2023 1650   LDLCALC 60 05/07/2024 1037     Risk Assessment/Calculations:                Physical Exam:    VS:  BP 102/60   Pulse 82   Ht 5' 8 (1.727 m)   Wt 255 lb (115.7 kg)   SpO2 94%   BMI 38.77 kg/m        Wt Readings from Last 3 Encounters:  12/11/24 255 lb (115.7 kg)  11/05/24 269 lb 12.8 oz (122.4 kg)  08/20/24 264 lb 3.2 oz (119.8 kg)     GEN: Well nourished, well developed in no acute distress HEENT: Normal NECK: No JVD; No carotid bruits CARDIAC:  S1-S2 normal,  RRR, no murmurs, rubs, gallops RESPIRATORY:  Clear to auscultation without rales, wheezing or rhonchi  MUSCULOSKELETAL:  No edema; No deformity  SKIN: Warm and dry NEUROLOGIC:  Alert and oriented x 3 PSYCHIATRIC:  Normal affect       Assessment & Plan HFrEF (heart failure with reduced ejection fraction) (HCC) 05/06/2024: LVEF 35-40%, akinesis of distal anteroseptal wall, distal inferior wall and apex, overall moderate LV dysfunction, G1 DD, normal MV, normal AV AHFC following, last visit 12/2, planned for repeat echo at follow up in June  He does report orthostatic lightheadedness with positional changes, resolves shortly after it starts, occurring multiple times a week.  Blood pressure today is reflective of no morning medications having been taken. Weight is down 14 pounds from visit 1 month prior, patient states he has lost around 10-12 pounds on his scales at home.  Recently started on Mounjaro  around a month ago, not eating as much as he used to, works a physical job. Denies fatigue, activity intolerance, orthopnea, shortness of breath, near-syncope Appears to be euvolemic Will half spironolactone  to 12.5 mg daily, with hold parameters if blood pressure drops <100 systolic Continue Farxiga  10 mg daily, continue Lasix  40 mg as needed swelling, continue Toprol -XL 25 mg daily, continue Entresto  97-103 twice daily Advised to keep a blood pressure log and notify office if pressure drops <90 systolic ED precautions given Will plan to reduce Entresto  by half if blood pressure/symptoms have not improved by next visit, plan for close follow-up Recheck BMP today CAD in native artery NSTEMI in 2022, STEMI in 2024 [facility] 04/11/2023: Worsening multivessel CAD, CABG considered, see MRI demonstrating large area of scarring, PCI pursued.  IVUS guided PCI of distal LM into LCx using Cutting Balloon coronary lithotripsy, 4.5 X20 Medtronic stent placed Normal RHC 04/13/2023, CO 6.5, CI 2.7. He denies chest pain,  shortness of breath, near-syncope, palpitations Has not needed his nitroglycerin  Continue aspirin  EC 81 mg daily, will seek to clarify DAPT recommendations with Dr. Wendel. Continue atorvastatin  80 mg daily Continue Brilinta  90 mg twice daily Continue Toprol -XL 25 mg daily Continue nitroglycerin  0.4 mg SL tab PRN chest pain every 5 minutes Recheck CBC for drug monitoring Monitor for new signs and symptoms Hypertension associated with diabetes (HCC) Patient does not monitor BP at home Advised him to keep a BP log and report any pressures <90 systolic to the office, hold parameters given on spironolactone  as above. ED precautions given. Will have close follow up for further med titration Continue Entresto , Toprol -XL, Lasix , Farxiga , spironolactone  as above Hyperlipidemia associated with type 2 diabetes mellitus (HCC) Diabetic control per PCP Snoring Will need to be addressed at next visit for possible referral to pulmonology for sleep study  Disposition: Follow up in 2 weeks for med titration, proceed to the ED with any new/worsening symptoms.             Medication Adjustments/Labs and Tests Ordered: Current medicines are reviewed at length with the patient today.  Concerns regarding medicines are outlined above.  Orders Placed This Encounter  Procedures   Basic metabolic panel with GFR   CBC   Basic metabolic panel with GFR   EKG 87-Ozji   Meds ordered this encounter  Medications   spironolactone  (ALDACTONE ) 25 MG tablet    Sig: Take 0.5 tablets (12.5 mg total) by mouth daily.   metoprolol  succinate (TOPROL -XL) 25 MG 24 hr tablet    Sig: TAKE 1 TABLET AT BEDTIME    Dispense:  30 tablet    Refill:  1    Patient Instructions  Thank you for choosing [facility]!     Medication Instructions:  Monitor your blood pressure daily. Record readings on blood pressure log provided. HOLD THE SPIRONOLACTONE  if the blood pressure systolic number is less than 100.   Upload the log to your mychart if the systolic pressure is in the 90's.  Decrease the Spirolactone from 25mg  to 12.5mg . Take one tablet daily.  Take the Metoprolol  at bedtime.  *If you need a refill on your cardiac  medications before your next appointment, please call your pharmacy*   Lab Work: BMET, CBC  Return IN 2 WEEKS for labs....................... BMET  If you have labs (blood work) drawn today and your tests are completely normal, you will receive your results only by: MyChart Message (if you have MyChart) OR A paper copy in the mail If you have any lab test that is abnormal or we need to change your treatment, we will call you to review the results.   Testing/Procedures: No procedures were ordered during today's visit.   Your next appointment:   2 week(s)   Provider:   Miriam Shams, NP           OUR FAX NUMBER IS [phone] At [facility], you and your health needs are our priority.  As part of our continuing mission to provide you with exceptional heart care, we have created designated Provider Care Teams.  These Care Teams include your primary Cardiologist (physician) and Advanced Practice Providers (APPs -  Physician Assistants and Nurse Practitioners) who all work together to provide you with the care you need, when you need it. We recommend signing up for the patient portal called MyChart.  Sign up information is provided on this After Visit Summary.  MyChart is used to connect with patients for Virtual Visits (Telemedicine).  Patients are able to view lab/test results, encounter notes, upcoming appointments, etc.  Non-urgent messages can be sent to your provider as well.   To learn more about what you can do with MyChart, go to [email or web address].      Signed, Vear Staton E Karine Garn, NP  12/11/2024 1:00 PM    [facility]     [1]  Current Meds  Medication Sig   aspirin  81 MG chewable tablet Chew 1 tablet (81 mg total) by mouth  daily.   atorvastatin  (LIPITOR ) 80 MG tablet Take 1 tablet (80 mg total) by mouth daily.   BRILINTA  90 MG TABS tablet TAKE ONE TABLET BY MOUTH TWICE DAILY   ezetimibe  (ZETIA ) 10 MG tablet Take 1 tablet (10 mg total) by mouth daily.   FARXIGA  10 MG TABS tablet Take 1 tablet (10 mg total) by mouth daily.   furosemide  (LASIX ) 40 MG tablet Take 1 tablet (40 mg total) by mouth as needed.   Misc. Devices (DIGITAL GLASS SCALE) MISC UAD to monitor weight daily Dx: I50.9   nitroGLYCERIN  (NITROSTAT ) 0.4 MG SL tablet Place 1 tablet (0.4 mg total) under the tongue every 5 (five) minutes as needed for chest pain or shortness of breath.   sacubitril -valsartan  (ENTRESTO ) 97-103 MG Take 1 tablet by mouth 2 (two) times daily.   tamsulosin (FLOMAX) 0.4 MG CAPS capsule Take 0.4 mg by mouth daily after supper.   tirzepatide  (MOUNJARO ) 2.5 MG/0.5ML Pen Inject 2.5 mg into the skin once a week.   [DISCONTINUED] metoprolol  succinate (TOPROL -XL) 25 MG 24 hr tablet Take 1 tablet (25 mg total) by mouth daily.   [DISCONTINUED] spironolactone  (ALDACTONE ) 25 MG tablet TAKE 1 TABLET (25 MG TOTAL) BY MOUTH DAILY.   "

## 2024-12-11 ENCOUNTER — Ambulatory Visit: Attending: Cardiovascular Disease | Admitting: Emergency Medicine

## 2024-12-11 ENCOUNTER — Encounter: Payer: Self-pay | Admitting: Emergency Medicine

## 2024-12-11 VITALS — BP 102/60 | HR 82 | Ht 68.0 in | Wt 255.0 lb

## 2024-12-11 DIAGNOSIS — E785 Hyperlipidemia, unspecified: Secondary | ICD-10-CM

## 2024-12-11 DIAGNOSIS — I152 Hypertension secondary to endocrine disorders: Secondary | ICD-10-CM

## 2024-12-11 DIAGNOSIS — E1159 Type 2 diabetes mellitus with other circulatory complications: Secondary | ICD-10-CM

## 2024-12-11 DIAGNOSIS — I2510 Atherosclerotic heart disease of native coronary artery without angina pectoris: Secondary | ICD-10-CM

## 2024-12-11 DIAGNOSIS — R0683 Snoring: Secondary | ICD-10-CM | POA: Diagnosis not present

## 2024-12-11 DIAGNOSIS — I5022 Chronic systolic (congestive) heart failure: Secondary | ICD-10-CM

## 2024-12-11 DIAGNOSIS — I5020 Unspecified systolic (congestive) heart failure: Secondary | ICD-10-CM

## 2024-12-11 DIAGNOSIS — E1169 Type 2 diabetes mellitus with other specified complication: Secondary | ICD-10-CM | POA: Diagnosis not present

## 2024-12-11 LAB — CBC
Hematocrit: 49.6 % (ref 37.5–51.0)
Hemoglobin: 16.3 g/dL (ref 13.0–17.7)
MCH: 31.6 pg (ref 26.6–33.0)
MCHC: 32.9 g/dL (ref 31.5–35.7)
MCV: 96 fL (ref 79–97)
Platelets: 308 x10E3/uL (ref 150–450)
RBC: 5.16 x10E6/uL (ref 4.14–5.80)
RDW: 14.2 % (ref 11.6–15.4)
WBC: 11.5 x10E3/uL — ABNORMAL HIGH (ref 3.4–10.8)

## 2024-12-11 LAB — BASIC METABOLIC PANEL WITH GFR
BUN/Creatinine Ratio: 17 (ref 10–24)
BUN: 20 mg/dL (ref 8–27)
CO2: 22 mmol/L (ref 20–29)
Calcium: 9.3 mg/dL (ref 8.6–10.2)
Chloride: 104 mmol/L (ref 96–106)
Creatinine, Ser: 1.19 mg/dL (ref 0.76–1.27)
Glucose: 95 mg/dL (ref 70–99)
Potassium: 5 mmol/L (ref 3.5–5.2)
Sodium: 139 mmol/L (ref 134–144)
eGFR: 67 mL/min/1.73

## 2024-12-11 MED ORDER — METOPROLOL SUCCINATE ER 25 MG PO TB24: ORAL_TABLET | ORAL | 1 refills | Status: AC

## 2024-12-11 MED ORDER — SPIRONOLACTONE 25 MG PO TABS: 12.5000 mg | ORAL_TABLET | Freq: Every day | ORAL | Status: AC

## 2024-12-11 NOTE — Patient Instructions (Addendum)
 Thank you for choosing [facility]!     Medication Instructions:  Monitor your blood pressure daily. Record readings on blood pressure log provided. HOLD THE SPIRONOLACTONE  if the blood pressure systolic number is less than 100.  Upload the log to your mychart if the systolic pressure is in the 90's.  Decrease the Spirolactone from 25mg  to 12.5mg . Take one tablet daily.  Take the Metoprolol  at bedtime.  *If you need a refill on your cardiac medications before your next appointment, please call your pharmacy*   Lab Work: BMET, CBC  Return IN 2 WEEKS for labs....................... BMET  If you have labs (blood work) drawn today and your tests are completely normal, you will receive your results only by: MyChart Message (if you have MyChart) OR A paper copy in the mail If you have any lab test that is abnormal or we need to change your treatment, we will call you to review the results.   Testing/Procedures: No procedures were ordered during today's visit.   Your next appointment:   2 week(s)   Provider:   Miriam Shams, NP           OUR FAX NUMBER IS [phone] At [facility], you and your health needs are our priority.  As part of our continuing mission to provide you with exceptional heart care, we have created designated Provider Care Teams.  These Care Teams include your primary Cardiologist (physician) and Advanced Practice Providers (APPs -  Physician Assistants and Nurse Practitioners) who all work together to provide you with the care you need, when you need it. We recommend signing up for the patient portal called MyChart.  Sign up information is provided on this After Visit Summary.  MyChart is used to connect with patients for Virtual Visits (Telemedicine).  Patients are able to view lab/test results, encounter notes, upcoming appointments, etc.  Non-urgent messages can be sent to your provider as well.   To learn more about what you can do  with MyChart, go to [email or web address].

## 2024-12-12 ENCOUNTER — Other Ambulatory Visit (HOSPITAL_COMMUNITY): Payer: Self-pay | Admitting: Internal Medicine

## 2024-12-12 ENCOUNTER — Other Ambulatory Visit (HOSPITAL_COMMUNITY): Payer: Self-pay

## 2024-12-12 ENCOUNTER — Ambulatory Visit: Payer: Self-pay | Admitting: Emergency Medicine

## 2024-12-12 ENCOUNTER — Telehealth: Payer: Self-pay

## 2024-12-12 ENCOUNTER — Telehealth (HOSPITAL_BASED_OUTPATIENT_CLINIC_OR_DEPARTMENT_OTHER): Payer: Self-pay

## 2024-12-12 DIAGNOSIS — I5022 Chronic systolic (congestive) heart failure: Secondary | ICD-10-CM

## 2024-12-12 MED ORDER — SACUBITRIL-VALSARTAN 97-103 MG PO TABS: 1.0000 | ORAL_TABLET | Freq: Two times a day (BID) | ORAL | 11 refills | Status: AC

## 2024-12-12 NOTE — Telephone Encounter (Signed)
"  ° °  Pre-operative Risk Assessment    Patient Name: Jacob Gonzales  DOB: 19-Jul-1956 MRN: [id number]   Date of last office visit: 12/11/24 with Elaine Date of next office visit: NA  Request for Surgical Clearance    Procedure:  Dental Extraction - Amount of Teeth to be Pulled:  3 surgical   Date of Surgery:  Clearance TBD                                 Surgeon:  not indicatted Surgeon's Group or Practice Name:  [facility]  Phone number:  [phone] Fax number:  [phone]   Type of Clearance Requested:   - Medical  - Pharmacy:  Hold Aspirin  and Ticagrelor  (Brilinta ) not indicated   Type of Anesthesia:  Local    Additional requests/questions:    Bonney Augustin JONETTA Delores   12/12/2024, 10:16 AM   "

## 2024-12-12 NOTE — Telephone Encounter (Signed)
 Left patient a detailed message, ok per DPR, to stop asa. Advised to call back or send a MyChart message with any questions or concerns.

## 2024-12-12 NOTE — Telephone Encounter (Signed)
-----   Message from Miriam Shams, NP sent at 12/11/2024  5:46 PM EST ----- Please call the patient and let him know I've spoken w/ Dr. Wendel who said he no longer needs to take aspirin  daily. He only needs to continue to take the Brilinta  twice daily as prescribed (should be 90 mg twice daily).  Let me know if you have any questions, thank you! Kenzie E Campbell, NP ----- Message ----- From: Thukkani, Arun K, MD Sent: 12/11/2024   1:23 PM EST To: Kenzie E Campbell, NP  Yes he is  more than a year out from his stent ----- Message ----- From: Campbell, Kenzie E, NP Sent: 12/11/2024   1:07 PM EST To: Arun K Thukkani, MD  Hey Dr. Wendel,  I saw in your 02/12/2024 note you mentioned the patient should be on Brilinta  monotherapy indefinitely.  The patient is currently still on DAPT with 81 mg of aspirin  daily and 90 mg of Brilinta  twice daily.  I wanted to clarify whether you would prefer he continue this regimen, or follow your instructions from the 02/12/2024 note.  Thanks for your consideration, Kenzie E Campbell, NP

## 2024-12-13 NOTE — Telephone Encounter (Signed)
 Patient is able to complete >4 METS of activity.  RCRI score 2, indicating a 5% risk for major cardiac events. Therefore, based on ACC/AHA guidelines, patient would be at moderate but acceptable risk for the planned procedure without further cardiovascular testing provided the procedure will be performed under local anesthesia.

## 2024-12-16 NOTE — Telephone Encounter (Signed)
"  ° °  Patient Name: Jacob Gonzales  DOB: [birth year] MRN: [id number]  Primary Cardiologist: Arun K Thukkani, MD  Chart reviewed as part of pre-operative protocol coverage. Given past medical history and time since last visit, based on ACC/AHA guidelines, [name] is at acceptable risk for the planned procedure without further cardiovascular testing.   Per Miriam Shams, NP 12/14/2023 Patient is able to complete >4 METS of activity.  RCRI score 2, indicating a 5% risk for major cardiac events. Therefore, based on ACC/AHA guidelines, patient would be at moderate but acceptable risk for the planned procedure without further cardiovascular testing provided the procedure will be performed under local anesthesia.     Per office protocol, if patient is without any new symptoms or concerns at the time of their virtual visit, he may hold Brilinta  for 5 days prior to procedure. He may hold aspirin  7 days prior  to procedure. Please resume both aspirin  and Brilinta  as soon as possible postprocedure, at the discretion of the surgeon.    He does not need SBE prophylaxis.  The patient was advised that if he develops new symptoms prior to surgery to contact our office to arrange for a follow-up visit, and he verbalized understanding.  I will route this recommendation to the requesting party via Epic fax function and remove from pre-op pool.  Please call with questions.  Lamarr Satterfield, NP 12/16/2024, 7:46 AM  "

## 2024-12-19 ENCOUNTER — Other Ambulatory Visit: Payer: Self-pay | Admitting: Pharmacist

## 2024-12-19 NOTE — Progress Notes (Signed)
 Pharmacy Quality Measure Review  This patient is appearing on a report for being at risk of failing the adherence measure for cholesterol (statin) medications this calendar year.   Medication: atorvastatin  Last fill date: 11/05/2024 for 90 day supply  Fills now up to date. No follow-up needed at this time.   Herlene Fleeta Morris, PharmD, JAQUELINE, CPP Clinical Pharmacist [facility] [phone]

## 2024-12-20 NOTE — Telephone Encounter (Signed)
 Contacted the patient to verify that he got my message about discontinuing the aspirin . Patient states he received my message and has stopped the medication. Med list updated.

## 2024-12-23 ENCOUNTER — Other Ambulatory Visit: Payer: Self-pay | Admitting: Internal Medicine

## 2024-12-31 ENCOUNTER — Telehealth: Payer: Self-pay | Admitting: Emergency Medicine

## 2024-12-31 NOTE — Telephone Encounter (Signed)
 Called pt to sch 2wk f/u with Kenzie Campbell per staff message. Pt states he is unable to leave his house due to weather. Advised he will call us  back to sch once able to travel again.

## 2025-01-09 ENCOUNTER — Other Ambulatory Visit: Payer: Self-pay

## 2025-01-09 ENCOUNTER — Ambulatory Visit: Admitting: Orthopaedic Surgery

## 2025-01-09 VITALS — Wt 255.0 lb

## 2025-01-09 DIAGNOSIS — M1711 Unilateral primary osteoarthritis, right knee: Secondary | ICD-10-CM

## 2025-01-09 DIAGNOSIS — M1712 Unilateral primary osteoarthritis, left knee: Secondary | ICD-10-CM

## 2025-01-09 LAB — POCT GLYCOSYLATED HEMOGLOBIN (HGB A1C): Hemoglobin A1C: 6.1 % — AB (ref 4.0–5.6)

## 2025-01-09 NOTE — Progress Notes (Signed)
 "  Office Visit Note   Patient: Jacob Gonzales           Date of Birth: 04-14-1956           MRN: [id number] Visit Date: 01/09/2025              Requested by: Jacob Rosaline SQUIBB, NP [address] [place],  [place] [address] PCP: Jacob Rosaline SQUIBB, NP   Assessment & Plan: Visit Diagnoses:  1. Primary osteoarthritis of right knee   2. Primary osteoarthritis of left knee     Plan: Impression is severe right knee degenerative joint disease secondary to Osteoarthritis.  Patient has attempted conservative treatment for at least 6 consecutive weeks within the past 12 weeks, including but not limited to physical therapy, home exercise program, NSAIDs, activity modification, and/or corticosteroid injections. Despite these efforts, symptoms have not improved or have worsened. Conservative measures have been deemed unsuccessful at this time. After a detailed discussion covering diagnosis and treatment options--including the risks, benefits, alternatives, and potential complications of surgical and nonsurgical management--the patient elected to proceed with surgery  Anticoagulants: No antithrombotic Postop anticoagulation: Eliquis Diabetic: Yes  Nickel allergy: No Prior DVT/PE: No Tobacco use: No Clearances needed for surgery: PCP, cardiology Anticipated discharge dispo: Home   Follow-Up Instructions: Return if symptoms worsen or fail to improve.   Orders:  Orders Placed This Encounter  Procedures   XR KNEE 3 VIEW LEFT   XR KNEE 3 VIEW RIGHT   POCT HgB A1C   No orders of the defined types were placed in this encounter.     Procedures: No procedures performed   Clinical Data: No additional findings.   Subjective: Chief Complaint  Patient presents with   Right Knee - Follow-up   Left Knee - Follow-up    HPI Jacob Gonzales returns today for follow-up evaluation of bilateral severe knee pain.  The previous injections performed in September provided about 10 to 14 days of  relief.  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  He is having severe difficulty with ADLs. Review of Systems  Constitutional: Negative.   HENT: Negative.    Eyes: Negative.   Respiratory: Negative.    Cardiovascular: Negative.   Gastrointestinal: Negative.   Endocrine: Negative.   Genitourinary: Negative.   Skin: Negative.   Allergic/Immunologic: Negative.   Neurological: Negative.   Hematological: Negative.   Psychiatric/Behavioral: Negative.    All other systems reviewed and are negative.    Objective: Vital Signs: Wt 255 lb (115.7 kg)   BMI 38.77 kg/m   Physical Exam Vitals and nursing note reviewed.  Constitutional:      Appearance: He is well-developed.  HENT:     Head: Normocephalic and atraumatic.  Eyes:     Pupils: Pupils are equal, round, and reactive to light.  Pulmonary:     Effort: Pulmonary effort is normal.  Abdominal:     Palpations: Abdomen is soft.  Musculoskeletal:        General: Normal range of motion.     Cervical back: Neck supple.  Skin:    General: Skin is warm.  Neurological:     Mental Status: He is alert and oriented to person, place, and time.  Psychiatric:        Behavior: Behavior normal.        Thought Content: Thought content normal.        Judgment: Judgment normal.     Ortho Exam Examination of bilateral knees  show medial joint line tenderness.  Pain and crepitus throughout range of motion.  Collaterals and cruciates are stable. Specialty Comments:  No specialty comments available.  Imaging: XR KNEE 3 VIEW LEFT Result Date: 01/09/2025 X-rays of the left knee show advanced tricompartmental osteoarthritis.  Bone-on-bone joint space narrowing.  Kellgren-Lawrence stage IV.  Varus deformity  XR KNEE 3 VIEW RIGHT Result Date: 01/09/2025 X-rays demonstrate severe tricompartmental osteoarthritis.  Bone-on-bone joint space narrowing.  Kellgren-Lawrence stage IV.  Varus deformity    PMFS History: Patient Active Problem List    Diagnosis Date Noted   Morbid obesity (HCC) 09/11/2024   Knee pain 08/06/2024   Anticoagulant long-term use 12/04/2023   Pain due to onychomycosis of toenails of both feet 12/04/2023   Chronic systolic heart failure (HCC) 04/26/2023   Acute systolic heart failure (HCC) 04/13/2023   STEMI (ST elevation myocardial infarction) (HCC) 04/11/2023   Chest pain 01/15/2021   Pulmonary nodules 01/15/2021   NSTEMI (non-ST elevated myocardial infarction) [facility])    Elevated troponin    Lymphadenopathy    Other chest pain    Syncope 03/03/2019   Acute arterial ischemic stroke, vertebrobasilar, 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   Diabetes (HCC) 11/17/2015   HLD (hyperlipidemia) 11/17/2015   HTN (hypertension) 11/17/2015   Non compliance w medication regimen 11/17/2015   Cerebral infarction (HCC) 07/11/2015   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   Arthritis    Bilateral shoulders, Hands, Knees   Bone cancer (HCC) 2016   Brain cancer (HCC) 2016   Diabetes (HCC) 11/17/2015   HTN (hypertension) 11/17/2015   NSTEMI (non-ST elevated myocardial infarction) (HCC)     Family History  Problem Relation Age of Onset   Heart attack Mother    Heart attack Father    Diabetes Mellitus II Father    Kidney failure Father    Uterine cancer Sister    Heart attack Brother    Heart attack Maternal Aunt    Heart attack Maternal Uncle    Heart attack Paternal Aunt    Heart attack Paternal Uncle    Stroke Neg Hx     Past Surgical History:  Procedure Laterality Date   CORONARY BALLOON ANGIOPLASTY N/A 04/11/2023   Procedure: CORONARY BALLOON ANGIOPLASTY;  Surgeon: Jacob Lurena POUR, MD;  Location: [facility];  Service: Cardiovascular;  Laterality: N/A;   CORONARY LITHOTRIPSY N/A 04/14/2023   Procedure: CORONARY LITHOTRIPSY;  Surgeon: Jacob Lurena POUR, MD;  Location: [facility];  Service: Cardiovascular;  Laterality: N/A;    CORONARY STENT INTERVENTION N/A 01/15/2021   Procedure: CORONARY STENT INTERVENTION;  Surgeon: Jacob Candyce RAMAN, MD;  Location: [facility] INVASIVE CV LAB;  Service: Cardiovascular;  Laterality: N/A;   CORONARY STENT INTERVENTION N/A 04/14/2023   Procedure: CORONARY STENT INTERVENTION;  Surgeon: Jacob Lurena POUR, MD;  Location: [facility];  Service: Cardiovascular;  Laterality: N/A;   CORONARY ULTRASOUND/IVUS N/A 01/15/2021   Procedure: Intravascular Ultrasound/IVUS;  Surgeon: Jacob Candyce RAMAN, MD;  Location[facility] INVAS[facility] CV [facility];  Service: Cardiovascular;  Laterality: N/A;   CORONARY ULTRASOUND/IVUS N/A 04/14/2023   Procedure: Coronary Ultrasound/IVUS;  Surgeon: Jacob Lurena POUR, MD;  Location: [facility];  Service: Cardiovascular;  Laterality: N/A;   CORONARY/GRAFT ACUTE MI REVASCULARIZATION N/A 04/11/2023   Procedure: Coronary/Graft Acute MI Revascularization;  Surgeon: Jacob Lurena POUR, MD;  Location: [facility];  Service: Cardiovascular;  Laterality: N/A;   LEFT HEART CATH AND  CORONARY ANGIOGRAPHY N/A 01/15/2021   Procedure: LEFT HEART CATH AND CORONARY ANGIOGRAPHY;  Surgeon: Jacob Candyce RAMAN, MD;  Location: [facility] INVASIVE CV LAB;  Service: Cardiovascular;  Laterality: N/A;   LEFT HEART CATH AND CORONARY ANGIOGRAPHY N/A 04/11/2023   Procedure: LEFT HEART CATH AND CORONARY ANGIOGRAPHY;  Surgeon: Jacob Lurena POUR, MD;  Location: [facility];  Service: Cardiovascular;  Laterality: N/A;   NO PAST SURGERIES     RIGHT HEART CATH N/A 04/13/2023   Procedure: RIGHT HEART CATH;  Surgeon: Cherrie Toribio SAUNDERS, MD;  Location: [facility];  Service: Cardiovascular;  Laterality: N/A;   Social History   Occupational History   Not on file  Tobacco Use   Smoking status: Never   Smokeless tobacco: Never  Vaping Use   Vaping status: Never Used  Substance and Sexual Activity   Alcohol use: Yes   Drug use: No   Sexual activity: Not on file        "
